# Patient Record
Sex: Female | Born: 1965 | Race: Black or African American | Hispanic: No | Marital: Single | Smoking: Never smoker
Health system: Southern US, Community
[De-identification: ages and names within clinical notes are randomized; demographics above are authoritative.]

## PROBLEM LIST (undated history)

## (undated) DIAGNOSIS — U099 Post covid-19 condition, unspecified: Secondary | ICD-10-CM

## (undated) DIAGNOSIS — R519 Headache, unspecified: Secondary | ICD-10-CM

## (undated) DIAGNOSIS — G478 Other sleep disorders: Secondary | ICD-10-CM

## (undated) DIAGNOSIS — R053 Chronic cough: Secondary | ICD-10-CM

## (undated) DIAGNOSIS — R05 Cough: Secondary | ICD-10-CM

## (undated) DIAGNOSIS — G8929 Other chronic pain: Secondary | ICD-10-CM

## (undated) DIAGNOSIS — J189 Pneumonia, unspecified organism: Secondary | ICD-10-CM

## (undated) DIAGNOSIS — M797 Fibromyalgia: Secondary | ICD-10-CM

## (undated) DIAGNOSIS — U071 COVID-19: Secondary | ICD-10-CM

## (undated) DIAGNOSIS — M1990 Unspecified osteoarthritis, unspecified site: Secondary | ICD-10-CM

## (undated) DIAGNOSIS — F419 Anxiety disorder, unspecified: Secondary | ICD-10-CM

## (undated) DIAGNOSIS — I10 Essential (primary) hypertension: Secondary | ICD-10-CM

## (undated) DIAGNOSIS — R0600 Dyspnea, unspecified: Secondary | ICD-10-CM

## (undated) DIAGNOSIS — R202 Paresthesia of skin: Secondary | ICD-10-CM

## (undated) DIAGNOSIS — K219 Gastro-esophageal reflux disease without esophagitis: Secondary | ICD-10-CM

## (undated) DIAGNOSIS — G039 Meningitis, unspecified: Secondary | ICD-10-CM

## (undated) DIAGNOSIS — R531 Weakness: Secondary | ICD-10-CM

## (undated) DIAGNOSIS — R52 Pain, unspecified: Secondary | ICD-10-CM

## (undated) DIAGNOSIS — E079 Disorder of thyroid, unspecified: Secondary | ICD-10-CM

## (undated) DIAGNOSIS — R51 Headache: Secondary | ICD-10-CM

## (undated) HISTORY — PX: OTHER SURGICAL HISTORY: SHX169

## (undated) HISTORY — PX: NO PAST SURGERIES: SHX2092

---

## 1998-02-02 ENCOUNTER — Emergency Department (HOSPITAL_COMMUNITY): Admission: EM | Admit: 1998-02-02 | Discharge: 1998-02-02 | Payer: Self-pay | Admitting: Emergency Medicine

## 1999-11-09 ENCOUNTER — Encounter: Admission: RE | Admit: 1999-11-09 | Discharge: 1999-11-09 | Payer: Self-pay | Admitting: Family Medicine

## 1999-11-09 ENCOUNTER — Encounter: Payer: Self-pay | Admitting: Family Medicine

## 2000-08-20 ENCOUNTER — Inpatient Hospital Stay (HOSPITAL_COMMUNITY): Admission: EM | Admit: 2000-08-20 | Discharge: 2000-08-23 | Payer: Self-pay | Admitting: *Deleted

## 2001-04-23 DIAGNOSIS — G971 Other reaction to spinal and lumbar puncture: Secondary | ICD-10-CM

## 2001-04-23 HISTORY — DX: Other reaction to spinal and lumbar puncture: G97.1

## 2001-06-23 ENCOUNTER — Inpatient Hospital Stay (HOSPITAL_COMMUNITY): Admission: EM | Admit: 2001-06-23 | Discharge: 2001-06-26 | Payer: Self-pay | Admitting: Emergency Medicine

## 2001-06-23 ENCOUNTER — Encounter: Payer: Self-pay | Admitting: Emergency Medicine

## 2001-06-30 ENCOUNTER — Emergency Department (HOSPITAL_COMMUNITY): Admission: EM | Admit: 2001-06-30 | Discharge: 2001-06-30 | Payer: Self-pay

## 2001-12-17 ENCOUNTER — Other Ambulatory Visit: Admission: RE | Admit: 2001-12-17 | Discharge: 2001-12-17 | Payer: Self-pay | Admitting: Obstetrics and Gynecology

## 2003-07-30 ENCOUNTER — Encounter: Admission: RE | Admit: 2003-07-30 | Discharge: 2003-07-30 | Payer: Self-pay | Admitting: Family Medicine

## 2007-01-29 ENCOUNTER — Encounter: Admission: RE | Admit: 2007-01-29 | Discharge: 2007-01-29 | Payer: Self-pay | Admitting: Family Medicine

## 2008-06-30 ENCOUNTER — Encounter: Admission: RE | Admit: 2008-06-30 | Discharge: 2008-06-30 | Payer: Self-pay | Admitting: Family Medicine

## 2009-09-12 ENCOUNTER — Encounter: Admission: RE | Admit: 2009-09-12 | Discharge: 2009-09-12 | Payer: Self-pay | Admitting: Family Medicine

## 2010-05-14 ENCOUNTER — Encounter: Payer: Self-pay | Admitting: Family Medicine

## 2010-07-11 ENCOUNTER — Other Ambulatory Visit (HOSPITAL_COMMUNITY): Payer: Self-pay | Admitting: Family Medicine

## 2010-07-11 DIAGNOSIS — R140 Abdominal distension (gaseous): Secondary | ICD-10-CM

## 2010-07-12 ENCOUNTER — Ambulatory Visit (HOSPITAL_COMMUNITY)
Admission: RE | Admit: 2010-07-12 | Discharge: 2010-07-12 | Disposition: A | Discharge status: Home / Self Care | Payer: PRIVATE HEALTH INSURANCE | Source: Ambulatory Visit | Attending: Family Medicine | Admitting: Family Medicine

## 2010-07-12 DIAGNOSIS — R143 Flatulence: Secondary | ICD-10-CM | POA: Insufficient documentation

## 2010-07-12 DIAGNOSIS — K7689 Other specified diseases of liver: Secondary | ICD-10-CM | POA: Insufficient documentation

## 2010-07-12 DIAGNOSIS — R109 Unspecified abdominal pain: Secondary | ICD-10-CM | POA: Insufficient documentation

## 2010-07-12 DIAGNOSIS — R141 Gas pain: Secondary | ICD-10-CM | POA: Insufficient documentation

## 2010-07-12 DIAGNOSIS — R140 Abdominal distension (gaseous): Secondary | ICD-10-CM

## 2010-07-12 DIAGNOSIS — R142 Eructation: Secondary | ICD-10-CM | POA: Insufficient documentation

## 2010-09-08 NOTE — Discharge Summary (Signed)
[facility]  Patient:    Donna Norton, Donna Norton             MRN: [id number] Adm. Date:  [id number] Disc. Date: [id number] Attending:  Pamelia Hoit                           Discharge Summary  ADMITTING DIAGNOSES: 1. Probable viral meningitis. 2. Headache. 3. Fibromyalgia. 4. Arthritis.  DISCHARGE DIAGNOSES: 1. Viral meningitis, resolved. 2. Headache, resolved. 3. Fibromyalgia. 4. Arthritis.  BRIEF ADMIT NOTE:  This is a 45 year old, married black female from [place] who felt fine two days prior to admission. She started having some abdominal cramps the evening of two days prior to admission, developed a severe headache and fever, some nausea, but no vomiting.  She felt this was different than the headache she had as a teenager. She had a stiff neck, unlike her fibromyalgia complaints. She was a little confused, developed a fever, forgot numbers, mixed up doctors office locations. She came to the emergency room for further evaluation. She was admitted to continue further evaluation.  PAST MEDICAL HISTORY:  History is remarkable for a fibromyalgia, remote history of abnormal Pap smears. No surgery. Childbirth x 3.  SOCIAL HISTORY:  No cigarettes or ETOH. Works at [employer].  The rest of the exam and past history can be obtained from the admission history and physical.  HOSPITAL COURSE:  The patient was admitted. Lumbar puncture was done by radiology and cultures were sent off appropriately as well as stains, etc. She was given IV fluids, started on IV Rocephin until cultures were back. The patients hospital course was one of gradual improvement. The day prior to discharge, she still was a little bit nauseated but was tolerating food okay. On the day of discharge, her headache had resolved. She had no further nausea. She felt essentially back to her normal self. It was deemed she was ready  for discharge.  LABORATORY DATA:  CSF cultures were negative x 3 days. Grams stain revealed a predominantly mononuclear WBCs. Her serum WBC was 8600 with 41 polys, and 47 lymphs (right shift). White count at time of admission was 12,100 but had decreased to 8600 the day prior to discharge. Glucose was minimally elevated at 124, but she was receiving IV fluids. Electrolytes were all normal. Liver functions were normal. CK/MBs were normal. CSF was clear and colorless, 7 RBCs, 988 WBCs with 97% lymphs. CSF protein was 199 and glucose was 48. AFB culture and smear showed no acid-fast bacilli present. Culture will be held for six weeks.  DISPOSITION:  The patient is discharged to home. She will continue to take her Paxil 20 mg q.d. and Celebrex 200 q.d. There are no new medications. She will not be on any antibiotics at time of discharge, and may go back to work on Monday, Aug 26, 2000. She is to follow up with [facility] in the office next week if she is not doing better.  CONDITION AT DISCHARGE:  Markedly improved. DD:  08/23/00 TD:  08/24/00 Job: [id number]

## 2010-09-08 NOTE — Discharge Summary (Signed)
[facility]  Patient:    TARISHA, FADER Visit Number: [id number] MRN: [id number]          Service Type: EMS Location: ED Attending Physician:  Pearletha Alfred Dictated by:   Viviana Simpler, M.D. Admit Date:  06/30/2001 Discharge Date: 06/30/2001   CC:         Gloriajean Dell. Andrey Campanile, M.D.   Discharge Summary  DATE OF BIRTH:  June 03, 1964  DISCHARGE DIAGNOSES: 1. Aseptic meningitis. 2. Fibromyalgia.  DISCHARGE MEDICATIONS:  Patient is to resume her home dose of Paxil and is to take ibuprofen and Tylenol for residual headache.  FOLLOW-UP:  Patient is to make an appointment with Dr. Andrey Campanile if any of her symptoms should return.  HISTORY OF PRESENT ILLNESS:  Ms. Chanetta Marshall is a 45 year old woman in good health with a history of viral meningitis approximately one year ago who presented to the hospital with a one-day history of headache, stiff neck, fever, and generalized malaise.  Her recent history was unremarkable aside from a presumed pneumonia treated three months ago as an outpatient.  She enjoyed complete recovery from those symptoms and was well until the above constellation as described.  On arrival, temperature was 100.7, pulse 104, blood pressure 109/55 with respirations 24.  Neck was stiff.  Exam was otherwise unremarkable as described by Dr. Letitia Libra, admitting physician.  Labs notable for elevated white blood cell count of 13.5 with an ANC of 11.9.  Given concern about meningitis, patient underwent a lumbar puncture in the emergency department which revealed CSF containing 45 red blood cells, 228 white blood cells, 25% of which were neutrophils, 43% lymphocytes.  Glucose was 43 and total protein 125.  Gram stain was negative.  HOSPITAL COURSE:  Ms. Chanetta Marshall was hospitalized with viral versus early bacterial meningitis and was placed empirically on Rocephin 2 g IV q.24h. until CSF and blood cultures were finalized.  Her symptoms  were treated with analgesics and antipyretics and, over the course of hospitalization, she continued to improve.  Cultures were finalized and read as negative and, on the date of discharge, she was feeling remarkably well.  Interestingly, this represents her second episode of aseptic meningitis.  I described to her that there is no reason to suspect an immunodeficiency, but in some cases we see recurrences of meningitis suspected secondary to a localized compromise of the blood-brain barrier.  Ms. Chanetta Marshall has no other signs or symptoms that would be suggestive of an immunodeficiency. Reassurance was given and she was discharged in stable condition. Dictated by:   Viviana Simpler, M.D. Attending Physician:  [place]y [place] B DD:  07/31/01 TD:  08/01/01 Job: [id number]

## 2010-09-08 NOTE — H&P (Signed)
[facility]  Patient:    Donna Norton, Donna Norton             MRN: [id number] Adm. Date:  [id number] Attending:  Pamelia Hoit                         History and Physical  IDENTIFICATION:  A 45 year old, married, black female from [place], [place].  CHIEF COMPLAINT:  Abdominal pain, severe headache, nausea, and fever started one day ago.  HISTORY OF PRESENT ILLNESS:  The patient had been in good health and feeling well until two days in the evening she had lower abdominal cramps that lasted for two hours and then resolved.  Yesterday morning, early, she had a severe headache, fever, nausea, and no vomiting.  This was different from migraine headaches she had as a teenager.  She felt stiffness in her neck and down the spine and there was also pain, which was unlike her usual fibromyalgia pain. She had some confusion, forgetting familiar phone numbers and getting mixed up where her doctors office was located.  REVIEW OF SYSTEMS:  Otherwise negative for diarrhea, bladder or kidney symptoms, joint pains, rash, or trauma.  No known exposures.  PAST MEDICAL HISTORY:  Last period was about two weeks ago and was normal.  MEDICATIONS: 1. Celebrex 200 mg q.d. for arthritis in the legs.  She says this is    rheumatoid arthritis. 2. Paxil 20 mg q.d. for fibromyalgia.  ALLERGIES:  No known drug allergies.  PAST SURGICAL HISTORY:  Remote history of abnormal Pap smears.  She has never had surgery.  SOCIAL HISTORY:  She has had three children.  Does not smoke cigarettes or drink alcohol.  She works at [employer].  PHYSICAL EXAMINATION:  GENERAL:  Slightly overweight, black female, sleepy, but nontoxic.  Her speech is clear.  She is alert and oriented and appropriate.  She is lying in bed able to move around without excessive apparent pain.  SKIN:  She has no skin rash.  VITAL SIGNS:  Afebrile, blood pressure  132/82, pulse 100, respirations 20.  HEENT:  TMs and ear canals are normal.  Pupils equal and reactive to light with normal eye movements.  Both of these procedures cause pain.  Throat is normal and nose is normal.  Sinuses nontender.  NECK:  There is tenderness of the neck and apparent pain with forward neck flexion, but not extremely tense or guarding.  She can rotate her neck without apparent pain.  There is no adenopathy or thyroid enlargement of the neck.  LUNGS:  Clear to auscultation.  HEART:  Regular without murmurs.  ABDOMEN:  Soft, nontender with normal bowel sounds.  No masses.  BACK:  Slight midline tenderness.  EXTREMITIES:  Moves all extremities well with no tenderness in the muscles or joints.  There is no peripheral edema.  Straight leg raising causes no back or neck pain.  X-RAY:  CT scan of head without contrast is normal.  LABORATORY:  CBC:  White count 12,100, hemoglobin 14.8, platelets 273,000, 83% neutrophils, 14% lymphocytes, and 3% monocytes.  Electrolytes are normal. Serum glucose is 124.  CSF obtained with x-ray guidance shows clear, colorless CSF with 988 white cells, 7 rbcs.  Differential 97% lymphocytes, 1% neutrophils, and 2% monocytes.  CSF glucose 48.  CSF protein is elevated.  ASSESSMENT:  Viral meningitis with headache, nausea.  Fibromyalgia of some kind of arthritis which is not  active.  PLAN:  Pain control, nausea control.  Rocephin IV while CSF culture is pending.  Continue Paxil at same dose.  Clear liquids p.o., advancing diet as tolerated. DD:  08/20/00 TD:  08/21/00 Job: [id number]

## 2011-03-07 ENCOUNTER — Other Ambulatory Visit: Payer: Self-pay | Admitting: Family Medicine

## 2011-03-07 ENCOUNTER — Ambulatory Visit
Admission: RE | Admit: 2011-03-07 | Discharge: 2011-03-07 | Disposition: A | Discharge status: Home / Self Care | Payer: BC Managed Care – PPO | Source: Ambulatory Visit | Attending: Family Medicine | Admitting: Family Medicine

## 2011-03-07 ENCOUNTER — Ambulatory Visit
Admission: RE | Admit: 2011-03-07 | Discharge: 2011-03-07 | Disposition: A | Discharge status: Home / Self Care | Payer: PRIVATE HEALTH INSURANCE | Source: Ambulatory Visit | Attending: Family Medicine | Admitting: Family Medicine

## 2011-03-07 DIAGNOSIS — R1900 Intra-abdominal and pelvic swelling, mass and lump, unspecified site: Secondary | ICD-10-CM

## 2011-04-12 ENCOUNTER — Other Ambulatory Visit: Payer: Self-pay | Admitting: Family Medicine

## 2011-04-12 DIAGNOSIS — M549 Dorsalgia, unspecified: Secondary | ICD-10-CM

## 2011-04-13 ENCOUNTER — Other Ambulatory Visit: Payer: BC Managed Care – PPO

## 2011-04-19 ENCOUNTER — Other Ambulatory Visit (HOSPITAL_COMMUNITY): Payer: Self-pay | Admitting: Endocrinology

## 2011-04-19 DIAGNOSIS — E0590 Thyrotoxicosis, unspecified without thyrotoxic crisis or storm: Secondary | ICD-10-CM

## 2011-04-20 ENCOUNTER — Other Ambulatory Visit: Payer: BC Managed Care – PPO

## 2011-05-03 ENCOUNTER — Encounter (HOSPITAL_COMMUNITY)
Admission: RE | Admit: 2011-05-03 | Discharge: 2011-05-03 | Disposition: A | Discharge status: Home / Self Care | Payer: BC Managed Care – PPO | Source: Ambulatory Visit | Attending: Endocrinology | Admitting: Endocrinology

## 2011-05-03 DIAGNOSIS — E0590 Thyrotoxicosis, unspecified without thyrotoxic crisis or storm: Secondary | ICD-10-CM

## 2011-05-04 ENCOUNTER — Encounter (HOSPITAL_COMMUNITY)
Admission: RE | Admit: 2011-05-04 | Discharge: 2011-05-04 | Disposition: A | Discharge status: Home / Self Care | Payer: BC Managed Care – PPO | Source: Ambulatory Visit | Attending: Endocrinology | Admitting: Endocrinology

## 2011-05-04 DIAGNOSIS — E0590 Thyrotoxicosis, unspecified without thyrotoxic crisis or storm: Secondary | ICD-10-CM | POA: Insufficient documentation

## 2011-05-04 MED ORDER — SODIUM IODIDE I 131 CAPSULE: 9.4000 | Freq: Once | INTRAVENOUS | Status: AC | PRN

## 2011-05-04 MED ORDER — SODIUM PERTECHNETATE TC 99M INJECTION
10.0000 | Freq: Once | INTRAVENOUS | Status: AC | PRN
  Administered 2011-05-04: 10 via INTRAVENOUS

## 2011-05-17 ENCOUNTER — Other Ambulatory Visit: Payer: Self-pay | Admitting: Endocrinology

## 2011-05-17 DIAGNOSIS — E0590 Thyrotoxicosis, unspecified without thyrotoxic crisis or storm: Secondary | ICD-10-CM

## 2011-05-21 ENCOUNTER — Other Ambulatory Visit: Payer: BC Managed Care – PPO

## 2011-05-23 ENCOUNTER — Ambulatory Visit
Admission: RE | Admit: 2011-05-23 | Discharge: 2011-05-23 | Disposition: A | Discharge status: Home / Self Care | Payer: BC Managed Care – PPO | Source: Ambulatory Visit | Attending: Endocrinology | Admitting: Endocrinology

## 2011-05-23 DIAGNOSIS — E0590 Thyrotoxicosis, unspecified without thyrotoxic crisis or storm: Secondary | ICD-10-CM

## 2012-02-20 ENCOUNTER — Emergency Department (HOSPITAL_COMMUNITY): Payer: Self-pay

## 2012-02-20 ENCOUNTER — Encounter (HOSPITAL_COMMUNITY): Payer: Self-pay | Admitting: Emergency Medicine

## 2012-02-20 ENCOUNTER — Emergency Department (HOSPITAL_COMMUNITY)
Admission: EM | Admit: 2012-02-20 | Discharge: 2012-02-20 | Disposition: A | Discharge status: Home / Self Care | Payer: Self-pay | Attending: Emergency Medicine | Admitting: Emergency Medicine

## 2012-02-20 DIAGNOSIS — Z862 Personal history of diseases of the blood and blood-forming organs and certain disorders involving the immune mechanism: Secondary | ICD-10-CM | POA: Insufficient documentation

## 2012-02-20 DIAGNOSIS — Z8661 Personal history of infections of the central nervous system: Secondary | ICD-10-CM | POA: Insufficient documentation

## 2012-02-20 DIAGNOSIS — R05 Cough: Secondary | ICD-10-CM | POA: Insufficient documentation

## 2012-02-20 DIAGNOSIS — R059 Cough, unspecified: Secondary | ICD-10-CM | POA: Insufficient documentation

## 2012-02-20 DIAGNOSIS — B9789 Other viral agents as the cause of diseases classified elsewhere: Secondary | ICD-10-CM | POA: Insufficient documentation

## 2012-02-20 DIAGNOSIS — Z7982 Long term (current) use of aspirin: Secondary | ICD-10-CM | POA: Insufficient documentation

## 2012-02-20 DIAGNOSIS — IMO0001 Reserved for inherently not codable concepts without codable children: Secondary | ICD-10-CM | POA: Insufficient documentation

## 2012-02-20 DIAGNOSIS — Z8639 Personal history of other endocrine, nutritional and metabolic disease: Secondary | ICD-10-CM | POA: Insufficient documentation

## 2012-02-20 DIAGNOSIS — R110 Nausea: Secondary | ICD-10-CM | POA: Insufficient documentation

## 2012-02-20 DIAGNOSIS — B349 Viral infection, unspecified: Secondary | ICD-10-CM

## 2012-02-20 DIAGNOSIS — Z79899 Other long term (current) drug therapy: Secondary | ICD-10-CM | POA: Insufficient documentation

## 2012-02-20 HISTORY — DX: Meningitis, unspecified: G03.9

## 2012-02-20 HISTORY — DX: Disorder of thyroid, unspecified: E07.9

## 2012-02-20 HISTORY — DX: Fibromyalgia: M79.7

## 2012-02-20 MED ORDER — OXYCODONE-ACETAMINOPHEN 5-325 MG PO TABS
2.0000 | ORAL_TABLET | Freq: Once | ORAL | Status: AC
  Administered 2012-02-20: 2 via ORAL
  Filled 2012-02-20: qty 2

## 2012-02-20 MED ORDER — OXYCODONE-ACETAMINOPHEN 5-325 MG PO TABS: 1.0000 | ORAL_TABLET | ORAL | Status: DC | PRN

## 2012-02-20 NOTE — ED Provider Notes (Signed)
History     CSN: [id number]  Arrival date & time 02/20/12  0351   First MD Initiated Contact with Patient 02/20/12 [phone]      Chief Complaint  Patient presents with  . Chills    (Consider location/radiation/quality/duration/timing/severity/associated sxs/prior treatment) HPI Donna Norton is a 46 y.o. female presenting with HA that started gradually 2 nights ago.  IT is throbbing, at the crown of the head, is severe, was worse last night, minimla improvement with OTC medications.  This has been associated with nausea and no vomiting, no diarrhea, no fever.  Pt says this is similar to prior migraines, but has also had a dry cough for 4-5 days.  Also c/o neck soreness with good neck mobility.  The cough exacerbates her headache and neck pain.   Past Medical History  Diagnosis Date  . Fibromyalgia   . Meningitis   . Thyroid disease     History reviewed. No pertinent past surgical history.  No family history on file.  History  Substance Use Topics  . Smoking status: Never Smoker   . Smokeless tobacco: Not on file  . Alcohol Use: No    OB History    Grav Para Term Preterm Abortions TAB SAB Ect Mult Living                  Review of Systems At least 10pt or greater review of systems completed and are negative except where specified in the HPI.  Allergies  Methimazole  Home Medications   Current Outpatient Rx  Name Route Sig Dispense Refill  . ASPIRIN-ACETAMINOPHEN-CAFFEINE 250-250-65 MG PO TABS Oral Take 1 tablet by mouth every 6 (six) hours as needed. For headache    . CELECOXIB 200 MG PO CAPS Oral Take 200 mg by mouth 2 (two) times daily.    . DULOXETINE HCL 30 MG PO CPEP Oral Take 30 mg by mouth daily.    Marland Kitchen NAPROXEN SODIUM 220 MG PO TABS Oral Take 220 mg by mouth 2 (two) times daily with a meal.      BP 135/79  Pulse 97  Temp 98.3 F (36.8 C) (Oral)  Resp 18  SpO2 97%  Physical Exam  Nursing notes reviewed.  Electronic medical record  reviewed. VITAL SIGNS:   Filed Vitals:   02/20/12 0430 02/20/12 0445 02/20/12 0458 02/20/12 0625  BP: 115/77 130/81 130/81 104/67  Pulse: 92 82 88 79  Temp:    98.2 F (36.8 C)  TempSrc:    Oral  Resp: 19 22 18 18   SpO2: 99% 100% 99% 96%   CONSTITUTIONAL: Awake, oriented, appears non-toxic HENT: Atraumatic, normocephalic, oral mucosa pink and moist, airway patent. Nares patent without drainage. External ears normal. EYES: Conjunctiva clear, EOMI, PERRLA NECK: Trachea midline, non-tender, supple CARDIOVASCULAR: Normal heart rate, Normal rhythm, No murmurs, rubs, gallops PULMONARY/CHEST: Clear to auscultation, no rhonchi, wheezes, or rales. Symmetrical breath sounds. Non-tender. ABDOMINAL: Non-distended, soft, non-tender - no rebound or guarding.  BS normal. NEUROLOGIC: Non-focal, moving all four extremities, no gross sensory or motor deficits. EXTREMITIES: No clubbing, cyanosis, or edema SKIN: Warm, Dry, No erythema, No rash  ED Course  Procedures (including critical care time)  Labs Reviewed - No data to display Dg Chest 2 View  02/20/2012  *RADIOLOGY REPORT*  Clinical Data: Chills.  CHEST - 2 VIEW  Comparison: PA and lateral chest 07/12/2009.  Findings: Lungs clear.  Heart size normal.  No pneumothorax or pleural fluid.  IMPRESSION: Negative chest.   Original  Report Authenticated By: Bernadene Bell. D'ALESSIO, M.D.      1. Viral syndrome      Medications  DULoxetine (CYMBALTA) 30 MG capsule (not administered)  celecoxib (CELEBREX) 200 MG capsule (not administered)  naproxen sodium (ANAPROX) 220 MG tablet (not administered)  aspirin-acetaminophen-caffeine (EXCEDRIN MIGRAINE) 250-250-65 MG per tablet (not administered)  oxyCODONE-acetaminophen (PERCOCET/ROXICET) 5-325 MG per tablet (not administered)  oxyCODONE-acetaminophen (PERCOCET/ROXICET) 5-325 MG per tablet 2 tablet (2 tablet Oral Given 02/20/12 0456)     MDM  Donna Norton is a 46 y.o. female presents with likely  viral syndrome with dry cough and headache.  Likely migraine touched off with frequenct cough.  CXR shows no PNA,  Low suspicion for meningitis as pt is afebrile, appears well in NAD, non-toxic and has a supple neck.  LP not indicated.  Pt feeling much better after medication.  DC with few percocet.  I explained the diagnosis and have given explicit precautions to return to the ER including worsening headache, neurologic deficits, chest pain or any other new or worsening symptoms. The patient understands and accepts the medical plan as it's been dictated and I have answered their questions. Discharge instructions concerning home care and prescriptions have been given.  The patient is STABLE and is discharged to home in good condition.         Jones Skene, MD 02/21/12 1348

## 2012-02-20 NOTE — ED Notes (Signed)
PT. REPORTS CHILLS , MIGRAINE HEADACHE WITH BODY ACHES / GENERALIZED BODY ACHES ONSET YESTERDAY.

## 2012-02-20 NOTE — ED Notes (Signed)
EKG ordered per protocol due to patient complaining of palpitations.

## 2012-02-20 NOTE — ED Notes (Signed)
Patient currently sitting up in bed; no respiratory or acute distress noted.  Patient updated on plan of care; informed patient that she will be sent for a chest x-ray; patient denies any other needs at this time. Will continue to monitor.

## 2012-02-20 NOTE — ED Notes (Signed)
Patient given copy of discharge paperwork; went over discharge instructions with patient.  Patient instructed to take Percocet as directed, to not drive or drink while taking Percocet, to follow up with primary care physician/referral (to keep appointment on the 6th), to drink plenty of fluids/get plenty of rest, and to return to the ED for new, worsening, or concerning symptoms.

## 2012-06-02 ENCOUNTER — Other Ambulatory Visit (HOSPITAL_COMMUNITY): Payer: Self-pay | Admitting: Family Medicine

## 2012-06-02 DIAGNOSIS — Z1231 Encounter for screening mammogram for malignant neoplasm of breast: Secondary | ICD-10-CM

## 2012-06-17 ENCOUNTER — Ambulatory Visit (HOSPITAL_COMMUNITY)
Admission: RE | Admit: 2012-06-17 | Discharge: 2012-06-17 | Disposition: A | Discharge status: Home / Self Care | Payer: Self-pay | Source: Ambulatory Visit | Attending: Family Medicine | Admitting: Family Medicine

## 2012-06-17 DIAGNOSIS — Z1231 Encounter for screening mammogram for malignant neoplasm of breast: Secondary | ICD-10-CM

## 2013-02-19 ENCOUNTER — Emergency Department (HOSPITAL_COMMUNITY)
Admission: EM | Admit: 2013-02-19 | Discharge: 2013-02-19 | Disposition: A | Discharge status: Home / Self Care | Payer: No Typology Code available for payment source | Attending: Emergency Medicine | Admitting: Emergency Medicine

## 2013-02-19 ENCOUNTER — Emergency Department (HOSPITAL_COMMUNITY): Payer: No Typology Code available for payment source

## 2013-02-19 ENCOUNTER — Encounter (HOSPITAL_COMMUNITY): Payer: Self-pay | Admitting: Emergency Medicine

## 2013-02-19 DIAGNOSIS — R091 Pleurisy: Secondary | ICD-10-CM | POA: Insufficient documentation

## 2013-02-19 DIAGNOSIS — G8929 Other chronic pain: Secondary | ICD-10-CM | POA: Insufficient documentation

## 2013-02-19 DIAGNOSIS — R0602 Shortness of breath: Secondary | ICD-10-CM | POA: Insufficient documentation

## 2013-02-19 DIAGNOSIS — IMO0001 Reserved for inherently not codable concepts without codable children: Secondary | ICD-10-CM | POA: Insufficient documentation

## 2013-02-19 DIAGNOSIS — Z791 Long term (current) use of non-steroidal anti-inflammatories (NSAID): Secondary | ICD-10-CM | POA: Insufficient documentation

## 2013-02-19 DIAGNOSIS — M797 Fibromyalgia: Secondary | ICD-10-CM

## 2013-02-19 DIAGNOSIS — R5381 Other malaise: Secondary | ICD-10-CM | POA: Insufficient documentation

## 2013-02-19 DIAGNOSIS — R110 Nausea: Secondary | ICD-10-CM | POA: Insufficient documentation

## 2013-02-19 DIAGNOSIS — R42 Dizziness and giddiness: Secondary | ICD-10-CM | POA: Insufficient documentation

## 2013-02-19 DIAGNOSIS — E669 Obesity, unspecified: Secondary | ICD-10-CM | POA: Insufficient documentation

## 2013-02-19 DIAGNOSIS — M129 Arthropathy, unspecified: Secondary | ICD-10-CM | POA: Insufficient documentation

## 2013-02-19 LAB — CBC WITH DIFFERENTIAL/PLATELET
Basophils Absolute: 0 10*3/uL (ref 0.0–0.1)
Basophils Relative: 0 % (ref 0–1)
Eosinophils Absolute: 0.2 10*3/uL (ref 0.0–0.7)
Eosinophils Relative: 2 % (ref 0–5)
HCT: 45.6 % (ref 36.0–46.0)
Hemoglobin: 16.3 g/dL — ABNORMAL HIGH (ref 12.0–15.0)
Lymphocytes Relative: 40 % (ref 12–46)
Lymphs Abs: 3.6 10*3/uL (ref 0.7–4.0)
MCH: 31.7 pg (ref 26.0–34.0)
MCHC: 35.7 g/dL (ref 30.0–36.0)
MCV: 88.5 fL (ref 78.0–100.0)
Monocytes Absolute: 0.5 10*3/uL (ref 0.1–1.0)
Monocytes Relative: 5 % (ref 3–12)
Neutro Abs: 4.7 10*3/uL (ref 1.7–7.7)
Neutrophils Relative %: 53 % (ref 43–77)
Platelets: 235 10*3/uL (ref 150–400)
RBC: 5.15 MIL/uL — ABNORMAL HIGH (ref 3.87–5.11)
RDW: 12.1 % (ref 11.5–15.5)
WBC: 9 10*3/uL (ref 4.0–10.5)

## 2013-02-19 LAB — COMPREHENSIVE METABOLIC PANEL
ALT: 16 U/L (ref 0–35)
AST: 20 U/L (ref 0–37)
Albumin: 4.1 g/dL (ref 3.5–5.2)
Alkaline Phosphatase: 85 U/L (ref 39–117)
BUN: 15 mg/dL (ref 6–23)
CO2: 25 mEq/L (ref 19–32)
Calcium: 9.4 mg/dL (ref 8.4–10.5)
Chloride: 101 mEq/L (ref 96–112)
Creatinine, Ser: 0.62 mg/dL (ref 0.50–1.10)
GFR calc Af Amer: >90 mL/min (ref >90)
GFR calc non Af Amer: >90 mL/min (ref >90)
Glucose, Bld: 88 mg/dL (ref 70–99)
Potassium: 3.9 mEq/L (ref 3.5–5.1)
Sodium: 138 mEq/L (ref 135–145)
Total Bilirubin: 0.8 mg/dL (ref 0.3–1.2)
Total Protein: 8.1 g/dL (ref 6.0–8.3)

## 2013-02-19 LAB — POCT I-STAT TROPONIN I: Troponin i, poc: 0 ng/mL (ref 0.00–0.08)

## 2013-02-19 LAB — D-DIMER, QUANTITATIVE: D-Dimer, Quant: 0.41 ug/mL-FEU (ref 0.00–0.48)

## 2013-02-19 LAB — LIPASE, BLOOD: Lipase: 38 U/L (ref 11–59)

## 2013-02-19 MED ORDER — KETOROLAC TROMETHAMINE 30 MG/ML IJ SOLN: 30.0000 mg | Freq: Once | INTRAMUSCULAR | Status: DC

## 2013-02-19 MED ORDER — ASPIRIN 81 MG PO CHEW
324.0000 mg | CHEWABLE_TABLET | Freq: Once | ORAL | Status: AC
  Administered 2013-02-19: 324 mg via ORAL
  Filled 2013-02-19: qty 4

## 2013-02-19 MED ORDER — ONDANSETRON HCL 4 MG/2ML IJ SOLN: 4.0000 mg | Freq: Once | INTRAMUSCULAR | Status: DC

## 2013-02-19 NOTE — ED Notes (Signed)
The pt is c/o pain just under her lt breast that she has had for 2-3 weeks.  The pain is rep[orduced by touch.  alert

## 2013-02-19 NOTE — ED Provider Notes (Signed)
Medical screening examination/treatment/procedure(s) were performed by non-physician practitioner and as supervising physician I was immediately available for consultation/collaboration.    Dione Booze, MD 02/19/13 [phone]

## 2013-02-19 NOTE — ED Provider Notes (Signed)
CSN: [id number]     Arrival date & time 02/19/13  0551 History   First MD Initiated Contact with Patient 02/19/13 [phone]     Chief Complaint  Patient presents with  . pain under her breast    (Consider location/radiation/quality/duration/timing/severity/associated sxs/prior Treatment) HPI Comments: Patient with history of fibromyalgia and some chronic pain presents with left sided chest pain.  She states that the pain has been going on for about 1 year, she has seen her PCP, [facility] in [place] and that they did not think the pain was anything.  She states that over the past three weeks the pain has increased and worsened.  She points to below her left breast and lateral chest.  The pain is worse with movement and palpation of the chest wall, she reports no nausea but episodes where she vomits up into her mouth but swallows this down.  She reports shortness of breath, lightheadedness and fatigue with the pain.  No history of CAD, cancer, PE, DVT.    Patient is a 47 y.o. female presenting with chest pain. The history is provided by the patient. No language interpreter was used.  Chest Pain Pain location:  L chest Pain quality: sharp, shooting and stabbing   Pain radiates to:  Does not radiate Pain radiates to the back: no   Pain severity:  Severe Onset quality:  Sudden Timing:  Constant Progression:  Worsening Chronicity:  Chronic Context: breathing and movement   Context: not eating, no stress and no trauma   Relieved by:  Nothing Worsened by:  Deep breathing and movement Associated symptoms: dizziness, fatigue, nausea, shortness of breath and weakness   Associated symptoms: no abdominal pain, no anorexia, no anxiety, no back pain, no cough, no diaphoresis, no headache, no heartburn, no lower extremity edema, no near-syncope, no numbness, no orthopnea, no PND, no syncope and not vomiting   Risk factors: obesity   Risk factors: no coronary artery disease, no hypertension, no prior  DVT/PE, no smoking and no surgery     Past Medical History  Diagnosis Date  . Fibromyalgia   . Meningitis   . Thyroid disease    History reviewed. No pertinent past surgical history. No family history on file. History  Substance Use Topics  . Smoking status: Never Smoker   . Smokeless tobacco: Not on file  . Alcohol Use: No   OB History   Grav Para Term Preterm Abortions TAB SAB Ect Mult Living                 Review of Systems  Constitutional: Positive for fatigue. Negative for diaphoresis.  Respiratory: Positive for shortness of breath. Negative for cough.   Cardiovascular: Positive for chest pain. Negative for orthopnea, syncope, PND and near-syncope.  Gastrointestinal: Positive for nausea. Negative for heartburn, vomiting, abdominal pain and anorexia.  Musculoskeletal: Negative for back pain.  Neurological: Positive for dizziness and weakness. Negative for numbness and headaches.  All other systems reviewed and are negative.    Allergies  Methimazole  Home Medications   Current Outpatient Rx  Name  Route  Sig  Dispense  Refill  . celecoxib (CELEBREX) 200 MG capsule   Oral   Take 200 mg by mouth daily.          . Multiple Vitamins-Minerals (MULTIVITAMIN & MINERAL PO)   Oral   Take 1 tablet by mouth every morning.         . naproxen sodium (ANAPROX) 220 MG tablet   Oral  Take 220 mg by mouth at bedtime as needed.           BP 148/93  Pulse 79  Temp(Src) 97.7 F (36.5 C) (Oral)  Resp 18  SpO2 98% Physical Exam  Nursing note and vitals reviewed. Constitutional: She is oriented to person, place, and time. She appears well-developed and well-nourished. No distress.  HENT:  Head: Normocephalic and atraumatic.  Right Ear: External ear normal.  Left Ear: External ear normal.  Nose: Nose normal.  Mouth/Throat: Oropharynx is clear and moist. No oropharyngeal exudate.  Eyes: Conjunctivae are normal. Pupils are equal, round, and reactive to light. No  scleral icterus.  Neck: Normal range of motion. Neck supple.  Cardiovascular: Normal rate, regular rhythm and normal heart sounds.  Exam reveals no gallop and no friction rub.   No murmur heard. Pulmonary/Chest: Effort normal and breath sounds normal. No respiratory distress. She has no wheezes. She has no rales. She exhibits tenderness.    Abdominal: Soft. Bowel sounds are normal. She exhibits no distension and no mass. There is no tenderness. There is no rebound and no guarding.  Musculoskeletal: Normal range of motion. She exhibits no edema and no tenderness.  Lymphadenopathy:    She has no cervical adenopathy.  Neurological: She is alert and oriented to person, place, and time. She exhibits normal muscle tone. Coordination normal.  Skin: Skin is warm and dry. No rash noted. No erythema. No pallor.  Psychiatric: She has a normal mood and affect. Her behavior is normal. Judgment and thought content normal.    ED Course  Procedures (including critical care time) Labs Review Labs Reviewed  CBC WITH DIFFERENTIAL  COMPREHENSIVE METABOLIC PANEL  LIPASE, BLOOD  D-DIMER, QUANTITATIVE   Imaging Review No results found.  EKG Interpretation   None      Results for orders placed during the hospital encounter of 02/19/13  CBC WITH DIFFERENTIAL      Result Value Range   WBC 9.0  4.0 - 10.5 K/uL   RBC 5.15 (*) 3.87 - 5.11 MIL/uL   Hemoglobin 16.3 (*) 12.0 - 15.0 g/dL   HCT 16.1  09.6 - 04.5 %   MCV 88.5  78.0 - 100.0 fL   MCH 31.7  26.0 - 34.0 pg   MCHC 35.7  30.0 - 36.0 g/dL   RDW 40.9  81.1 - 91.4 %   Platelets 235  150 - 400 K/uL   Neutrophils Relative % 53  43 - 77 %   Neutro Abs 4.7  1.7 - 7.7 K/uL   Lymphocytes Relative 40  12 - 46 %   Lymphs Abs 3.6  0.7 - 4.0 K/uL   Monocytes Relative 5  3 - 12 %   Monocytes Absolute 0.5  0.1 - 1.0 K/uL   Eosinophils Relative 2  0 - 5 %   Eosinophils Absolute 0.2  0.0 - 0.7 K/uL   Basophils Relative 0  0 - 1 %   Basophils Absolute  0.0  0.0 - 0.1 K/uL  COMPREHENSIVE METABOLIC PANEL      Result Value Range   Sodium 138  135 - 145 mEq/L   Potassium 3.9  3.5 - 5.1 mEq/L   Chloride 101  96 - 112 mEq/L   CO2 25  19 - 32 mEq/L   Glucose, Bld 88  70 - 99 mg/dL   BUN 15  6 - 23 mg/dL   Creatinine, Ser 7.82  0.50 - 1.10 mg/dL   Calcium 9.4  8.4 -  10.5 mg/dL   Total Protein 8.1  6.0 - 8.3 g/dL   Albumin 4.1  3.5 - 5.2 g/dL   AST 20  0 - 37 U/L   ALT 16  0 - 35 U/L   Alkaline Phosphatase 85  39 - 117 U/L   Total Bilirubin 0.8  0.3 - 1.2 mg/dL   GFR calc non Af Amer >90  >90 mL/min   GFR calc Af Amer >90  >90 mL/min  LIPASE, BLOOD      Result Value Range   Lipase 38  11 - 59 U/L  D-DIMER, QUANTITATIVE      Result Value Range   D-Dimer, Quant 0.41  0.00 - 0.48 ug/mL-FEU  POCT I-STAT TROPONIN I      Result Value Range   Troponin i, poc 0.00  0.00 - 0.08 ng/mL   Comment 3            Dg Chest 2 View  02/19/2013   CLINICAL DATA:  Chest pain. Short of breath.  EXAM: CHEST  2 VIEW  COMPARISON:  02/20/2012.  FINDINGS: Cardiopericardial silhouette within normal limits. Mediastinal contours normal. Trachea midline. No airspace disease or effusion. The density over the right hemidiaphragm is favored to represent atelectasis and is only seen on the frontal view.  IMPRESSION: No acute cardiopulmonary disease.   Electronically Signed   By: Andreas Newport M.D.   On: 02/19/2013 07:59     Date: 02/19/2013  Rate: 73  Rhythm: Sinus  QRS Axis: 50, normal  Intervals: PR 163, QT 406  ST/T Wave abnormalities: t wave flattening anterior leads  Conduction Disutrbances:none  Narrative Interpretation: Reviewed by Dr. Fonnie Jarvis  Old EKG Reviewed: None available  MDM  Pleurisy Fibromyalgia exacerbation  Patient with a history of chronic pain and has been followed by PCP for several months of left anterior chest wall pain.  No evidence to suggest cardiac picture, PE, pancreatitis, pulmonary mass.  Patient is medically stable and may  follow up with PCP.  I do not feel that narcotics are warranted at this time.  Izola Price Marisue Humble, [place] 02/19/13 [phone]

## 2013-02-19 NOTE — ED Notes (Signed)
Pt. States she does not want pain medicine or nausea medicine at this time.

## 2013-02-23 ENCOUNTER — Encounter (HOSPITAL_COMMUNITY): Payer: Self-pay | Admitting: Emergency Medicine

## 2013-02-23 ENCOUNTER — Emergency Department (HOSPITAL_COMMUNITY): Payer: No Typology Code available for payment source

## 2013-02-23 ENCOUNTER — Emergency Department (HOSPITAL_COMMUNITY)
Admission: EM | Admit: 2013-02-23 | Discharge: 2013-02-23 | Disposition: A | Discharge status: Home / Self Care | Payer: No Typology Code available for payment source | Attending: Emergency Medicine | Admitting: Emergency Medicine

## 2013-02-23 DIAGNOSIS — M79609 Pain in unspecified limb: Secondary | ICD-10-CM | POA: Insufficient documentation

## 2013-02-23 DIAGNOSIS — J189 Pneumonia, unspecified organism: Secondary | ICD-10-CM

## 2013-02-23 DIAGNOSIS — Z862 Personal history of diseases of the blood and blood-forming organs and certain disorders involving the immune mechanism: Secondary | ICD-10-CM | POA: Insufficient documentation

## 2013-02-23 DIAGNOSIS — IMO0001 Reserved for inherently not codable concepts without codable children: Secondary | ICD-10-CM | POA: Insufficient documentation

## 2013-02-23 DIAGNOSIS — R1084 Generalized abdominal pain: Secondary | ICD-10-CM | POA: Insufficient documentation

## 2013-02-23 DIAGNOSIS — J159 Unspecified bacterial pneumonia: Secondary | ICD-10-CM | POA: Insufficient documentation

## 2013-02-23 DIAGNOSIS — R110 Nausea: Secondary | ICD-10-CM | POA: Insufficient documentation

## 2013-02-23 DIAGNOSIS — M129 Arthropathy, unspecified: Secondary | ICD-10-CM | POA: Insufficient documentation

## 2013-02-23 DIAGNOSIS — Z8639 Personal history of other endocrine, nutritional and metabolic disease: Secondary | ICD-10-CM | POA: Insufficient documentation

## 2013-02-23 DIAGNOSIS — Z791 Long term (current) use of non-steroidal anti-inflammatories (NSAID): Secondary | ICD-10-CM | POA: Insufficient documentation

## 2013-02-23 DIAGNOSIS — Z8669 Personal history of other diseases of the nervous system and sense organs: Secondary | ICD-10-CM | POA: Insufficient documentation

## 2013-02-23 HISTORY — DX: Unspecified osteoarthritis, unspecified site: M19.90

## 2013-02-23 LAB — LIPASE, BLOOD: Lipase: 35 U/L (ref 11–59)

## 2013-02-23 LAB — COMPREHENSIVE METABOLIC PANEL
ALT: 15 U/L (ref 0–35)
AST: 18 U/L (ref 0–37)
Albumin: 4.1 g/dL (ref 3.5–5.2)
Alkaline Phosphatase: 91 U/L (ref 39–117)
BUN: 9 mg/dL (ref 6–23)
CO2: 22 mEq/L (ref 19–32)
Calcium: 9.9 mg/dL (ref 8.4–10.5)
Chloride: 99 mEq/L (ref 96–112)
Creatinine, Ser: 0.63 mg/dL (ref 0.50–1.10)
GFR calc Af Amer: >90 mL/min (ref >90)
GFR calc non Af Amer: >90 mL/min (ref >90)
Glucose, Bld: 102 mg/dL — ABNORMAL HIGH (ref 70–99)
Potassium: 4.3 mEq/L (ref 3.5–5.1)
Sodium: 137 mEq/L (ref 135–145)
Total Bilirubin: 0.9 mg/dL (ref 0.3–1.2)
Total Protein: 8.4 g/dL — ABNORMAL HIGH (ref 6.0–8.3)

## 2013-02-23 LAB — URINALYSIS, ROUTINE W REFLEX MICROSCOPIC
Bilirubin Urine: NEGATIVE
Glucose, UA: NEGATIVE mg/dL
Ketones, ur: NEGATIVE mg/dL
Leukocytes, UA: NEGATIVE
Nitrite: NEGATIVE
Protein, ur: NEGATIVE mg/dL
Specific Gravity, Urine: 1.013 (ref 1.005–1.030)
Urobilinogen, UA: 0.2 mg/dL (ref 0.0–1.0)
pH: 7 (ref 5.0–8.0)

## 2013-02-23 LAB — CBC WITH DIFFERENTIAL/PLATELET
Basophils Absolute: 0 10*3/uL (ref 0.0–0.1)
Basophils Relative: 0 % (ref 0–1)
Eosinophils Absolute: 0.3 10*3/uL (ref 0.0–0.7)
Eosinophils Relative: 2 % (ref 0–5)
HCT: 44.8 % (ref 36.0–46.0)
Hemoglobin: 16.4 g/dL — ABNORMAL HIGH (ref 12.0–15.0)
Lymphocytes Relative: 32 % (ref 12–46)
Lymphs Abs: 4.1 10*3/uL — ABNORMAL HIGH (ref 0.7–4.0)
MCH: 32.1 pg (ref 26.0–34.0)
MCHC: 36.6 g/dL — ABNORMAL HIGH (ref 30.0–36.0)
MCV: 87.7 fL (ref 78.0–100.0)
Monocytes Absolute: 0.9 10*3/uL (ref 0.1–1.0)
Monocytes Relative: 7 % (ref 3–12)
Neutro Abs: 7.5 10*3/uL (ref 1.7–7.7)
Neutrophils Relative %: 59 % (ref 43–77)
Platelets: 248 10*3/uL (ref 150–400)
RBC: 5.11 MIL/uL (ref 3.87–5.11)
RDW: 12.2 % (ref 11.5–15.5)
WBC: 12.8 10*3/uL — ABNORMAL HIGH (ref 4.0–10.5)

## 2013-02-23 LAB — URINE MICROSCOPIC-ADD ON

## 2013-02-23 LAB — POCT I-STAT TROPONIN I: Troponin i, poc: 0 ng/mL (ref 0.00–0.08)

## 2013-02-23 MED ORDER — HYDROCODONE-ACETAMINOPHEN 5-325 MG PO TABS: 1.0000 | ORAL_TABLET | ORAL | Status: DC | PRN

## 2013-02-23 MED ORDER — ONDANSETRON HCL 4 MG/2ML IJ SOLN
4.0000 mg | Freq: Once | INTRAMUSCULAR | Status: AC
  Administered 2013-02-23: 4 mg via INTRAVENOUS
  Filled 2013-02-23: qty 2

## 2013-02-23 MED ORDER — ONDANSETRON HCL 4 MG PO TABS: 4.0000 mg | ORAL_TABLET | Freq: Four times a day (QID) | ORAL | Status: DC

## 2013-02-23 MED ORDER — IOHEXOL 300 MG/ML  SOLN
25.0000 mL | Freq: Once | INTRAMUSCULAR | Status: AC | PRN
  Administered 2013-02-23: 25 mL via ORAL

## 2013-02-23 MED ORDER — LEVOFLOXACIN IN D5W 750 MG/150ML IV SOLN
750.0000 mg | Freq: Once | INTRAVENOUS | Status: AC
  Administered 2013-02-23: 750 mg via INTRAVENOUS
  Filled 2013-02-23: qty 150

## 2013-02-23 MED ORDER — LEVOFLOXACIN 750 MG PO TABS: ORAL_TABLET | ORAL | Status: DC

## 2013-02-23 MED ORDER — SODIUM CHLORIDE 0.9 % IV BOLUS (SEPSIS)
1000.0000 mL | Freq: Once | INTRAVENOUS | Status: AC
  Administered 2013-02-23: 1000 mL via INTRAVENOUS

## 2013-02-23 MED ORDER — IOHEXOL 350 MG/ML SOLN
80.0000 mL | Freq: Once | INTRAVENOUS | Status: AC | PRN
  Administered 2013-02-23: 100 mL via INTRAVENOUS

## 2013-02-23 MED ORDER — MORPHINE SULFATE 2 MG/ML IJ SOLN
2.0000 mg | Freq: Once | INTRAMUSCULAR | Status: AC
  Administered 2013-02-23: 2 mg via INTRAVENOUS
  Filled 2013-02-23: qty 1

## 2013-02-23 MED ORDER — KETOROLAC TROMETHAMINE 30 MG/ML IJ SOLN
30.0000 mg | Freq: Once | INTRAMUSCULAR | Status: AC
  Administered 2013-02-23: 30 mg via INTRAVENOUS
  Filled 2013-02-23: qty 1

## 2013-02-23 NOTE — ED Notes (Signed)
Pt here with increased shortness of breath.  Pt seen here on Thursday and diagnosed with pleurisy.  Pt also reporting RLQ abdominal pain.  Reports some nausea; denies vomiting and diarrhea. Pt also reporting tightness in head and ringing in ears.  Resps e/u.  Pt ambulatory at triage.

## 2013-02-23 NOTE — ED Notes (Signed)
Patient transported to CT 

## 2013-02-23 NOTE — ED Notes (Signed)
Pt returned from CT.  Up to RR for UA.

## 2013-02-23 NOTE — ED Provider Notes (Signed)
CSN: [id number]     Arrival date & time 02/23/13  0122 History   First MD Initiated Contact with Patient 02/23/13 0148     Chief Complaint  Patient presents with  . Shortness of Breath  . Abdominal Pain   (Consider location/radiation/quality/duration/timing/severity/associated sxs/prior Treatment) HPI This patient is a 47 yo woman with chronic extremity pain which, she says, is secondary to fibromyalgia. She presents with complaints of tightening in her head, chest pain and abdominal pain. She says "the pain underneath my breastbone has been ongoing for about a year". However, she was seen by Dr. Fonnie Jarvis in this ED 5d ago for this same pain which had apparently acutely worsened. She had a normal EKG, CXR, CBC, CMP and d dimer and was diagnosed, she says, with pleurisy.   However, she says that her pain is travelling into her abdomen. She has diffuse abdominal pain. She feels like her abdomen is bloated. She has been nauseated but, denies vomiting. NO diarrhea. NO fever. No cough. NO SOB. Patient says she feels weak all over. Denies GU sx.  The patient says that her pain is unbearable. She says she has an appt with her PCP at [facility] later today at 11:30am but that she couldn't wait until then because " anything could happen".    Past Medical History  Diagnosis Date  . Fibromyalgia   . Meningitis   . Thyroid disease   . Arthritis    History reviewed. No pertinent past surgical history. History reviewed. No pertinent family history. History  Substance Use Topics  . Smoking status: Never Smoker   . Smokeless tobacco: Not on file  . Alcohol Use: No   OB History   Grav Para Term Preterm Abortions TAB SAB Ect Mult Living                 Review of Systems In part review of systems obtained and is negative with the exception of symptoms noted above.   Allergies  Methimazole  Home Medications   Current Outpatient Rx  Name  Route  Sig  Dispense  Refill  .  celecoxib (CELEBREX) 200 MG capsule   Oral   Take 200 mg by mouth daily.          Marland Kitchen ibuprofen (ADVIL,MOTRIN) 200 MG tablet   Oral   Take 400 mg by mouth every 6 (six) hours as needed for pain.         . Multiple Vitamins-Minerals (MULTIVITAMIN & MINERAL PO)   Oral   Take 1 tablet by mouth every morning.         . naproxen sodium (ANAPROX) 220 MG tablet   Oral   Take 220 mg by mouth at bedtime as needed.           There were no vitals taken for this visit. Physical Exam Gen: well developed and well nourished appearing, appears to be sleeping when I enter the room but, easily arousable and then moaning in pain. Patient interrupted exam to answer a phone cell phone call and conducted a conversation on speaker phone for about 2 minutes.  At that time she did not appear to be in any distress. Yet, she began moaning again after she ended the telephone call.  Head: NCAT Eyes: PERL, EOMI Nose: no epistaixis or rhinorrhea Mouth/throat: mucosa is moist and pink Neck: no midline ttp Lungs: CTA B, no wheezing, rhonchi or rales Chest wall: ttp diffusely CV: RRR, no murmur Abd: soft, notender, nondistended  Back: no ttp, no cva ttp Skin: warm and dry Neuro: CN ii-xii grossly intact, no focal deficits, strong and symmetric motor strength both arms and legs.  Psyche; flat affect,  calm and cooperative.  Ext: normal to inspection, no edema, no ttp ED Course  Procedures (including critical care time) Labs Review  Results for orders placed during the hospital encounter of 02/23/13 (from the past 24 hour(s))  CBC WITH DIFFERENTIAL     Status: Abnormal   Collection Time    02/23/13  1:40 AM      Result Value Range   WBC 12.8 (*) 4.0 - 10.5 K/uL   RBC 5.11  3.87 - 5.11 MIL/uL   Hemoglobin 16.4 (*) 12.0 - 15.0 g/dL   HCT 16.1  09.6 - 04.5 %   MCV 87.7  78.0 - 100.0 fL   MCH 32.1  26.0 - 34.0 pg   MCHC 36.6 (*) 30.0 - 36.0 g/dL   RDW 40.9  81.1 - 91.4 %   Platelets 248  150 - 400  K/uL   Neutrophils Relative % 59  43 - 77 %   Neutro Abs 7.5  1.7 - 7.7 K/uL   Lymphocytes Relative 32  12 - 46 %   Lymphs Abs 4.1 (*) 0.7 - 4.0 K/uL   Monocytes Relative 7  3 - 12 %   Monocytes Absolute 0.9  0.1 - 1.0 K/uL   Eosinophils Relative 2  0 - 5 %   Eosinophils Absolute 0.3  0.0 - 0.7 K/uL   Basophils Relative 0  0 - 1 %   Basophils Absolute 0.0  0.0 - 0.1 K/uL  COMPREHENSIVE METABOLIC PANEL     Status: Abnormal   Collection Time    02/23/13  1:40 AM      Result Value Range   Sodium 137  135 - 145 mEq/L   Potassium 4.3  3.5 - 5.1 mEq/L   Chloride 99  96 - 112 mEq/L   CO2 22  19 - 32 mEq/L   Glucose, Bld 102 (*) 70 - 99 mg/dL   BUN 9  6 - 23 mg/dL   Creatinine, Ser 7.82  0.50 - 1.10 mg/dL   Calcium 9.9  8.4 - 95.6 mg/dL   Total Protein 8.4 (*) 6.0 - 8.3 g/dL   Albumin 4.1  3.5 - 5.2 g/dL   AST 18  0 - 37 U/L   ALT 15  0 - 35 U/L   Alkaline Phosphatase 91  39 - 117 U/L   Total Bilirubin 0.9  0.3 - 1.2 mg/dL   GFR calc non Af Amer >90  >90 mL/min   GFR calc Af Amer >90  >90 mL/min  LIPASE, BLOOD     Status: None   Collection Time    02/23/13  1:40 AM      Result Value Range   Lipase 35  11 - 59 U/L  POCT I-STAT TROPONIN I     Status: None   Collection Time    02/23/13  2:02 AM      Result Value Range   Troponin i, poc 0.00  0.00 - 0.08 ng/mL   Comment 3           URINALYSIS, ROUTINE W REFLEX MICROSCOPIC     Status: Abnormal   Collection Time    02/23/13  4:31 AM      Result Value Range   Color, Urine YELLOW  YELLOW   APPearance CLEAR  CLEAR  Specific Gravity, Urine 1.013  1.005 - 1.030   pH 7.0  5.0 - 8.0   Glucose, UA NEGATIVE  NEGATIVE mg/dL   Hgb urine dipstick SMALL (*) NEGATIVE   Bilirubin Urine NEGATIVE  NEGATIVE   Ketones, ur NEGATIVE  NEGATIVE mg/dL   Protein, ur NEGATIVE  NEGATIVE mg/dL   Urobilinogen, UA 0.2  0.0 - 1.0 mg/dL   Nitrite NEGATIVE  NEGATIVE   Leukocytes, UA NEGATIVE  NEGATIVE  URINE MICROSCOPIC-ADD ON     Status: Abnormal    Collection Time    02/23/13  4:31 AM      Result Value Range   Squamous Epithelial / LPF FEW (*) RARE   RBC / HPF 0-2  <3 RBC/hpf   Bacteria, UA RARE  RARE     EKG: nsr, no acute ischemic changes, normal intervals, normal axis, normal qrs complex  EXAM: CT ANGIOGRAPHY CHEST  CT ABDOMEN AND PELVIS WITH CONTRAST  TECHNIQUE: Multidetector CT imaging of the chest was performed using the standard protocol during bolus administration of intravenous contrast. Multiplanar CT image reconstructions including MIPs were obtained to evaluate the vascular anatomy. Multidetector CT imaging of the abdomen and pelvis was performed using the standard protocol during bolus administration of intravenous contrast.  CONTRAST: OMNIPAQUE IOHEXOL 350 MG/ML SOLN  COMPARISON: Chest radiograph February 23, 2013.  FINDINGS: CTA CHEST FINDINGS  Main pulmonary artery is not enlarged. No pulmonary arterial filling defects to the level of the subsegmental branches. Residual contrast in the superior vena cava may decrease sensitivity for subtle upper lobe pulmonary emboli.  Focal rounded the hypoenhancing consolidation right middle lobe measures approximately 16 x 14 mm with punctate calcification, abutting the diaphragmatic surface, axial 67/136. No pleural effusions, no pulmonary nodules or masses. Mild pulmonary vasculature congestion.  Thoracic aorta is normal in course and caliber. Common origin of the brachiocephalic artery and left common carotid artery. The heart is mildly enlarged, pericardium is unremarkable. Tracheobronchial tree is patent and midline. No lymphadenopathy by CT size criteria ; sub cm left axillary lymph node seen. Osseous structures are nonsuspicious.  CT ABDOMEN and PELVIS FINDINGS  The liver is overall normal in size, 3 lobulated fluid density cysts within the liver, largest measuring 2 x 2.6 cm in the left lobe of the liver, the liver is otherwise unremarkable.  The spleen, pancreas, gallbladder and adrenal glands are unremarkable.  Stomach, small large bowel are normal in course and caliber, contrast is yet to reach the distal large bowel. Normal contrast filled appendix. Trace free fluid in the pelvic cul-de-sac may be physiologic. No intraperitoneal free air or drainable fluid collections.  Kidneys are unremarkable. The in opacified ureters are normal in course and caliber without urolithiasis. Urinary bladder is well distended and unremarkable. Enlarged heterogeneous uterus with at superimposed frontal 3.9 x 3.9 cm apparent intramural leiomyoma, please see pelvic sonogram March 07, 2011 for further description.  Review of the MIP images confirms the above findings.  IMPRESSION: No pulmonary embolism.  Right middle lobe consolidation concerning for pneumonia abuts the diaphragmatic surface. Recommend followup imaging to verify improvement after treatment.  No acute intra-abdominal or pelvic process.   Electronically Signed By: Awilda Metro On: 02/23/2013 04:40   MDM   CT of the chest demonstrates right middle lobe consolidation concerning for pneumonia which abuts the diaphragmatic surface. Infectious etiology also supported by finding of new and mild leukocytosis. We have ruled out pulmonary embolus. The patient is feeling much better after supportive tx. Her VS are  wnl and she is able to tolerate po intake. Low PORT score. We will tx with dose of Levaquin in anticipation of discharge home. Patient will schedule follow up with her PCP for Wed (2 days). She is advised re: return precautions.    Brandt Loosen, MD 02/23/13 [phone]

## 2013-02-23 NOTE — ED Notes (Signed)
Drinking contrast for CT.  Reports a  "twinge" of a headache and left side of face "feeling a little funny"

## 2013-04-02 ENCOUNTER — Other Ambulatory Visit: Payer: Self-pay | Admitting: Family Medicine

## 2013-04-02 DIAGNOSIS — R918 Other nonspecific abnormal finding of lung field: Secondary | ICD-10-CM

## 2013-04-03 ENCOUNTER — Ambulatory Visit
Admission: RE | Admit: 2013-04-03 | Discharge: 2013-04-03 | Disposition: A | Discharge status: Home / Self Care | Payer: No Typology Code available for payment source | Source: Ambulatory Visit | Attending: Family Medicine | Admitting: Family Medicine

## 2013-04-03 DIAGNOSIS — R918 Other nonspecific abnormal finding of lung field: Secondary | ICD-10-CM

## 2013-05-07 ENCOUNTER — Institutional Professional Consult (permissible substitution): Payer: No Typology Code available for payment source | Admitting: Internal Medicine

## 2013-05-13 ENCOUNTER — Ambulatory Visit (INDEPENDENT_AMBULATORY_CARE_PROVIDER_SITE_OTHER): Payer: No Typology Code available for payment source | Admitting: Internal Medicine

## 2013-05-13 ENCOUNTER — Encounter: Payer: Self-pay | Admitting: Internal Medicine

## 2013-05-13 VITALS — BP 114/82 | HR 70 | Ht 63.0 in | Wt 217.0 lb

## 2013-05-13 DIAGNOSIS — R059 Cough, unspecified: Secondary | ICD-10-CM | POA: Insufficient documentation

## 2013-05-13 DIAGNOSIS — R05 Cough: Secondary | ICD-10-CM | POA: Insufficient documentation

## 2013-05-13 MED ORDER — PANTOPRAZOLE SODIUM 40 MG PO TBEC: 40.0000 mg | DELAYED_RELEASE_TABLET | Freq: Every day | ORAL | Status: DC

## 2013-05-13 MED ORDER — FAMOTIDINE 20 MG PO TABS: ORAL_TABLET | ORAL | Status: DC

## 2013-05-13 MED ORDER — TRAMADOL HCL 50 MG PO TABS: ORAL_TABLET | ORAL | Status: DC

## 2013-05-13 MED ORDER — NEBIVOLOL HCL 10 MG PO TABS: 10.0000 mg | ORAL_TABLET | Freq: Every day | ORAL | Status: DC

## 2013-05-13 MED ORDER — PREDNISONE 10 MG PO TABS: ORAL_TABLET | ORAL | Status: DC

## 2013-05-13 NOTE — Patient Instructions (Signed)
Stop tenormin (atenolol) and start bystolic 10 mg daily for now  The key to effective treatment for your cough is eliminating the non-stop cycle of cough you're stuck in long enough to let your airway heal completely and then see if there is anything still making you cough once you stop the cough suppression, but this should take no more than 5 days to figure out  First take delsym two tsp every 12 hours and supplement if needed with  tramadol 50 mg up to 2 every 4 hours to suppress the urge to cough at all or even clear your throat. Swallowing water or using ice chips/non mint and menthol containing candies (such as lifesavers or sugarless jolly ranchers) are also effective.  You should rest your voice and avoid activities that you know make you cough.  Once you have eliminated the cough for 3 straight days try reducing the tramadol first,  then the delsym as tolerated.    Prednisone 10 mg take  4 each am x 2 days,   2 each am x 2 days,  1 each am x 2 days and stop     Pantoprazole (protonix) 40 mg   Take 30-60 min before first meal of the day and Pepcid 20 mg one bedtime until return to office - this is the best way to tell whether stomach acid is contributing to your problem.    GERD (REFLUX)  is an extremely common cause of respiratory symptoms, many times with no significant heartburn at all.    It can be treated with medication, but also with lifestyle changes including avoidance of late meals, excessive alcohol, smoking cessation, and avoid fatty foods, chocolate, peppermint, colas, red wine, and acidic juices such as orange juice.  NO MINT OR MENTHOL PRODUCTS SO NO COUGH DROPS  USE SUGARLESS CANDY INSTEAD (jolley ranchers or Stover's)  NO OIL BASED VITAMINS - use powdered substitutes.    Please schedule a follow up office visit in 2 weeks, sooner if needed

## 2013-05-13 NOTE — Progress Notes (Signed)
   Subjective:    Patient ID: Donna Norton, female    DOB: [birth year] MRN: [id number]  HPI  56 yobf never smoker with tendency to cough x 5 years  dx'd  As recurrent bronchitis never maintained on meds then dx pna in oct 2014 and "not right since"  referred 05/13/2013 by [name] to pulmonary clinic.  05/13/2013 1st [facility] office visit/ Donna Norton cc acute onset L cp / green clumps and dx'd with pna 02/23/13 RML then f/u  Ct 04/03/13 localized to RLL ant segment  rx with levaquin x 3 last time Apr 16 2013 and still not better with residual daytime cough "every time the furnace comes on" or when around perfume and sense of discomfort R chest only with deep breath and doe but not with adls  No obvious day to day or daytime variabilty or assoc   chest tightness, subjective wheeze overt sinus or hb symptoms. No unusual exp hx or h/o childhood pna/ asthma or knowledge of premature birth.  Sleeping ok without nocturnal  or early am exacerbation  of respiratory  c/o's or need for noct saba. Also denies any obvious fluctuation of symptoms with weather or environmental changes or other aggravating or alleviating factors except as outlined above   Current Medications, Allergies, Complete Past Medical History, Past Surgical History, Family History, and Social History were reviewed in [employer] record.            Review of Systems  Constitutional: Positive for fever. Negative for unexpected weight change.  HENT: Positive for postnasal drip and sinus pressure. Negative for congestion, dental problem, ear pain, nosebleeds, rhinorrhea, sneezing, sore throat and trouble swallowing.   Eyes: Negative for redness and itching.  Respiratory: Positive for cough, chest tightness, shortness of breath and wheezing.   Cardiovascular: Negative for palpitations and leg swelling.  Gastrointestinal: Negative for nausea and vomiting.  Genitourinary: Negative for dysuria.   Musculoskeletal: Negative for joint swelling.  Skin: Negative for rash.  Neurological: Negative for headaches.  Hematological: Does not bruise/bleed easily.  Psychiatric/Behavioral: Negative for dysphoric mood. The patient is not nervous/anxious.        Objective:   Physical Exam  Wt Readings from Last 3 Encounters:  05/13/13 217 lb (98.431 kg)       HEENT: nl dentition, turbinates, and orophanx. Nl external ear canals without cough reflex   NECK :  without JVD/Nodes/TM/ nl carotid upstrokes bilaterally   LUNGS: no acc muscle use, clear to A and P bilaterally without cough on insp or exp maneuvers   CV:  RRR  no s3 or murmur or increase in P2, no edema   ABD:  soft and nontender with nl excursion in the supine position. No bruits or organomegaly, bowel sounds nl  MS:  warm without deformities, calf tenderness, cyanosis or clubbing  SKIN: warm and dry without lesions    NEURO:  alert, approp, no deficits    04/03/14 CTa Mucoid impaction within anterior basal segment of the right lower  lobe is identified, image 39/series 3. This appears improved from  the previous examination. No airspace consolidation identified     Assessment & Plan:

## 2013-05-16 NOTE — Assessment & Plan Note (Signed)
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   These are the same pts (now being labeled as having "irritable larynx syndrome" by some cough centers) who not infrequently have a history of having failed to tolerate ace inhibitors,  dry powder inhalers or biphosphonates or report having atypical reflux symptoms that don't respond to standard doses of PPI , and are easily confused as having aecopd or asthma flares by even experienced allergists/ pulmonologists.  Clearly all her symptoms started with pna which has resolved with min scarring on CTa chest so will address cyclical cough first then regroup  See instructions for specific recommendations which were reviewed directly with the patient who was given a copy with highlighter outlining the key components.

## 2013-05-25 ENCOUNTER — Telehealth: Payer: Self-pay | Admitting: Internal Medicine

## 2013-05-25 NOTE — Telephone Encounter (Signed)
appt rescheduled for 05-29-13. Allen Bing, CMA

## 2013-05-25 NOTE — Telephone Encounter (Signed)
?   Why did she not reschedule? Needs appt  LMTCB

## 2013-05-27 ENCOUNTER — Ambulatory Visit: Payer: No Typology Code available for payment source | Admitting: Internal Medicine

## 2013-05-29 ENCOUNTER — Encounter: Payer: Self-pay | Admitting: Internal Medicine

## 2013-05-29 ENCOUNTER — Ambulatory Visit (INDEPENDENT_AMBULATORY_CARE_PROVIDER_SITE_OTHER): Payer: No Typology Code available for payment source | Admitting: Internal Medicine

## 2013-05-29 VITALS — BP 114/80 | HR 60 | Temp 97.6°F | Ht 62.0 in | Wt 212.8 lb

## 2013-05-29 DIAGNOSIS — R059 Cough, unspecified: Secondary | ICD-10-CM

## 2013-05-29 DIAGNOSIS — R05 Cough: Secondary | ICD-10-CM

## 2013-05-29 DIAGNOSIS — I10 Essential (primary) hypertension: Secondary | ICD-10-CM

## 2013-05-29 MED ORDER — NEBIVOLOL HCL 10 MG PO TABS: 10.0000 mg | ORAL_TABLET | Freq: Every day | ORAL | Status: DC

## 2013-05-29 MED ORDER — NEBIVOLOL HCL 5 MG PO TABS: 5.0000 mg | ORAL_TABLET | Freq: Every day | ORAL | Status: DC

## 2013-05-29 NOTE — Progress Notes (Signed)
Subjective:    Patient ID: Donna Norton, female    DOB: 06/19/1965 MRN: [id number]    Brief patient profile:  42 yobf never smoker with tendency to cough x 5 years  dx'd  As recurrent bronchitis never maintained on meds then dx pna in oct 2014 and "not right since"  referred 05/13/2013 by Donna Norton to pulmonary clinic.   History of Present Illness   05/13/2013 1st [facility] office visit/ Donna Norton cc acute onset L cp / green clumps and dx'd with pna 02/23/13 RML then f/u  Ct 04/03/13 localized to RLL ant segment  rx with levaquin x 3 last time Apr 16 2013 and still not better with residual daytime cough "every time the furnace comes on" or when around perfume and sense of discomfort R chest only with deep breath and doe but not with adls rec Stop tenormin (atenolol) and start bystolic 10 mg daily for now First take delsym two tsp every 12 hours and supplement if needed with  tramadol 50 mg up to 2 every 4 hours to suppress the urge to cough at all or even clear your throat.  Prednisone 10 mg take  4 each am x 2 days,   2 each am x 2 days,  1 each am x 2 days and stop   Pantoprazole (protonix) 40 mg   Take 30-60 min before first meal of the day and Pepcid 20 mg one bedtime until return to office - this is the best way to tell whether stomach acid is contributing to your problem.   GERD diet    05/29/2013 f/u ov/Donna Norton re: cough > sob  Not using gerd rx  Chief Complaint  Patient presents with  . Follow-up    Pt states no changes in her breathing. Her cough is much improved.   not limited from desired activities by sob.     No obvious day to day or daytime variabilty or assoc  cp or chest tightness, subjective wheeze overt sinus or hb symptoms. No unusual exp hx or h/o childhood pna/ asthma or knowledge of premature birth.  Sleeping ok without nocturnal  or early am exacerbation  of respiratory  c/o's or need for noct saba. Also denies any obvious fluctuation of symptoms with weather  or environmental changes or other aggravating or alleviating factors except as outlined above   Current Medications, Allergies, Complete Past Medical History, Past Surgical History, Family History, and Social History were reviewed in [employer] record.  ROS  The following are not active complaints unless bolded sore throat, dysphagia, dental problems, itching, sneezing,  nasal congestion or excess/ purulent secretions, ear ache,   fever, chills, sweats, unintended wt loss, pleuritic or exertional cp, hemoptysis,  orthopnea pnd or leg swelling, presyncope, palpitations, heartburn, abdominal pain, anorexia, nausea, vomiting, diarrhea  or change in bowel or urinary habits, change in stools or urine, dysuria,hematuria,  rash, arthralgias, visual complaints, headache, numbness weakness or ataxia or problems with walking or coordination,  change in mood/affect or memory.                      Objective:   Physical Exam  Wt Readings from Last 3 Encounters:  05/29/13 212 lb 12.8 oz (96.525 kg)  05/13/13 217 lb (98.431 kg)          HEENT: nl dentition, turbinates, and orophanx. Nl external ear canals without cough reflex   NECK :  without JVD/Nodes/TM/ nl carotid upstrokes  bilaterally   LUNGS: no acc muscle use, clear to A and P bilaterally without cough on insp or exp maneuvers   CV:  RRR  no s3 or murmur or increase in P2, no edema   ABD:  soft and nontender with nl excursion in the supine position. No bruits or organomegaly, bowel sounds nl  MS:  warm without deformities, calf tenderness, cyanosis or clubbing  SKIN: warm and dry without lesions    NEURO:  alert, approp, no deficits    04/03/14 CTa Mucoid impaction within anterior basal segment of the right lower  lobe is identified, image 39/series 3. This appears improved from  the previous examination. No airspace consolidation identified     Assessment & Plan:

## 2013-05-29 NOTE — Patient Instructions (Addendum)
Continue Pantoprazole (protonix) 40 mg   Take 30-60 min before first meal of the day and Pepcid 20 mg one bedtime until no coughing at all for several weeks without the need for cough suppression  If need more than delsym to control cough , then you need to return  Reduce bystolic to 5 mg daily and return to [employer] before it runs out.

## 2013-05-31 DIAGNOSIS — I10 Essential (primary) hypertension: Secondary | ICD-10-CM | POA: Insufficient documentation

## 2013-05-31 NOTE — Assessment & Plan Note (Addendum)
Much better with change to specific beta blockers and rx for gerd though not really clear which helped more.  Her CT showed marked improvement so no more w/u contemplated for now

## 2013-05-31 NOTE — Assessment & Plan Note (Signed)
05/13/13 Changed tenormin to bystolic due to ? Contributing to cough variant asthma > resolved  Adequate control on present rx, reviewed > could probably just control with bystolic 5 mg daily assuming insurance restrictions don't apply  F/u per primary care recommended

## 2013-09-15 ENCOUNTER — Other Ambulatory Visit (HOSPITAL_COMMUNITY): Payer: Self-pay | Admitting: Family Medicine

## 2013-10-05 ENCOUNTER — Other Ambulatory Visit (HOSPITAL_COMMUNITY): Payer: Self-pay | Admitting: Family Medicine

## 2013-10-05 DIAGNOSIS — Z1231 Encounter for screening mammogram for malignant neoplasm of breast: Secondary | ICD-10-CM

## 2013-10-21 ENCOUNTER — Ambulatory Visit (HOSPITAL_COMMUNITY)
Admission: RE | Admit: 2013-10-21 | Discharge: 2013-10-21 | Disposition: A | Discharge status: Home / Self Care | Payer: No Typology Code available for payment source | Source: Ambulatory Visit | Attending: Family Medicine | Admitting: Family Medicine

## 2013-10-21 DIAGNOSIS — Z1231 Encounter for screening mammogram for malignant neoplasm of breast: Secondary | ICD-10-CM

## 2013-12-10 ENCOUNTER — Emergency Department (HOSPITAL_COMMUNITY): Payer: No Typology Code available for payment source

## 2013-12-10 ENCOUNTER — Encounter (HOSPITAL_COMMUNITY): Payer: Self-pay | Admitting: Emergency Medicine

## 2013-12-10 ENCOUNTER — Emergency Department (HOSPITAL_COMMUNITY)
Admission: EM | Admit: 2013-12-10 | Discharge: 2013-12-10 | Disposition: A | Discharge status: Home / Self Care | Payer: Self-pay | Attending: Emergency Medicine | Admitting: Emergency Medicine

## 2013-12-10 DIAGNOSIS — IMO0001 Reserved for inherently not codable concepts without codable children: Secondary | ICD-10-CM | POA: Insufficient documentation

## 2013-12-10 DIAGNOSIS — M62838 Other muscle spasm: Secondary | ICD-10-CM

## 2013-12-10 DIAGNOSIS — Z8639 Personal history of other endocrine, nutritional and metabolic disease: Secondary | ICD-10-CM | POA: Insufficient documentation

## 2013-12-10 DIAGNOSIS — Z862 Personal history of diseases of the blood and blood-forming organs and certain disorders involving the immune mechanism: Secondary | ICD-10-CM | POA: Insufficient documentation

## 2013-12-10 DIAGNOSIS — M129 Arthropathy, unspecified: Secondary | ICD-10-CM | POA: Insufficient documentation

## 2013-12-10 DIAGNOSIS — R519 Headache, unspecified: Secondary | ICD-10-CM

## 2013-12-10 DIAGNOSIS — Z791 Long term (current) use of non-steroidal anti-inflammatories (NSAID): Secondary | ICD-10-CM | POA: Insufficient documentation

## 2013-12-10 DIAGNOSIS — R51 Headache: Secondary | ICD-10-CM | POA: Insufficient documentation

## 2013-12-10 DIAGNOSIS — G8929 Other chronic pain: Secondary | ICD-10-CM

## 2013-12-10 HISTORY — DX: Paresthesia of skin: R20.2

## 2013-12-10 HISTORY — DX: Headache, unspecified: R51.9

## 2013-12-10 HISTORY — DX: Weakness: R53.1

## 2013-12-10 HISTORY — DX: Chronic cough: R05.3

## 2013-12-10 HISTORY — DX: Other chronic pain: G89.29

## 2013-12-10 HISTORY — DX: Cough: R05

## 2013-12-10 HISTORY — DX: Headache: R51

## 2013-12-10 MED ORDER — DIPHENHYDRAMINE HCL 50 MG/ML IJ SOLN
25.0000 mg | Freq: Once | INTRAMUSCULAR | Status: AC
  Administered 2013-12-10: 25 mg via INTRAVENOUS

## 2013-12-10 MED ORDER — HYDROCODONE-ACETAMINOPHEN 5-325 MG PO TABS: ORAL_TABLET | ORAL | Status: DC

## 2013-12-10 MED ORDER — SODIUM CHLORIDE 0.9 % IV SOLN
INTRAVENOUS | Status: DC
  Administered 2013-12-10: 08:00:00 via INTRAVENOUS

## 2013-12-10 MED ORDER — KETOROLAC TROMETHAMINE 30 MG/ML IJ SOLN
30.0000 mg | Freq: Once | INTRAMUSCULAR | Status: AC
  Administered 2013-12-10: 30 mg via INTRAVENOUS
  Filled 2013-12-10: qty 1

## 2013-12-10 MED ORDER — PROMETHAZINE HCL 25 MG/ML IJ SOLN
12.5000 mg | Freq: Once | INTRAMUSCULAR | Status: AC
  Administered 2013-12-10: 12.5 mg via INTRAVENOUS
  Filled 2013-12-10: qty 1

## 2013-12-10 MED ORDER — METHOCARBAMOL 500 MG PO TABS: 1000.0000 mg | ORAL_TABLET | Freq: Four times a day (QID) | ORAL | Status: DC | PRN

## 2013-12-10 MED ORDER — DIPHENHYDRAMINE HCL 50 MG/ML IJ SOLN
50.0000 mg | Freq: Once | INTRAMUSCULAR | Status: DC
  Filled 2013-12-10: qty 1

## 2013-12-10 MED ORDER — MORPHINE SULFATE 4 MG/ML IJ SOLN
4.0000 mg | INTRAMUSCULAR | Status: DC | PRN
  Administered 2013-12-10: 4 mg via INTRAVENOUS
  Filled 2013-12-10: qty 1

## 2013-12-10 MED ORDER — NAPROXEN 250 MG PO TABS: 250.0000 mg | ORAL_TABLET | Freq: Two times a day (BID) | ORAL | Status: DC

## 2013-12-10 MED ORDER — SODIUM CHLORIDE 0.9 % IV BOLUS (SEPSIS)
1000.0000 mL | Freq: Once | INTRAVENOUS | Status: AC
  Administered 2013-12-10: 1000 mL via INTRAVENOUS

## 2013-12-10 NOTE — ED Notes (Signed)
Migraine: "pain all over head."  Pain down neck and shoulders. Non productive cough. Weakness.

## 2013-12-10 NOTE — ED Provider Notes (Signed)
CSN: [id number]     Arrival date & time 12/10/13  2993 History   First MD Initiated Contact with Patient 12/10/13 [phone]     Chief Complaint  Patient presents with  . Migraine  . Weakness  . Cough      HPI Pt was seen at 0705. Per pt, c/o gradual onset and persistence of constant acute flair of her chronic headache since yesterday. States she woke up with the headache. Describes the headache as "aching all over my head," and per her usual chronic headache pain pattern for many years. Has been associated with generalized weakness/fatigue as well as discomfort in her lower neck/upper back area. Also c/o acute flair of her chronic cough for the past several days.  Denies headache was sudden or maximal in onset or at any time.  Denies visual changes, no focal motor weakness, no tingling/numbness in extremities, no fevers, no rash, no SOB/CP, no abd pain, no N/V/D. The symptoms have been associated with no other complaints. The patient has a significant history of similar symptoms previously, recently being evaluated for this complaint and multiple prior evals for same.     Past Medical History  Diagnosis Date  . Fibromyalgia   . Meningitis   . Thyroid disease   . Arthritis   . Chronic cough   . Chronic headache   . Generalized weakness   . Paresthesias     chronic   History reviewed. No pertinent past surgical history.  Family History  Problem Relation Age of Onset  . Allergies Child   . Asthma Grandchild   . Heart disease Father     5 stents  . Rheum arthritis Mother   . Cancer - Cervical Sister    History  Substance Use Topics  . Smoking status: Never Smoker   . Smokeless tobacco: Never Used  . Alcohol Use: Yes     Comment: occasional sangria    Review of Systems ROS: Statement: All systems negative except as marked or noted in the HPI; Constitutional: Negative for fever and chills. ; ; Eyes: Negative for eye pain, redness and discharge. ; ; ENMT: Negative for ear pain,  hoarseness, nasal congestion, sinus pressure and sore throat. ; ; Cardiovascular: Negative for chest pain, palpitations, diaphoresis, dyspnea and peripheral edema. ; ; Respiratory: +cough. Negative for wheezing and stridor. ; ; Gastrointestinal: Negative for nausea, vomiting, diarrhea, abdominal pain, blood in stool, hematemesis, jaundice and rectal bleeding. . ; ; Genitourinary: Negative for dysuria, flank pain and hematuria. ; ; Musculoskeletal: +lower neck/upper back pain. Negative for swelling and trauma.; ; Skin: Negative for pruritus, rash, abrasions, blisters, bruising and skin lesion.; ; Neuro: +headache. Negative for lightheadedness and neck stiffness. Negative for weakness, altered level of consciousness , altered mental status, extremity weakness, paresthesias, involuntary movement, seizure and syncope.      Allergies  Methimazole and Percocet  Home Medications   Prior to Admission medications   Medication Sig Start Date End Date Taking? Authorizing Provider  celecoxib (CELEBREX) 200 MG capsule Take 200 mg by mouth daily.     Historical Provider, MD  famotidine (PEPCID) 20 MG tablet One at bedtime 05/13/13   Tanda Rockers, MD  ibuprofen (ADVIL,MOTRIN) 200 MG tablet Take 400 mg by mouth every 6 (six) hours as needed for pain.    Historical Provider, MD  Multiple Vitamins-Minerals (MULTIVITAMIN & MINERAL PO) Take 0.5 tablets by mouth every morning.     Historical Provider, MD  nebivolol (BYSTOLIC) 10 MG tablet Take  1 tablet (10 mg total) by mouth daily. 05/29/13   Tanda Rockers, MD  nebivolol (BYSTOLIC) 5 MG tablet Take 1 tablet (5 mg total) by mouth daily. 05/29/13   Tanda Rockers, MD  pantoprazole (PROTONIX) 40 MG tablet Take 1 tablet (40 mg total) by mouth daily. Take 30-60 min before first meal of the day 05/13/13   Tanda Rockers, MD  traMADol (ULTRAM) 50 MG tablet 1-2 every 4 hours as needed for cough or pain 05/13/13   Tanda Rockers, MD   BP 127/86  Pulse 95  Temp(Src) 98.1 F  (36.7 C) (Oral)  Ht 5\' 3"  (1.6 m)  Wt 200 lb (90.719 kg)  BMI 35.44 kg/m2  SpO2 99% Physical Exam 0710: Physical examination:  Nursing notes reviewed; Vital signs and O2 SAT reviewed;  Constitutional: Well developed, Well nourished, Well hydrated, In no acute distress; Head:  Normocephalic, atraumatic; Eyes: EOMI, PERRL, No scleral icterus; ENMT: Mouth and pharynx normal, Mucous membranes moist; Neck: Supple, Full range of motion, No lymphadenopathy. No meningeal signs.; Cardiovascular: Regular rate and rhythm, No murmur, rub, or gallop; Respiratory: Breath sounds clear & equal bilaterally, No rales, rhonchi, wheezes.  Speaking full sentences with ease, Normal respiratory effort/excursion; Chest: Nontender, Movement normal; Abdomen: Soft, Nontender, Nondistended, Normal bowel sounds; Genitourinary: No CVA tenderness; Spine:  No midline CS, TS, LS tenderness. +TTP R>L hypertonic trapezius and upper thoracic paraspinal muscles. No rash.;; Extremities: Pulses normal, No tenderness, No edema, No calf edema or asymmetry.; Neuro: AA&Ox3, Major CN grossly intact. No facial droop. Speech clear. No gross focal motor or sensory deficits in extremities. Climbs on and off stretcher easily by herself. Gait steady.; Skin: Color normal, Warm, Dry.    ED Course  Procedures     EKG Interpretation None      MDM  MDM Reviewed: previous chart, nursing note and vitals Reviewed previous: CT scan and x-ray Interpretation: x-ray     1200:  Pt has been sleeping after meds. Easily awakened. Feels improved after meds and wants to go home now. No red flags on H&P. Will continue to tx symptomatically at this time. Dx d/w pt and family.  Questions answered.  Verb understanding, agreeable to d/c home with outpt f/u.   Francine Graven, DO 12/14/13 1527

## 2013-12-10 NOTE — Discharge Instructions (Signed)
°Emergency Department Resource Guide °1) Find a Doctor and Pay Out of Pocket °Although you won't have to find out who is covered by your insurance plan, it is a good idea to ask around and get recommendations. You will then need to call the office and see if the doctor you have chosen will accept you as a new patient and what types of options they offer for patients who are self-pay. Some doctors offer discounts or will set up payment plans for their patients who do not have insurance, but you will need to ask so you aren't surprised when you get to your appointment. ° °2) Contact Your Local Health Department °Not all health departments have doctors that can see patients for sick visits, but many do, so it is worth a call to see if yours does. If you don't know where your local health department is, you can check in your phone book. The CDC also has a tool to help you locate your state's health department, and many state websites also have listings of all of their local health departments. ° °3) Find a Walk-in Clinic °If your illness is not likely to be very severe or complicated, you may want to try a walk in clinic. These are popping up all over the country in pharmacies, drugstores, and shopping centers. They're usually staffed by nurse practitioners or physician assistants that have been trained to treat common illnesses and complaints. They're usually fairly quick and inexpensive. However, if you have serious medical issues or chronic medical problems, these are probably not your best option. ° °No Primary Care Doctor: °- Call [facility] at  832-8000 - they can help you locate a primary care doctor that  accepts your insurance, provides certain services, etc. °- Physician Referral Service- 1-800-533-3463 ° °Chronic Pain Problems: °Organization         Address  Phone   Notes  °[facility]  (336) 297-2271 Patients need to be referred by their primary care doctor.  ° °Medication  Assistance: °Organization         Address  Phone   Notes  °[place] [facility] [address] °[place], [place] [address] (336) 641-8030 --Must be a resident of [place] °-- Must have NO insurance coverage whatsoever (no Medicaid/ Medicare, etc.) °-- The pt. MUST have a primary care doctor that directs their care regularly and follows them in the community °  °[facility]  (866) 331-1348   °[employer]  (888) 892-1162   ° °Agencies that provide inexpensive medical care: °Organization         Address  Phone   Notes  °[facility]  (336) 832-8035   °[facility]    (336) 832-7272   °[facility] [address] °[place], [place] [address] (336) 832-4777   °[facility] [address], °[place] (336) 271-4999   °[facility]    (336) 373-0678   °[facility]    (336) 272-1050   °[facility] ° [address], [place] Phone:  (336) 832-4444, Fax:  (336) 832-4440 Hours of Operation:  9 am - 6 pm, M-F.  Also accepts Medicaid/Medicare and self-pay.  °[facility] ° [address],  Phone: (336) 832-3150, Fax: (336) [phone]. Hours of Operation:  8:30 am - 5:30 pm, M-F.  Also accepts Medicaid and self-pay.  °[facility] [place] [address], [place] Phone: (336) 878-6027   °[facility] [address], [place], [place] (336)723-1848, Ext. 123 Mondays & Thursdays: 7-9 AM.  First 15 patients are seen on a first come, first serve basis. °  ° °Medicaid-accepting [place] Providers: ° °Organization         Address  Phone   Notes  °[facility] [address], [place] (336) 641-2100 Also accepts self-pay patients.  °[facility] [address], [place] ° (336) 856-9996   °[facility] [address] [place] [address], [place]  (336) 288-8857   °[facility] [address] [place] [address], [place] (336) 299-7000   °Veita Bland [address], [place]  ° (336) 373-1557 Only accepts [place] Access Medicaid patients after they have their name applied to their card.  ° °Self-Pay (no insurance) in [place]: ° °Organization         Address  Phone   Notes  °Sickle Cell Patients, [facility] [address], [place] (336) 832-1970   °[facility] [address], [place] (336) 832-4400   °[facility] [place] ° [address], [place] (336) 992-4800   °[facility]/Dr. Osei-Bonsu ° [address] [place] [address], [place] or [address], [place] (336) [phone] Phone number for both [place] and [place] locations is the same.  °[facility] [address], [place] (336) 299-0000   °[facility] [address] [place] [address], [place] or [address] (336) 852-7530 °(336) 878-2260   °[facility] [address], [place] (336) 350-1642, phone; (336) 294-5005, fax Sees patients 1st and 3rd Saturday of every month.  Must not qualify for public or private insurance (i.e. Medicaid, Medicare, [place] Health Choice, Veterans' Benefits) • Household income should be no more than 200% of the poverty level •The clinic cannot treat you if you are pregnant or think you are pregnant • Sexually transmitted diseases are not treated at the clinic.  ° ° °Dental Care: °Organization         Address  Phone  Notes  °[place] [facility] [address], [place] (336) 641-6152 Accepts children up to age 21 who are enrolled in Medicaid or [place] Health Choice; pregnant women with a Medicaid card; and children who have applied for Medicaid or [place] Health Choice, but were declined, whose parents can pay a reduced fee at time of service.  °[place]  [facility] [place]  [address], [place] (336) 641-7733 Accepts children up to age 21 who are enrolled in Medicaid or [place] Health Choice; pregnant women with a Medicaid card; and children who have applied for Medicaid or [place] Health Choice, but were declined, whose parents can pay a reduced fee at time of service.  °[facility] ° [address],  (336) 641-4533 Patients are seen by appointment only. Walk-ins are not accepted. [facility] will see patients 18 years of age and older. °Monday - Tuesday (8am-5pm) °Most Wednesdays (8:30-5pm) °$30 per visit, cash only  °[facility] ° [address], [place] (336) 641-4533 Patients are seen by appointment only. Walk-ins are not accepted. [facility] will see patients 18 years of age and older. °One   Wednesday Evening (Monthly: Volunteer Based).  $30 per visit, cash only  °[facility]  (919) 537-3737 for adults; Children under age 4, call Graduate Pediatric Dentistry at ([phone]) [phone]. Children aged 4-14, please call (919) [phone] to request a pediatric application. ° Dental services are provided in all areas of dental care including fillings, crowns and bridges, complete and partial dentures, implants, gum treatment, root canals, and extractions. Preventive care is also provided. Treatment is provided to both adults and children. °Patients are selected via a lottery and there is often a waiting list. °  °[facility] [address], °[place] ° (336) 763-8833 [email or web address] °  °[facility] [address], [place], [place] (336)723-1848, Ext. 123 Second and Fourth Thursday of each month, opens at 6:30 AM; Clinic ends at 9 AM.  Patients are seen on a first-come first-served basis, and a limited number are seen during each clinic.  ° °[facility] ° [address], [place], [place] (336) 723-7904    Eligibility Requirements °You must have lived in [place], or [place] counties for at least the last three months. °  You cannot be eligible for state or federal sponsored healthcare insurance, including Veterans Administration, Medicaid, or Medicare. °  You generally cannot be eligible for healthcare insurance through your employer.  °  How to apply: °Eligibility screenings are held every Tuesday and Wednesday afternoon from 1:00 pm until 4:00 pm. You do not need an appointment for the interview!  °[facility] [address], [place], [place] 336-631-2330   °[facility] [facility]  336-342-8273   °[facility]  336-703-3100   °[facility]  336-570-6415   ° °Behavioral Health Resources in the Community: °Intensive Outpatient Programs °Organization         Address  Phone  Notes  °[facility] [address], [place], [place] 336-878-6098   °[facility] [address], [place], [place] 336-832-9800   °[facility] [address], [place], [place] ° 336-882-2125   °[facility] [address],  °[place], [place] 1-800-853-5163 or 336-641-4981   °Substance Abuse Resources °Organization         Address  Phone  Notes  °[facility]  336-882-2125   °[facility]  336-784-9470   °[facility]  336-285-9073   °[facility]  336-845-3988   °Residential & Outpatient Substance Abuse Program  1-800-659-3381   °Psychological Services °Organization         Address  Phone  Notes  ° Health  336- 832-9600   °[facility]  336- 378-7881   °[facility] [address], [place] 1-800-853-5163 or 336-641-4981   ° °Mobile Crisis Teams °Organization         Address  Phone  Notes  °[facility], Mobile Crisis Care Unit  1-877-626-1772   °Assertive °[facility] ° [address]  [place], [place] 336-834-9664   °Sharon DeEsch [address] °[place] 336-554-5454   ° °Self-Help/Support Groups °Organization         Address  Phone             Notes  °[facility] [place] - variety of support groups  336- 373-1402 Call for more information  °Narcotics Anonymous (NA), [facility] [address], °[place]  2 meetings at this location  ° °  Residential Treatment Programs °Organization         Address  Phone  Notes  °[facility] [address],    °[place]  1-866-801-8205   °[facility] ° [address], [place], [place] 704-293-8524   °[facility] [address], [place] 336-845-3988 Admissions: 8am-3pm M-F  °[facility] [address]    °[place], [place] 336-841-1104   °[facility] [address], [place], [place] 336-379-7146   °[facility] [address]  °[place], [place] 336-285-9073   °[facility] - Intensive Outpatient [address], [place], [place] 336-852-3033   °[facility] [address]  °[place], [place] 1-877-615-2722 or 336-784-9470   °[facility] [address] [place], [place] 336-227-7417 Accepts Medicaid  °[facility] [address]  °[place] 1-800-659-3381 Substance Abuse/Addiction Treatment  ° °[place] Behavioral Health Resources °Organization         Address  Phone  Notes  °[facility]  (888) 581-9988   °Julie Brannon, PhD [address] [place], [place]   (336) 349-5553 or (336) 951-0000   °[facility]   [address] °[place], [place] (336) 349-4454   °[facility] [address], [place], [place] (336) 342-8316 Insurance/Medicaid/sponsorship through [facility]  °[facility] [address]                                    [place], [place] (336) 342-8316 Therapy/tele-psych/case    °[facility] [address]  ° [place], [place] (336) 349-2233    °Dr. Arfeen  (336) 349-4544   °[facility] [place]  [employer] [place] [facility] 1) [address], [place] °2) [address], [place] °3)  [address], [place] (336) 349-3220 °(336) 342-7768 ° °(336) 342-8140   °[place] [facility] (336) 342-1394 or (336) 342-3537 (After Hours)    ° ° °Take the prescriptions as directed.  Apply moist heat or ice to the area(s) of discomfort, for 15 minutes at a time, several times per day for the next few days.  Do not fall asleep on a heating or ice pack.  Call your regular medical doctor today to schedule a follow up appointment in the next 2 days.  Return to the Emergency Department immediately if worsening. ° °

## 2013-12-11 NOTE — Discharge Planning (Signed)
[place] to patient regarding primary care resources and establishing care with a provider. Patient states she is seen by [facility], and has [facility]entry to her knowledge. I informed pt to contact her insurance carrier to get the status of her current coverage. Resource guide and my contact information provided for any future questions or concerns.

## 2013-12-31 ENCOUNTER — Emergency Department (HOSPITAL_COMMUNITY): Payer: No Typology Code available for payment source

## 2013-12-31 ENCOUNTER — Emergency Department (HOSPITAL_COMMUNITY)
Admission: EM | Admit: 2013-12-31 | Discharge: 2013-12-31 | Disposition: A | Discharge status: Home / Self Care | Payer: No Typology Code available for payment source | Attending: Emergency Medicine | Admitting: Emergency Medicine

## 2013-12-31 ENCOUNTER — Encounter (HOSPITAL_COMMUNITY): Payer: Self-pay | Admitting: Emergency Medicine

## 2013-12-31 DIAGNOSIS — M129 Arthropathy, unspecified: Secondary | ICD-10-CM | POA: Insufficient documentation

## 2013-12-31 DIAGNOSIS — R51 Headache: Secondary | ICD-10-CM | POA: Insufficient documentation

## 2013-12-31 DIAGNOSIS — Z791 Long term (current) use of non-steroidal anti-inflammatories (NSAID): Secondary | ICD-10-CM | POA: Insufficient documentation

## 2013-12-31 DIAGNOSIS — Z8639 Personal history of other endocrine, nutritional and metabolic disease: Secondary | ICD-10-CM | POA: Insufficient documentation

## 2013-12-31 DIAGNOSIS — R519 Headache, unspecified: Secondary | ICD-10-CM

## 2013-12-31 DIAGNOSIS — Z8701 Personal history of pneumonia (recurrent): Secondary | ICD-10-CM | POA: Insufficient documentation

## 2013-12-31 DIAGNOSIS — G8929 Other chronic pain: Secondary | ICD-10-CM | POA: Insufficient documentation

## 2013-12-31 DIAGNOSIS — Z79899 Other long term (current) drug therapy: Secondary | ICD-10-CM | POA: Insufficient documentation

## 2013-12-31 DIAGNOSIS — Z8669 Personal history of other diseases of the nervous system and sense organs: Secondary | ICD-10-CM | POA: Insufficient documentation

## 2013-12-31 DIAGNOSIS — Z862 Personal history of diseases of the blood and blood-forming organs and certain disorders involving the immune mechanism: Secondary | ICD-10-CM | POA: Insufficient documentation

## 2013-12-31 HISTORY — DX: Pneumonia, unspecified organism: J18.9

## 2013-12-31 LAB — CBC WITH DIFFERENTIAL/PLATELET
Basophils Absolute: 0 10*3/uL (ref 0.0–0.1)
Basophils Relative: 0 % (ref 0–1)
Eosinophils Absolute: 0.1 10*3/uL (ref 0.0–0.7)
Eosinophils Relative: 1 % (ref 0–5)
HCT: 44.1 % (ref 36.0–46.0)
Hemoglobin: 15.7 g/dL — ABNORMAL HIGH (ref 12.0–15.0)
Lymphocytes Relative: 30 % (ref 12–46)
Lymphs Abs: 3.4 10*3/uL (ref 0.7–4.0)
MCH: 31.9 pg (ref 26.0–34.0)
MCHC: 35.6 g/dL (ref 30.0–36.0)
MCV: 89.6 fL (ref 78.0–100.0)
Monocytes Absolute: 0.6 10*3/uL (ref 0.1–1.0)
Monocytes Relative: 6 % (ref 3–12)
Neutro Abs: 7.3 10*3/uL (ref 1.7–7.7)
Neutrophils Relative %: 63 % (ref 43–77)
Platelets: 252 10*3/uL (ref 150–400)
RBC: 4.92 MIL/uL (ref 3.87–5.11)
RDW: 12.6 % (ref 11.5–15.5)
WBC: 11.4 10*3/uL — ABNORMAL HIGH (ref 4.0–10.5)

## 2013-12-31 LAB — BASIC METABOLIC PANEL
Anion gap: 15 (ref 5–15)
BUN: 10 mg/dL (ref 6–23)
CO2: 24 mEq/L (ref 19–32)
Calcium: 9.2 mg/dL (ref 8.4–10.5)
Chloride: 99 mEq/L (ref 96–112)
Creatinine, Ser: 0.66 mg/dL (ref 0.50–1.10)
GFR calc Af Amer: >90 mL/min (ref >90)
GFR calc non Af Amer: >90 mL/min (ref >90)
Glucose, Bld: 94 mg/dL (ref 70–99)
Potassium: 4.8 mEq/L (ref 3.7–5.3)
Sodium: 138 mEq/L (ref 137–147)

## 2013-12-31 LAB — URINALYSIS, ROUTINE W REFLEX MICROSCOPIC
Bilirubin Urine: NEGATIVE
Glucose, UA: NEGATIVE mg/dL
Ketones, ur: NEGATIVE mg/dL
Nitrite: NEGATIVE
Protein, ur: NEGATIVE mg/dL
Specific Gravity, Urine: 1.025 (ref 1.005–1.030)
Urobilinogen, UA: 0.2 mg/dL (ref 0.0–1.0)
pH: 7 (ref 5.0–8.0)

## 2013-12-31 LAB — URINE MICROSCOPIC-ADD ON

## 2013-12-31 MED ORDER — PROCHLORPERAZINE EDISYLATE 5 MG/ML IJ SOLN
10.0000 mg | Freq: Four times a day (QID) | INTRAMUSCULAR | Status: DC | PRN
  Administered 2013-12-31: 10 mg via INTRAVENOUS
  Filled 2013-12-31: qty 2

## 2013-12-31 MED ORDER — SODIUM CHLORIDE 0.9 % IV BOLUS (SEPSIS)
1000.0000 mL | Freq: Once | INTRAVENOUS | Status: AC
  Administered 2013-12-31: 1000 mL via INTRAVENOUS

## 2013-12-31 MED ORDER — DIPHENHYDRAMINE HCL 50 MG/ML IJ SOLN
12.5000 mg | Freq: Once | INTRAMUSCULAR | Status: AC
  Administered 2013-12-31: 12.5 mg via INTRAVENOUS
  Filled 2013-12-31: qty 1

## 2013-12-31 MED ORDER — KETOROLAC TROMETHAMINE 15 MG/ML IJ SOLN
15.0000 mg | Freq: Once | INTRAMUSCULAR | Status: AC
  Administered 2013-12-31: 15 mg via INTRAVENOUS
  Filled 2013-12-31: qty 1

## 2013-12-31 NOTE — ED Notes (Signed)
Pt presents c/o migraine headache, felt generally weak all over this morning and tense from her head down her back. She states this same thing happened about a month ago and was seen here. States she was having chills last night and both of her arms feel weak. She went to check her blood pressure this morning and it was 76/64 became dizzy and fell hitting her head on the left side and her left leg on the hardwood floors.

## 2013-12-31 NOTE — ED Notes (Addendum)
Pt also reports a dull ache inside her left ear for the past month. Also reports she has not seen her PCP d/t a busy schedule.

## 2013-12-31 NOTE — ED Notes (Signed)
Dr Kohut at the bedside.  

## 2013-12-31 NOTE — ED Notes (Signed)
amb to bathroom for urine specimen

## 2014-01-01 LAB — URINE CULTURE
Colony Count: 45000
Special Requests: NORMAL

## 2014-01-07 NOTE — ED Provider Notes (Signed)
CSN: [id number]     Arrival date & time 12/31/13  0718 History   First MD Initiated Contact with Patient 12/31/13 0720     Chief Complaint  Patient presents with  . Migraine  . Fall     (Consider location/radiation/quality/duration/timing/severity/associated sxs/prior Treatment) HPI  68yf with HA. Has hx of what she calls migraine. HA feels similar to prior. Woke up with it this morning. Feels generally weak/fatigued. No fever. Felt cold last night. Nausea. No vomiting. Feeel this morning and did strike her head, but HA preceded this. No acute numbness, tingling or loss of strength.   Past Medical History  Diagnosis Date  . Fibromyalgia   . Meningitis   . Thyroid disease   . Arthritis   . Chronic cough   . Chronic headache   . Generalized weakness   . Paresthesias     chronic  . PNA (pneumonia)     "a year ago"   History reviewed. No pertinent past surgical history. Family History  Problem Relation Age of Onset  . Allergies Child   . Asthma Grandchild   . Heart disease Father     5 stents  . Rheum arthritis Mother   . Cancer - Cervical Sister    History  Substance Use Topics  . Smoking status: Never Smoker   . Smokeless tobacco: Never Used  . Alcohol Use: Yes     Comment: occasional sangria   OB History   Grav Para Term Preterm Abortions TAB SAB Ect Mult Living                 Review of Systems  All systems reviewed and negative, other than as noted in HPI.   Allergies  Methimazole and Percocet  Home Medications   Prior to Admission medications   Medication Sig Start Date End Date Taking? Authorizing Provider  acetaminophen (TYLENOL) 500 MG tablet Take 1,000 mg by mouth every 8 (eight) hours as needed for moderate pain.   Yes Historical Provider, MD  HYDROcodone-acetaminophen (NORCO/VICODIN) 5-325 MG per tablet Take 1-2 tablets by mouth every 6 (six) hours as needed for moderate pain.   Yes Historical Provider, MD  ibuprofen (ADVIL,MOTRIN) 200 MG  tablet Take 400 mg by mouth every 6 (six) hours as needed for pain.   Yes Historical Provider, MD  Multiple Vitamin (MULTIVITAMIN WITH MINERALS) TABS tablet Take 1 tablet by mouth daily.   Yes Historical Provider, MD  naproxen (NAPROSYN) 250 MG tablet Take 1 tablet (250 mg total) by mouth 2 (two) times daily with a meal. 12/10/13  Yes Francine Graven, DO   BP 108/70  Pulse 71  Temp(Src) 98.5 F (36.9 C) (Oral)  Resp 18  Ht 5\' 3"  (1.6 m)  Wt 200 lb (90.719 kg)  BMI 35.44 kg/m2  SpO2 95% Physical Exam  Nursing note and vitals reviewed. Constitutional: She is oriented to person, place, and time. She appears well-developed and well-nourished. No distress.  HENT:  Head: Normocephalic and atraumatic.  Eyes: Conjunctivae are normal. Right eye exhibits no discharge. Left eye exhibits no discharge.  Neck: Neck supple.  No nuchal rigidity  Cardiovascular: Normal rate, regular rhythm and normal heart sounds.  Exam reveals no gallop and no friction rub.   No murmur heard. Pulmonary/Chest: Effort normal and breath sounds normal. No respiratory distress.  Abdominal: Soft. She exhibits no distension. There is no tenderness.  Musculoskeletal: She exhibits no edema and no tenderness.  Neurological: She is alert and oriented to person, place, and  time. No cranial nerve deficit. She exhibits normal muscle tone. Coordination normal.  Speech clear. Content appropriate. Good finger to nose b/l.   Skin: Skin is warm and dry.  Psychiatric: She has a normal mood and affect. Her behavior is normal. Thought content normal.    ED Course  Procedures (including critical care time) Labs Review Labs Reviewed  CBC WITH DIFFERENTIAL - Abnormal; Notable for the following:    WBC 11.4 (*)    Hemoglobin 15.7 (*)    All other components within normal limits  URINALYSIS, ROUTINE W REFLEX MICROSCOPIC - Abnormal; Notable for the following:    Hgb urine dipstick MODERATE (*)    Leukocytes, UA TRACE (*)    All other  components within normal limits  URINE MICROSCOPIC-ADD ON - Abnormal; Notable for the following:    Squamous Epithelial / LPF MANY (*)    Bacteria, UA FEW (*)    All other components within normal limits  URINE CULTURE  BASIC METABOLIC PANEL    Imaging Review No results found.   EKG Interpretation   Date/Time:  Thursday December 31 2013 07:28:30 EDT Ventricular Rate:  78 PR Interval:  155 QRS Duration: 81 QT Interval:  390 QTC Calculation: 444 R Axis:   58 Text Interpretation:  Sinus rhythm Borderline T abnormalities, anterior  leads ED PHYSICIAN INTERPRETATION AVAILABLE IN [facility] Confirmed  by TEST, Record ([id number]) on 01/02/2014 4:47:04 PM      MDM   Final diagnoses:  Nonintractable headache, unspecified chronicity pattern, unspecified headache type    48yF with headache. Suspect primary HA. Consider emergent secondary causes such as bleed, infectious or mass but doubt. There is no history of trauma. Pt has a nonfocal neurological exam. Afebrile and neck supple. No use of blood thinning medication. Consider ocular etiology such as acute angle closure glaucoma but doubt. Pt denies acute change in visual acuity and eye exam unremarkable. Doubt temporal arteritis given age, no temporal tenderness and temporal artery pulsations palpable. Doubt CO poisoning. No contacts with similar symptoms. Doubt venous thrombosis. Doubt carotid or vertebral arteries dissection. Symptoms improved with meds. Not sure of significance of low BP pt reports this morning. Persistently normotensive in ED. No fever. No tachycardia. W/u fairly unremarkable.  Feel that can be safely discharged, but strict return precautions discussed. Outpt fu.     Virgel Manifold, MD 01/07/14 [phone]

## 2014-09-07 ENCOUNTER — Other Ambulatory Visit (HOSPITAL_COMMUNITY)
Admission: RE | Admit: 2014-09-07 | Discharge: 2014-09-07 | Disposition: A | Discharge status: Home / Self Care | Payer: 59 | Source: Ambulatory Visit | Attending: Family Medicine | Admitting: Family Medicine

## 2014-09-07 ENCOUNTER — Other Ambulatory Visit: Payer: Self-pay | Admitting: Physician Assistant

## 2014-09-07 DIAGNOSIS — Z124 Encounter for screening for malignant neoplasm of cervix: Secondary | ICD-10-CM | POA: Insufficient documentation

## 2014-09-09 LAB — CYTOLOGY - PAP

## 2015-01-15 ENCOUNTER — Emergency Department (HOSPITAL_COMMUNITY): Payer: 59

## 2015-01-15 ENCOUNTER — Encounter (HOSPITAL_COMMUNITY): Payer: Self-pay

## 2015-01-15 ENCOUNTER — Emergency Department (HOSPITAL_COMMUNITY)
Admission: EM | Admit: 2015-01-15 | Discharge: 2015-01-16 | Disposition: A | Discharge status: Home / Self Care | Payer: 59 | Attending: Emergency Medicine | Admitting: Emergency Medicine

## 2015-01-15 DIAGNOSIS — Z79899 Other long term (current) drug therapy: Secondary | ICD-10-CM | POA: Diagnosis not present

## 2015-01-15 DIAGNOSIS — Z8669 Personal history of other diseases of the nervous system and sense organs: Secondary | ICD-10-CM | POA: Insufficient documentation

## 2015-01-15 DIAGNOSIS — R05 Cough: Secondary | ICD-10-CM | POA: Insufficient documentation

## 2015-01-15 DIAGNOSIS — Z8639 Personal history of other endocrine, nutritional and metabolic disease: Secondary | ICD-10-CM | POA: Diagnosis not present

## 2015-01-15 DIAGNOSIS — R6510 Systemic inflammatory response syndrome (SIRS) of non-infectious origin without acute organ dysfunction: Secondary | ICD-10-CM

## 2015-01-15 DIAGNOSIS — R112 Nausea with vomiting, unspecified: Secondary | ICD-10-CM | POA: Diagnosis not present

## 2015-01-15 DIAGNOSIS — Z8701 Personal history of pneumonia (recurrent): Secondary | ICD-10-CM | POA: Diagnosis not present

## 2015-01-15 DIAGNOSIS — M1990 Unspecified osteoarthritis, unspecified site: Secondary | ICD-10-CM | POA: Diagnosis not present

## 2015-01-15 DIAGNOSIS — J189 Pneumonia, unspecified organism: Secondary | ICD-10-CM

## 2015-01-15 LAB — CBC
HCT: 47 % — ABNORMAL HIGH (ref 36.0–46.0)
Hemoglobin: 16.6 g/dL — ABNORMAL HIGH (ref 12.0–15.0)
MCH: 31.9 pg (ref 26.0–34.0)
MCHC: 35.3 g/dL (ref 30.0–36.0)
MCV: 90.4 fL (ref 78.0–100.0)
Platelets: 263 10*3/uL (ref 150–400)
RBC: 5.2 MIL/uL — ABNORMAL HIGH (ref 3.87–5.11)
RDW: 12.5 % (ref 11.5–15.5)
WBC: 15 10*3/uL — ABNORMAL HIGH (ref 4.0–10.5)

## 2015-01-15 LAB — I-STAT CG4 LACTIC ACID, ED
Lactic Acid, Venous: 1.95 mmol/L (ref 0.5–2.0)
Lactic Acid, Venous: 2.78 mmol/L (ref 0.5–2.0)
Lactic Acid, Venous: 1.76 mmol/L (ref 0.5–2.0)

## 2015-01-15 LAB — I-STAT TROPONIN, ED: Troponin i, poc: 0 ng/mL (ref 0.00–0.08)

## 2015-01-15 LAB — BASIC METABOLIC PANEL
Anion gap: 9 (ref 5–15)
BUN: 14 mg/dL (ref 6–20)
CO2: 26 mmol/L (ref 22–32)
Calcium: 9.5 mg/dL (ref 8.9–10.3)
Chloride: 104 mmol/L (ref 101–111)
Creatinine, Ser: 0.69 mg/dL (ref 0.44–1.00)
GFR calc Af Amer: >60 mL/min (ref >60)
GFR calc non Af Amer: >60 mL/min (ref >60)
Glucose, Bld: 122 mg/dL — ABNORMAL HIGH (ref 65–99)
Potassium: 4.2 mmol/L (ref 3.5–5.1)
Sodium: 139 mmol/L (ref 135–145)

## 2015-01-15 MED ORDER — METOCLOPRAMIDE HCL 5 MG/ML IJ SOLN
10.0000 mg | Freq: Once | INTRAMUSCULAR | Status: AC
  Administered 2015-01-15: 10 mg via INTRAVENOUS
  Filled 2015-01-15: qty 2

## 2015-01-15 MED ORDER — ACETAMINOPHEN 325 MG PO TABS
650.0000 mg | ORAL_TABLET | Freq: Once | ORAL | Status: AC
  Administered 2015-01-15: 650 mg via ORAL
  Filled 2015-01-15: qty 2

## 2015-01-15 MED ORDER — LEVOFLOXACIN IN D5W 500 MG/100ML IV SOLN
500.0000 mg | Freq: Once | INTRAVENOUS | Status: AC
  Administered 2015-01-15: 500 mg via INTRAVENOUS
  Filled 2015-01-15: qty 100

## 2015-01-15 MED ORDER — AZITHROMYCIN 250 MG PO TABS
500.0000 mg | ORAL_TABLET | Freq: Once | ORAL | Status: DC
  Filled 2015-01-15: qty 2

## 2015-01-15 MED ORDER — KETOROLAC TROMETHAMINE 30 MG/ML IJ SOLN
30.0000 mg | Freq: Once | INTRAMUSCULAR | Status: AC
  Administered 2015-01-15: 30 mg via INTRAVENOUS
  Filled 2015-01-15: qty 1

## 2015-01-15 MED ORDER — DEXTROSE 5 % IV SOLN
1.0000 g | Freq: Once | INTRAVENOUS | Status: DC
  Filled 2015-01-15: qty 10

## 2015-01-15 MED ORDER — LACTATED RINGERS IV BOLUS (SEPSIS)
2000.0000 mL | Freq: Once | INTRAVENOUS | Status: AC
  Administered 2015-01-15: 2000 mL via INTRAVENOUS

## 2015-01-15 NOTE — ED Notes (Signed)
She states that she has felt "real weak" at work all week, with pain between her shoulder blades which at times radiates to left thoracic/ribs area.  She also c/o occasional "squeezing" chest discomfort.  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  She is in no distress.

## 2015-01-15 NOTE — ED Notes (Signed)
Lactic drawn at 2239 was drawn from same site LR was being infused in for the past few hours. MD aware. Will re-draw from opposite site.

## 2015-01-15 NOTE — ED Provider Notes (Signed)
CSN: [id number]     Arrival date & time 01/15/15  1832 History   First MD Initiated Contact with Patient 01/15/15 1916     Chief Complaint  Patient presents with  . Chest Pain     (Consider location/radiation/quality/duration/timing/severity/associated sxs/prior Treatment) HPI Comments: Pt comes in with multiple complains. She has hx of meningitis, fibromyalgia. Pt has been feeling unwell for 1 + month. Pt reports that her main complains is chest pain. Chest pain has been off and on for 2 years now, since she had a pneumonia. Chest pain is located on the L side, between her scapula and also below the L breast. The pain are separate. Pt's pain is provoked by exertion - usually with any activity. Pt gets exhausted now with exertion. 2 weeks ago she was walking a couple of miles and able to jog- but now minimal exertion gets her exhausted and she has chest pains as well. + cough - chest pain gets worse with coughing. Phlegm is yellow and clear. Pt has a temp of 100.1 currently. She also reports chills, no sweats. No recent travel hx. Pt has no hx of PE, DVT and denies any exogenous estrogen use, long distance travels or surgery in the past 6 weeks, active cancer, recent immobilization. No CAD hx, pt has no copd hx, she is not a smoker and doesn't use any drugs.   ROS 10 Systems reviewed and are negative for acute change except as noted in the HPI.     Patient is a 49 y.o. female presenting with chest pain. The history is provided by the patient.  Chest Pain Associated symptoms: cough and shortness of breath     Past Medical History  Diagnosis Date  . Fibromyalgia   . Meningitis   . Thyroid disease   . Arthritis   . Chronic cough   . Chronic headache   . Generalized weakness   . Paresthesias     chronic  . PNA (pneumonia)     "a year ago"   No past surgical history on file. Family History  Problem Relation Age of Onset  . Allergies Child   . Asthma Grandchild   . Heart  disease Father     5 stents  . Rheum arthritis Mother   . Cancer - Cervical Sister    Social History  Substance Use Topics  . Smoking status: Never Smoker   . Smokeless tobacco: Never Used  . Alcohol Use: Yes     Comment: occasional sangria   OB History    No data available     Review of Systems  Respiratory: Positive for cough and shortness of breath.   Cardiovascular: Positive for chest pain.      Allergies  Ceclor; Methimazole; and Percocet  Home Medications   Prior to Admission medications   Medication Sig Start Date End Date Taking? Authorizing Provider  HYDROcodone-acetaminophen (NORCO/VICODIN) 5-325 MG per tablet Take 1-2 tablets by mouth every 6 (six) hours as needed for moderate pain.   Yes Historical Provider, MD  ibuprofen (ADVIL,MOTRIN) 200 MG tablet Take 400 mg by mouth every 6 (six) hours as needed for pain.   Yes Historical Provider, MD  Multiple Vitamin (MULTIVITAMIN WITH MINERALS) TABS tablet Take 0.5 tablets by mouth daily.    Yes Historical Provider, MD  levofloxacin (LEVAQUIN) 500 MG tablet Take 1 tablet (500 mg total) by mouth daily. 01/16/15   Janelie Goltz, MD   BP 123/82 mmHg  Pulse 115  Temp(Src) 99.5 F (  37.5 C) (Oral)  Resp 22  Wt 203 lb 8 oz (92.307 kg)  SpO2 95% Physical Exam  Constitutional: She is oriented to person, place, and time. She appears well-developed.  HENT:  Head: Normocephalic and atraumatic.  Eyes: Conjunctivae and EOM are normal. Pupils are equal, round, and reactive to light.  Neck: Normal range of motion. Neck supple.  Cardiovascular: Normal rate, regular rhythm and normal heart sounds.   Pulmonary/Chest: Effort normal and breath sounds normal. No respiratory distress.  Abdominal: Soft. Bowel sounds are normal. She exhibits no distension. There is no tenderness. There is no rebound and no guarding.  Neurological: She is alert and oriented to person, place, and time.  Skin: Skin is warm and dry.  Nursing note and  vitals reviewed.   ED Course  Procedures (including critical care time) Labs Review Labs Reviewed  BASIC METABOLIC PANEL - Abnormal; Notable for the following:    Glucose, Bld 122 (*)    All other components within normal limits  CBC - Abnormal; Notable for the following:    WBC 15.0 (*)    RBC 5.20 (*)    Hemoglobin 16.6 (*)    HCT 47.0 (*)    All other components within normal limits  I-STAT CG4 LACTIC ACID, ED - Abnormal; Notable for the following:    Lactic Acid, Venous 2.78 (*)    All other components within normal limits  I-STAT CG4 LACTIC ACID, ED  I-STAT TROPOININ, ED  I-STAT CG4 LACTIC ACID, ED  I-STAT CG4 LACTIC ACID, ED  I-STAT CG4 LACTIC ACID, ED  I-STAT CG4 LACTIC ACID, ED    Imaging Review Dg Chest 2 View  01/15/2015   CLINICAL DATA:  Patient with weakness. Pain between the shoulder blades.  EXAM: CHEST  2 VIEW  COMPARISON:  Chest radiograph 12/31/2013  FINDINGS: Multiple monitoring leads overlie the patient. Stable cardiac and mediastinal contours. No consolidative pulmonary opacities. No pleural effusion or pneumothorax. Mid thoracic spine degenerative changes.  IMPRESSION: No acute cardiopulmonary process.   Electronically Signed   By: Lovey Newcomer M.D.   On: 01/15/2015 19:38   I have personally reviewed and evaluated these images and lab results as part of my medical decision-making.   EKG Interpretation   Date/Time:  Saturday January 15 2015 18:49:42 EDT Ventricular Rate:  124 PR Interval:  120 QRS Duration: 78 QT Interval:  294 QTC Calculation: 422 R Axis:   73 Text Interpretation:  Sinus tachycardia Multiform ventricular premature  complexes LAE, consider biatrial enlargement Nonspecific T abnormalities,  diffuse leads Baseline wander in lead(s) I II aVR rate increased since  last tracing s1q3t3 is new Reconfirmed by Caron Ode, MD, Carliss Quast [id number]) on  01/15/2015 7:59:20 PM      @12 :15: Ambulatory pulsox at midnight is 95%. HR is 110-115. She is not  in resp distress with ambulation. We will clinically treat the patient for a CAP. The results from the ER workup discussed with the patient. Pt's questions and concerns responded to patient's satisfaction. I discussed with her that the clinical decision making tools validate a discharge at this time -but with strict return precautions. She is immunocompetent. Family at bedside. They are comfortable keeping close eye on her. They all are made aware of all the return precautions. If she returns - we will likely have to do PE workup.  @12 :45: Pt's headache is a lot better. Again asked if she is comfortable going home and with the plan in place - she nods yes, and family  endorses the plan as well. Informed her that i will be working again tomorrow at [employer], if she is to get worse, she can come back to [employer] anytime.     MDM   Final diagnoses:  CAP (community acquired pneumonia)  SIRS (systemic inflammatory response syndrome)    Pt comes in with fevers, elevated HR, and RR.  She is c/o atypical chest pain - but the pain is pleuritic.  She also has a cough. She has an elevated WC. Lung exam is normal and CXR is clear - but with the triad of fever, cough, pleuritic chest pain + leukocytosis - i still think pt is having CAP. Moreover, she has no risk factors for PE, and the chart review shows + pneumonia on CT with neg CXR. Will give ivf, trend lactate. Her CURB65 score is 0, PORT score puts her at class II - suggesting outpatient workup is fine.  Trops neg. EKG does show s1q3t3.    Varney Biles, MD 01/16/15 [phone]

## 2015-01-15 NOTE — ED Notes (Signed)
Pulse ox noted to be 95-99 % during ambulation. MD aware.

## 2015-01-16 ENCOUNTER — Emergency Department (HOSPITAL_COMMUNITY): Payer: 59

## 2015-01-16 ENCOUNTER — Encounter (HOSPITAL_COMMUNITY): Payer: Self-pay | Admitting: Emergency Medicine

## 2015-01-16 ENCOUNTER — Emergency Department (HOSPITAL_COMMUNITY)
Admission: EM | Admit: 2015-01-16 | Discharge: 2015-01-17 | Disposition: A | Discharge status: Home / Self Care | Payer: 59 | Source: Home / Self Care | Attending: Emergency Medicine | Admitting: Emergency Medicine

## 2015-01-16 DIAGNOSIS — R112 Nausea with vomiting, unspecified: Secondary | ICD-10-CM

## 2015-01-16 DIAGNOSIS — R059 Cough, unspecified: Secondary | ICD-10-CM

## 2015-01-16 DIAGNOSIS — R05 Cough: Secondary | ICD-10-CM

## 2015-01-16 MED ORDER — LEVOFLOXACIN 500 MG PO TABS: 500.0000 mg | ORAL_TABLET | Freq: Every day | ORAL | Status: DC

## 2015-01-16 NOTE — Discharge Instructions (Signed)
We saw you in the ER for the weakness, fevers, chest pain, cough. ER workup shows that you have infection. Although the chest xray was normal - we think you are having a pneumonia - given the cough, chest pain with cough, fevers, elevated infection markers on your blood test.  We anticipate the symptoms to improve with antibiotics and fluids at home.  Please return to the ER if your symptoms worsen; you have increased pain, fevers, chills, shortness of breath, inability to keep any medications down, confusion, fainting.   Pneumonia Pneumonia is an infection of the lungs.  CAUSES Pneumonia may be caused by bacteria or a virus. Usually, these infections are caused by breathing infectious particles into the lungs (respiratory tract). SIGNS AND SYMPTOMS   Cough.  Fever.  Chest pain.  Increased rate of breathing.  Wheezing.  Mucus production. DIAGNOSIS  If you have the common symptoms of pneumonia, your health care provider will typically confirm the diagnosis with a chest X-ray. The X-ray will show an abnormality in the lung (pulmonary infiltrate) if you have pneumonia. Other tests of your blood, urine, or sputum may be done to find the specific cause of your pneumonia. Your health care provider may also do tests (blood gases or pulse oximetry) to see how well your lungs are working. TREATMENT  Some forms of pneumonia may be spread to other people when you cough or sneeze. You may be asked to wear a mask before and during your exam. Pneumonia that is caused by bacteria is treated with antibiotic medicine. Pneumonia that is caused by the influenza virus may be treated with an antiviral medicine. Most other viral infections must run their course. These infections will not respond to antibiotics.  HOME CARE INSTRUCTIONS   Cough suppressants may be used if you are losing too much rest. However, coughing protects you by clearing your lungs. You should avoid using cough suppressants if you  can.  Your health care provider may have prescribed medicine if he or she thinks your pneumonia is caused by bacteria or influenza. Finish your medicine even if you start to feel better.  Your health care provider may also prescribe an expectorant. This loosens the mucus to be coughed up.  Take medicines only as directed by your health care provider.  Do not smoke. Smoking is a common cause of bronchitis and can contribute to pneumonia. If you are a smoker and continue to smoke, your cough may last several weeks after your pneumonia has cleared.  A cold steam vaporizer or humidifier in your room or home may help loosen mucus.  Coughing is often worse at night. Sleeping in a semi-upright position in a recliner or using a couple pillows under your head will help with this.  Get rest as you feel it is needed. Your body will usually let you know when you need to rest. PREVENTION A pneumococcal shot (vaccine) is available to prevent a common bacterial cause of pneumonia. This is usually suggested for:  People over 41 years old.  Patients on chemotherapy.  People with chronic lung problems, such as bronchitis or emphysema.  People with immune system problems. If you are over 65 or have a high risk condition, you may receive the pneumococcal vaccine if you have not received it before. In some countries, a routine influenza vaccine is also recommended. This vaccine can help prevent some cases of pneumonia.You may be offered the influenza vaccine as part of your care. If you smoke, it is time to  quit. You may receive instructions on how to stop smoking. Your health care provider can provide medicines and counseling to help you quit. SEEK MEDICAL CARE IF: You have a fever. SEEK IMMEDIATE MEDICAL CARE IF:   Your illness becomes worse. This is especially true if you are elderly or weakened from any other disease.  You cannot control your cough with suppressants and are losing sleep.  You  begin coughing up blood.  You develop pain which is getting worse or is uncontrolled with medicines.  Any of the symptoms which initially brought you in for treatment are getting worse rather than better.  You develop shortness of breath or chest pain. MAKE SURE YOU:   Understand these instructions.  Will watch your condition.  Will get help right away if you are not doing well or get worse. Document Released: 04/09/2005 Document Revised: 08/24/2013 Document Reviewed: 06/29/2010 [facility] Patient Information 2015 [place], [place]. This information is not intended to replace advice given to you by your health care provider. Make sure you discuss any questions you have with your health care provider.

## 2015-01-16 NOTE — ED Notes (Signed)
Pt states she was seen here last night and was diagnosed with pneumonia  Pt states she was told if she felt worse to return  Pt states she vomited once before she left here last night and has been vomiting ever since  Pt states she has been exhausted today and is having a hard time staying awake , having chills, headache, dizziness, states her balance feels off, chest pain with cough and deep inspiration

## 2015-01-17 MED ORDER — ONDANSETRON HCL 4 MG PO TABS: 4.0000 mg | ORAL_TABLET | Freq: Four times a day (QID) | ORAL | Status: DC

## 2015-01-17 MED ORDER — HYDROCODONE-HOMATROPINE 5-1.5 MG/5ML PO SYRP: 5.0000 mL | ORAL_SOLUTION | Freq: Four times a day (QID) | ORAL | Status: DC | PRN

## 2015-01-17 MED ORDER — ONDANSETRON 8 MG PO TBDP
8.0000 mg | ORAL_TABLET | Freq: Once | ORAL | Status: AC
  Administered 2015-01-17: 8 mg via ORAL
  Filled 2015-01-17: qty 1

## 2015-01-17 NOTE — ED Notes (Signed)
Pt was seen here last night & dx with pneumonia. States that she began vomiting upon d/c and has continued to vomit since. Denies blood in emesis. Pt also c/o chest pain to L chest that involves SOB. 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

## 2015-01-17 NOTE — ED Notes (Signed)
Reviewed discharge instructions and prescriptions with pt, who voiced understanding. Pt departed in a wheelchair, assisted by family members, and in NAD.

## 2015-01-17 NOTE — Discharge Instructions (Signed)
Cough, Adult ° A cough is a reflex. It helps you clear your throat and airways. A cough can help heal your body. A cough can last 2 or 3 weeks (acute) or may last more than 8 weeks (chronic). Some common causes of a cough can include an infection, allergy, or a cold. °HOME CARE °· Only take medicine as told by your doctor. °· If given, take your medicines (antibiotics) as told. Finish them even if you start to feel better. °· Use a cold steam vaporizer or humidifier in your home. This can help loosen thick spit (secretions). °· Sleep so you are almost sitting up (semi-upright). Use pillows to do this. This helps reduce coughing. °· Rest as needed. °· Stop smoking if you smoke. °GET HELP RIGHT AWAY IF: °· You have yellowish-white fluid (pus) in your thick spit. °· Your cough gets worse. °· Your medicine does not reduce coughing, and you are losing sleep. °· You cough up blood. °· You have trouble breathing. °· Your pain gets worse and medicine does not help. °· You have a fever. °MAKE SURE YOU:  °· Understand these instructions. °· Will watch your condition. °· Will get help right away if you are not doing well or get worse. °Document Released: 12/21/2010 Document Revised: 08/24/2013 Document Reviewed: 12/21/2010 °[employer]® Patient Information ©2015 [employer]. This information is not intended to replace advice given to you by your health care provider. Make sure you discuss any questions you have with your health care provider. ° °Viral Infections °A virus is a type of germ. Viruses can cause: °· Minor sore throats. °· Aches and pains. °· Headaches. °· Runny nose. °· Rashes. °· Watery eyes. °· Tiredness. °· Coughs. °· Loss of appetite. °· Feeling sick to your stomach (nausea). °· Throwing up (vomiting). °· Watery poop (diarrhea). °HOME CARE  °· Only take medicines as told by your doctor. °· Drink enough water and fluids to keep your pee (urine) clear or pale yellow. Sports drinks are a good choice. °· Get plenty  of rest and eat healthy. Soups and broths with crackers or rice are fine. °GET HELP RIGHT AWAY IF:  °· You have a very bad headache. °· You have shortness of breath. °· You have chest pain or neck pain. °· You have an unusual rash. °· You cannot stop throwing up. °· You have watery poop that does not stop. °· You cannot keep fluids down. °· You or your child has a temperature by mouth above 102° F (38.9° C), not controlled by medicine. °· Your baby is older than 3 months with a rectal temperature of 102° F (38.9° C) or higher. °· Your baby is 3 months old or younger with a rectal temperature of 100.4° F (38° C) or higher. °MAKE SURE YOU:  °· Understand these instructions. °· Will watch this condition. °· Will get help right away if you are not doing well or get worse. °Document Released: 03/22/2008 Document Revised: 07/02/2011 Document Reviewed: 08/15/2010 °[employer]® Patient Information ©2015 [employer]. This information is not intended to replace advice given to you by your health care provider. Make sure you discuss any questions you have with your health care provider. ° °

## 2015-01-17 NOTE — ED Provider Notes (Signed)
CSN: [id number]     Arrival date & time 01/16/15  2228 History   First MD Initiated Contact with Patient 01/16/15 2333     Chief Complaint  Patient presents with  . Pneumonia     (Consider location/radiation/quality/duration/timing/severity/associated sxs/prior Treatment) HPI Comments: Patient presents to the emergency department with chief complaint of cough. She states that the symptoms started a week or so ago. She was seen yesterday and was diagnosed with community-acquired pneumonia. Patient states that she has been taking her Levaquin as to rectum. She states that she has had several episodes of vomiting today. This is what brought her back to the emergency department. She states that when she coughs, she has chest pain. She denies any history of PE or DVT. Denies any history of ACS. She states that the chest pain is only present when she is coughing. She denies any abdominal pain. Denies any diarrhea.  The history is provided by the patient. No language interpreter was used.    Past Medical History  Diagnosis Date  . Fibromyalgia   . Meningitis   . Thyroid disease   . Arthritis   . Chronic cough   . Chronic headache   . Generalized weakness   . Paresthesias     chronic  . PNA (pneumonia)     "a year ago"   History reviewed. No pertinent past surgical history. Family History  Problem Relation Age of Onset  . Allergies Child   . Asthma Grandchild   . Heart disease Father     5 stents  . Rheum arthritis Mother   . Cancer - Cervical Sister    Social History  Substance Use Topics  . Smoking status: Never Smoker   . Smokeless tobacco: Never Used  . Alcohol Use: Yes     Comment: occasional sangria   OB History    No data available     Review of Systems  Constitutional: Positive for fever and chills.  HENT: Positive for postnasal drip, rhinorrhea, sinus pressure, sneezing and sore throat.   Respiratory: Positive for cough. Negative for shortness of breath.    Cardiovascular: Negative for chest pain.  Gastrointestinal: Negative for nausea, vomiting, abdominal pain, diarrhea and constipation.  Genitourinary: Negative for dysuria.      Allergies  Ceclor; Methimazole; and Percocet  Home Medications   Prior to Admission medications   Medication Sig Start Date End Date Taking? Authorizing Provider  HYDROcodone-acetaminophen (NORCO/VICODIN) 5-325 MG per tablet Take 1-2 tablets by mouth every 6 (six) hours as needed for moderate pain.   Yes Historical Provider, MD  ibuprofen (ADVIL,MOTRIN) 200 MG tablet Take 400 mg by mouth every 6 (six) hours as needed for pain.   Yes Historical Provider, MD  levofloxacin (LEVAQUIN) 500 MG tablet Take 1 tablet (500 mg total) by mouth daily. 01/16/15  Yes Varney Biles, MD  Multiple Vitamin (MULTIVITAMIN WITH MINERALS) TABS tablet Take 0.5 tablets by mouth daily.    Yes Historical Provider, MD   BP 135/91 mmHg  Pulse 84  Temp(Src) 98.1 F (36.7 C) (Oral)  Resp 16  SpO2 99% Physical Exam  Constitutional: She appears well-developed and well-nourished. No distress.  HENT:  Head: Normocephalic.  Right Ear: External ear normal.  Left Ear: External ear normal.  Mildly erythematous, no tonsillar exudate, no abscess, no stridor, uvula is midline  TMs clear bilaterally  Eyes: Conjunctivae and EOM are normal. Pupils are equal, round, and reactive to light.  Neck: Normal range of motion. Neck supple.  Cardiovascular: Normal rate, regular rhythm and normal heart sounds.  Exam reveals no gallop and no friction rub.   No murmur heard. Pulmonary/Chest: Effort normal and breath sounds normal. No stridor. No respiratory distress. She has no wheezes. She has no rales. She exhibits no tenderness.  CTAB  Abdominal: Soft. Bowel sounds are normal. She exhibits no distension. There is no tenderness.  Poor dentition throughout.  Affected tooth as diagrammed.  No signs of peritonsillar or tonsillar abscess.  No signs of  gingival abscess. Oropharynx is clear and without exudates.  Uvula is midline.  Airway is intact. No signs of Ludwig's angina with palpation of oral and sublingual mucosa.   Musculoskeletal: Normal range of motion. She exhibits no tenderness.  Neurological: She is alert.  Skin: Skin is warm and dry. No rash noted. She is not diaphoretic.  Psychiatric: She has a normal mood and affect. Her behavior is normal. Judgment and thought content normal.  Nursing note and vitals reviewed.   ED Course  Procedures (including critical care time) Labs Review Labs Reviewed - No data to display  Imaging Review Dg Chest 2 View  01/17/2015   CLINICAL DATA:  Left chest pain and shortness of breath  EXAM: CHEST  2 VIEW  COMPARISON:  01/15/2015  FINDINGS: Normal heart size and mediastinal contours. No acute infiltrate or edema. No effusion or pneumothorax. No acute osseous findings.  IMPRESSION: Negative chest.   Electronically Signed   By: Monte Fantasia M.D.   On: 01/17/2015 00:18   Dg Chest 2 View  01/15/2015   CLINICAL DATA:  Patient with weakness. Pain between the shoulder blades.  EXAM: CHEST  2 VIEW  COMPARISON:  Chest radiograph 12/31/2013  FINDINGS: Multiple monitoring leads overlie the patient. Stable cardiac and mediastinal contours. No consolidative pulmonary opacities. No pleural effusion or pneumothorax. Mid thoracic spine degenerative changes.  IMPRESSION: No acute cardiopulmonary process.   Electronically Signed   By: Lovey Newcomer M.D.   On: 01/15/2015 19:38   I have personally reviewed and evaluated these images and lab results as part of my medical decision-making.   EKG Interpretation None      MDM   Final diagnoses:  Cough  Nausea and vomiting, vomiting of unspecified type    Patient with cough. Symptoms have been ongoing for weeks or so. Patient is currently taking Levaquin for pneumonia. She was seen yesterday for the same. Her vital signs have improved significantly. No fever,  pulse is regular, respiration rate normal, O2 saturation is 99% on room air. Patient states that she came back because she has had some vomiting today. Is quite possible that she has a viral illness, or that she is having an upset stomach from the [place]. I will give her some Zofran and recheck her chest x-ray. Patient offered to have additional labs done today, but she declines. She states that she does not want to be "stuck anymore."  I feel this is appropriate.    chest x-ray is negative tonight. Patient does not appear to be in any distress.  Will fluid challenge patient. Plan for discharge to home with Zofran. Patient understands and agrees with the plan. Specific return precautions have been given.    Montine Circle, PA-C 01/17/15 [id number]  Rolland Porter, MD 01/17/15 [id number]

## 2015-09-08 ENCOUNTER — Other Ambulatory Visit (HOSPITAL_COMMUNITY)
Admission: RE | Admit: 2015-09-08 | Discharge: 2015-09-08 | Disposition: A | Discharge status: Home / Self Care | Payer: BLUE CROSS/BLUE SHIELD | Source: Ambulatory Visit | Attending: Family Medicine | Admitting: Family Medicine

## 2015-09-08 ENCOUNTER — Other Ambulatory Visit: Payer: Self-pay | Admitting: Physician Assistant

## 2015-09-08 DIAGNOSIS — Z124 Encounter for screening for malignant neoplasm of cervix: Secondary | ICD-10-CM | POA: Diagnosis present

## 2015-09-09 ENCOUNTER — Other Ambulatory Visit: Payer: Self-pay | Admitting: Physician Assistant

## 2015-09-09 DIAGNOSIS — Z1231 Encounter for screening mammogram for malignant neoplasm of breast: Secondary | ICD-10-CM

## 2015-09-13 LAB — CYTOLOGY - PAP

## 2015-09-29 ENCOUNTER — Ambulatory Visit: Payer: No Typology Code available for payment source

## 2015-09-29 ENCOUNTER — Ambulatory Visit: Payer: No Typology Code available for payment source | Admitting: Internal Medicine

## 2015-09-30 ENCOUNTER — Encounter: Payer: Self-pay | Admitting: Internal Medicine

## 2015-10-06 ENCOUNTER — Ambulatory Visit: Payer: BLUE CROSS/BLUE SHIELD | Admitting: Internal Medicine

## 2015-10-07 ENCOUNTER — Encounter: Payer: Self-pay | Admitting: Internal Medicine

## 2015-11-03 ENCOUNTER — Ambulatory Visit
Admission: RE | Admit: 2015-11-03 | Discharge: 2015-11-03 | Disposition: A | Discharge status: Home / Self Care | Payer: BLUE CROSS/BLUE SHIELD | Source: Ambulatory Visit | Attending: Physician Assistant | Admitting: Physician Assistant

## 2015-11-03 DIAGNOSIS — Z1231 Encounter for screening mammogram for malignant neoplasm of breast: Secondary | ICD-10-CM

## 2016-07-13 ENCOUNTER — Encounter: Payer: Self-pay | Admitting: Neurology

## 2016-08-17 ENCOUNTER — Ambulatory Visit: Payer: BLUE CROSS/BLUE SHIELD | Admitting: Neurology

## 2016-09-10 ENCOUNTER — Other Ambulatory Visit: Payer: Self-pay | Admitting: Physician Assistant

## 2016-09-10 ENCOUNTER — Other Ambulatory Visit (HOSPITAL_COMMUNITY)
Admission: RE | Admit: 2016-09-10 | Discharge: 2016-09-10 | Disposition: A | Discharge status: Home / Self Care | Payer: BLUE CROSS/BLUE SHIELD | Source: Ambulatory Visit | Attending: Family Medicine | Admitting: Family Medicine

## 2016-09-10 DIAGNOSIS — Z124 Encounter for screening for malignant neoplasm of cervix: Secondary | ICD-10-CM | POA: Insufficient documentation

## 2016-09-13 LAB — CYTOLOGY - PAP: Diagnosis: NEGATIVE

## 2016-09-18 ENCOUNTER — Ambulatory Visit (INDEPENDENT_AMBULATORY_CARE_PROVIDER_SITE_OTHER): Payer: BLUE CROSS/BLUE SHIELD | Admitting: Neurology

## 2016-09-18 ENCOUNTER — Encounter: Payer: Self-pay | Admitting: Neurology

## 2016-09-18 VITALS — BP 120/78 | HR 87 | Ht 62.0 in | Wt 201.3 lb

## 2016-09-18 DIAGNOSIS — R209 Unspecified disturbances of skin sensation: Secondary | ICD-10-CM | POA: Diagnosis not present

## 2016-09-18 DIAGNOSIS — R202 Paresthesia of skin: Secondary | ICD-10-CM

## 2016-09-18 NOTE — Progress Notes (Signed)
[facility] Clinic Note - Initial Visit   Date: 09/18/16  [name] MRN: [id number] DOB: 1965-10-12   Dear Lennie Odor, PA-C:  Thank you for your kind referral of Donna Norton for consultation of paresthesias. Although her history is well known to you, please allow [place] to reiterate it for the purpose of our medical record. The patient was accompanied to the clinic by self.   History of Present Illness: Donna Norton is a 51 y.o. right-handed African American female with fibromyalgia, hyperthyroidism, RSD, OA,  presenting for evaluation of generalized paresthesias.    Starting around early 2017, she began having numbness/tingling over the hands, arms, feet, right frontal scalp.  Symptoms are sporadic and improved by rest.  Stress can trigger her "flares" and she feels that stress is "absorbed in my body".  During the flares, she can stay in bed all day and feel tired.  Bad weather makes her fatigued and she has low energy.  She also complains of right eye twitch.  She was started on Cymbalta a few months ago and she has noticed a huge improvement in her pain, fatigue, and paresthesias.  She also complains of generalized weakness such as dropping objects.  She denies any imbalance or falls.      Out-side paper records, electronic medical record, and images have been reviewed where available and summarized as:  Labs 06/16/2016:  RF neg, ESR 47 Labs 2017:  TSH 2.53  Past Medical History:  Diagnosis Date  . Arthritis   . Chronic cough   . Chronic headache   . Fibromyalgia   . Generalized weakness   . Meningitis   . Paresthesias    chronic  . PNA (pneumonia)    "a year ago"  . Thyroid disease     History reviewed. No pertinent surgical history.   Medications:  Outpatient Encounter Prescriptions as of 09/18/2016  Medication Sig Note  . diclofenac (VOLTAREN) 50 MG EC tablet Take 50 mg by mouth 2 (two) times daily with a meal.   . DULoxetine  (CYMBALTA) 60 MG capsule    . Flaxseed, Linseed, (FLAX SEED OIL PO) Take by mouth.   . Multiple Vitamin (MULTIVITAMIN WITH MINERALS) TABS tablet Take 0.5 tablets by mouth daily.    . [DISCONTINUED] HYDROcodone-acetaminophen (NORCO/VICODIN) 5-325 MG per tablet Take 1-2 tablets by mouth every 6 (six) hours as needed for moderate pain. 01/17/2015: .  Marland Kitchen [DISCONTINUED] HYDROcodone-homatropine (HYCODAN) 5-1.5 MG/5ML syrup Take 5 mLs by mouth every 6 (six) hours as needed for cough.   . [DISCONTINUED] ibuprofen (ADVIL,MOTRIN) 200 MG tablet Take 400 mg by mouth every 6 (six) hours as needed for pain.   . [DISCONTINUED] levofloxacin (LEVAQUIN) 500 MG tablet Take 1 tablet (500 mg total) by mouth daily.   . [DISCONTINUED] ondansetron (ZOFRAN) 4 MG tablet Take 1 tablet (4 mg total) by mouth every 6 (six) hours.    No facility-administered encounter medications on file as of 09/18/2016.      Allergies:  Allergies  Allergen Reactions  . Ceclor [Cefaclor] Hives  . Methimazole [Methimazole] Other (See Comments)    Liver   . Percocet [Oxycodone-Acetaminophen] Hives    Family History: Family History  Problem Relation Age of Onset  . Rheum arthritis Mother   . Heart disease Father        5 stents  . Allergies Child   . Asthma Grandchild   . Cancer - Cervical Sister     Social History: Social History  Substance Use Topics  . Smoking status: Never Smoker  . Smokeless tobacco: Never Used  . Alcohol use Yes     Comment: occasional sangria   Social History   Social History Narrative   Lives with son in a one story home.  Has 3 children.  Works as an Marine scientist for [employer].  Education: some college.      Review of Systems:  CONSTITUTIONAL: No fevers, chills, night sweats, or weight loss.   EYES: No visual changes or eye pain ENT: No hearing changes.  No history of nose bleeds.   RESPIRATORY: No cough, wheezing and shortness of breath.   CARDIOVASCULAR: Negative for chest pain, and  palpitations.   GI: Negative for abdominal discomfort, blood in stools or black stools.  No recent change in bowel habits.   GU:  No history of incontinence.   MUSCLOSKELETAL: +history of joint pain or swelling.  +myalgias.   SKIN: Negative for lesions, rash, and itching.   HEMATOLOGY/ONCOLOGY: Negative for prolonged bleeding, bruising easily, and swollen nodes.  No history of cancer.   ENDOCRINE: Negative for cold or heat intolerance, polydipsia or goiter.   PSYCH:  No depression +anxiety symptoms.   NEURO: As Above.   Vital Signs:  BP 120/78   Pulse 87   Ht '5\' 2"'  (1.575 m)   Wt 201 lb 5 oz (91.3 kg)   SpO2 95%   BMI 36.82 kg/m    General Medical Exam:   General:  Well appearing, comfortable.   Eyes/ENT: see cranial nerve examination.   Neck: No masses appreciated.  Full range of motion without tenderness.  No carotid bruits. Respiratory:  Clear to auscultation, good air entry bilaterally.   Cardiac:  Regular rate and rhythm, no murmur.   Extremities:  No deformities, edema, or skin discoloration.  Skin:  No rashes or lesions.  Neurological Exam: MENTAL STATUS including orientation to time, place, person, recent and remote memory, attention span and concentration, language, and fund of knowledge is normal.  Speech is not dysarthric.  CRANIAL NERVES: II:  No visual field defects.  Unremarkable fundi.   III-IV-VI: Pupils equal round and reactive to light.  Normal conjugate, extra-ocular eye movements in all directions of gaze.  No nystagmus.  No ptosis.   V:  Normal facial sensation.     VII:  Normal facial symmetry and movements.  No pathologic facial reflexes.  VIII:  Normal hearing and vestibular function.   IX-X:  Normal palatal movement.   XI:  Normal shoulder shrug and head rotation.   XII:  Normal tongue strength and range of motion, no deviation or fasciculation.  MOTOR:  No atrophy, fasciculations or abnormal movements.  No pronator drift.  Tone is normal.    Right  Upper Extremity:    Left Upper Extremity:    Deltoid  5/5   Deltoid  5/5   Biceps  5/5   Biceps  5/5   Triceps  5/5   Triceps  5/5   Wrist extensors  5/5   Wrist extensors  5/5   Wrist flexors  5/5   Wrist flexors  5/5   Finger extensors  5/5   Finger extensors  5/5   Finger flexors  5/5   Finger flexors  5/5   Dorsal interossei  5/5   Dorsal interossei  5/5   Abductor pollicis  5/5   Abductor pollicis  5/5   Tone (Ashworth scale)  0  Tone (Ashworth scale)  0   Right  Lower Extremity:    Left Lower Extremity:    Hip flexors  5/5   Hip flexors  5/5   Hip extensors  5/5   Hip extensors  5/5   Knee flexors  5/5   Knee flexors  5/5   Knee extensors  5/5   Knee extensors  5/5   Dorsiflexors  5/5   Dorsiflexors  5/5   Plantarflexors  5/5   Plantarflexors  5/5   Toe extensors  5/5   Toe extensors  5/5   Toe flexors  5/5   Toe flexors  5/5   Tone (Ashworth scale)  0  Tone (Ashworth scale)  0   MSRs:  Right                                                                 Left brachioradialis 2+  brachioradialis 2+  biceps 2+  biceps 2+  triceps 2+  triceps 2+  patellar 2+  patellar 2+  ankle jerk 2+  ankle jerk 2+  Hoffman no  Hoffman no  plantar response down  plantar response down   SENSORY:  Normal and symmetric perception of light touch, pinprick, vibration, and proprioception.  Romberg's sign absent.   COORDINATION/GAIT: Normal finger-to- nose-finger and heel-to-shin.  Intact rapid alternating movements bilaterally.  Able to rise from a chair without using arms.  Gait narrow based and stable.  Heel and tandem gait intact. She has pain which limits toe walking.   IMPRESSION: Ms. Gilday is a 51 year-old female referred for evaluation of sporadic and migratory paresthesias.  Her exam is entirely normal and non-focal, so patient was reassured that my overall suspicion for anything worrisome is low.  Her paresthesias and pain have improved with Cymbalta suggesting that this may also be a  manifestation of pain syndrome such as fibromyalgia.  To be complete, she will have MRI brain to evaluate for demyelinating disease and labs for paresthesias.  Because she has identified stress as a trigger, I suggested that she consider seeing a counselor for coping mechanisms for chronic pain.  PLAN/RECOMMENDATIONS:  1.  Check vitamin B12, folate 2.  MRI brain wwo contrast 3.  Consider NCS/EMG going forward, but with normal exam and migratory symptoms the likelihood of findings peripheral nerve pathology is very low  The duration of this appointment visit was 35 minutes of face-to-face time with the patient.  Greater than 50% of this time was spent in counseling, explanation of diagnosis, planning of further management, and coordination of care.   Thank you for allowing me to participate in patient's care.  If I can answer any additional questions, I would be pleased to do so.    Sincerely,    Caulder Wehner K. Posey Pronto, DO

## 2016-09-18 NOTE — Patient Instructions (Addendum)
1.  MRI brain wwo contrast 2.  Check labs 3.  Consider seeing a counselor for coping mechanisms for your pain to optimize your quality of life  We will call you with the results

## 2016-09-19 ENCOUNTER — Other Ambulatory Visit: Payer: BLUE CROSS/BLUE SHIELD

## 2016-09-19 DIAGNOSIS — R202 Paresthesia of skin: Secondary | ICD-10-CM

## 2016-09-19 DIAGNOSIS — R209 Unspecified disturbances of skin sensation: Principal | ICD-10-CM

## 2016-09-20 ENCOUNTER — Telehealth: Payer: Self-pay | Admitting: *Deleted

## 2016-09-20 LAB — FOLATE: Folate: 23.8 ng/mL (ref >5.4)

## 2016-09-20 LAB — VITAMIN B12: Vitamin B-12: 601 pg/mL (ref 200–1100)

## 2016-09-20 NOTE — Telephone Encounter (Signed)
Left message informing patient.

## 2016-09-20 NOTE — Telephone Encounter (Signed)
-----   Message from Alda Berthold, DO sent at 09/20/2016 11:16 AM EDT ----- Please notify patient lab are within normal limits.  Thank you.

## 2016-10-19 ENCOUNTER — Ambulatory Visit: Payer: BLUE CROSS/BLUE SHIELD | Admitting: Neurology

## 2016-10-25 ENCOUNTER — Other Ambulatory Visit: Payer: Self-pay | Admitting: Physician Assistant

## 2016-10-25 DIAGNOSIS — Z1231 Encounter for screening mammogram for malignant neoplasm of breast: Secondary | ICD-10-CM

## 2016-11-08 ENCOUNTER — Ambulatory Visit: Payer: BLUE CROSS/BLUE SHIELD

## 2016-11-15 ENCOUNTER — Ambulatory Visit: Payer: BLUE CROSS/BLUE SHIELD | Admitting: Podiatry

## 2017-03-04 ENCOUNTER — Other Ambulatory Visit: Payer: Self-pay | Admitting: Physician Assistant

## 2017-03-04 DIAGNOSIS — N939 Abnormal uterine and vaginal bleeding, unspecified: Secondary | ICD-10-CM

## 2017-03-05 ENCOUNTER — Ambulatory Visit
Admission: RE | Admit: 2017-03-05 | Discharge: 2017-03-05 | Disposition: A | Discharge status: Home / Self Care | Payer: BLUE CROSS/BLUE SHIELD | Source: Ambulatory Visit | Attending: Physician Assistant | Admitting: Physician Assistant

## 2017-03-05 DIAGNOSIS — N939 Abnormal uterine and vaginal bleeding, unspecified: Secondary | ICD-10-CM

## 2017-04-05 ENCOUNTER — Other Ambulatory Visit: Payer: Self-pay | Admitting: Obstetrics and Gynecology

## 2017-05-30 ENCOUNTER — Other Ambulatory Visit: Payer: Self-pay | Admitting: Physician Assistant

## 2017-05-30 DIAGNOSIS — R519 Headache, unspecified: Secondary | ICD-10-CM

## 2017-05-30 DIAGNOSIS — R51 Headache: Principal | ICD-10-CM

## 2017-06-11 ENCOUNTER — Other Ambulatory Visit: Payer: BLUE CROSS/BLUE SHIELD

## 2017-09-04 ENCOUNTER — Other Ambulatory Visit: Payer: Self-pay | Admitting: Physician Assistant

## 2017-09-04 DIAGNOSIS — Z1231 Encounter for screening mammogram for malignant neoplasm of breast: Secondary | ICD-10-CM

## 2017-09-26 ENCOUNTER — Ambulatory Visit: Payer: BLUE CROSS/BLUE SHIELD

## 2017-10-15 ENCOUNTER — Other Ambulatory Visit (HOSPITAL_COMMUNITY): Payer: Self-pay | Admitting: Physician Assistant

## 2017-10-15 DIAGNOSIS — E0590 Thyrotoxicosis, unspecified without thyrotoxic crisis or storm: Secondary | ICD-10-CM

## 2017-10-30 ENCOUNTER — Encounter (HOSPITAL_COMMUNITY)
Admission: RE | Admit: 2017-10-30 | Discharge: 2017-10-30 | Disposition: A | Discharge status: Home / Self Care | Payer: BLUE CROSS/BLUE SHIELD | Source: Ambulatory Visit | Attending: Physician Assistant | Admitting: Physician Assistant

## 2017-10-30 ENCOUNTER — Encounter (HOSPITAL_COMMUNITY): Payer: BLUE CROSS/BLUE SHIELD

## 2017-10-30 ENCOUNTER — Other Ambulatory Visit (HOSPITAL_COMMUNITY): Payer: BLUE CROSS/BLUE SHIELD

## 2017-10-30 DIAGNOSIS — E0590 Thyrotoxicosis, unspecified without thyrotoxic crisis or storm: Secondary | ICD-10-CM | POA: Diagnosis not present

## 2017-10-30 MED ORDER — SODIUM IODIDE I-123 7.4 MBQ CAPS
440.0000 | ORAL_CAPSULE | Freq: Once | ORAL | Status: AC
  Administered 2017-10-30: 440 via ORAL

## 2017-10-31 ENCOUNTER — Encounter (HOSPITAL_COMMUNITY)
Admission: RE | Admit: 2017-10-31 | Discharge: 2017-10-31 | Disposition: A | Discharge status: Home / Self Care | Payer: BLUE CROSS/BLUE SHIELD | Source: Ambulatory Visit | Attending: Physician Assistant | Admitting: Physician Assistant

## 2018-01-09 ENCOUNTER — Ambulatory Visit
Admission: RE | Admit: 2018-01-09 | Discharge: 2018-01-09 | Disposition: A | Discharge status: Home / Self Care | Payer: BLUE CROSS/BLUE SHIELD | Source: Ambulatory Visit | Attending: Physician Assistant | Admitting: Physician Assistant

## 2018-01-09 DIAGNOSIS — Z1231 Encounter for screening mammogram for malignant neoplasm of breast: Secondary | ICD-10-CM

## 2018-06-05 ENCOUNTER — Other Ambulatory Visit: Payer: Self-pay | Admitting: Physician Assistant

## 2018-06-05 ENCOUNTER — Ambulatory Visit
Admission: RE | Admit: 2018-06-05 | Discharge: 2018-06-05 | Disposition: A | Discharge status: Home / Self Care | Payer: BLUE CROSS/BLUE SHIELD | Source: Ambulatory Visit | Attending: Physician Assistant | Admitting: Physician Assistant

## 2018-06-05 DIAGNOSIS — R059 Cough, unspecified: Secondary | ICD-10-CM

## 2018-06-05 DIAGNOSIS — R05 Cough: Secondary | ICD-10-CM

## 2018-06-14 ENCOUNTER — Emergency Department (HOSPITAL_COMMUNITY)
Admission: EM | Admit: 2018-06-14 | Discharge: 2018-06-14 | Disposition: A | Discharge status: Home / Self Care | Payer: Self-pay | Attending: Emergency Medicine | Admitting: Emergency Medicine

## 2018-06-14 ENCOUNTER — Encounter (HOSPITAL_COMMUNITY): Payer: Self-pay

## 2018-06-14 ENCOUNTER — Emergency Department (HOSPITAL_COMMUNITY): Payer: Self-pay

## 2018-06-14 ENCOUNTER — Other Ambulatory Visit: Payer: Self-pay

## 2018-06-14 DIAGNOSIS — E079 Disorder of thyroid, unspecified: Secondary | ICD-10-CM | POA: Insufficient documentation

## 2018-06-14 DIAGNOSIS — Z79899 Other long term (current) drug therapy: Secondary | ICD-10-CM | POA: Insufficient documentation

## 2018-06-14 DIAGNOSIS — I10 Essential (primary) hypertension: Secondary | ICD-10-CM | POA: Insufficient documentation

## 2018-06-14 DIAGNOSIS — K209 Esophagitis, unspecified without bleeding: Secondary | ICD-10-CM

## 2018-06-14 DIAGNOSIS — R0789 Other chest pain: Secondary | ICD-10-CM | POA: Insufficient documentation

## 2018-06-14 DIAGNOSIS — R1032 Left lower quadrant pain: Secondary | ICD-10-CM | POA: Insufficient documentation

## 2018-06-14 LAB — CBC WITH DIFFERENTIAL/PLATELET
Abs Immature Granulocytes: 0.02 10*3/uL (ref 0.00–0.07)
Basophils Absolute: 0.1 10*3/uL (ref 0.0–0.1)
Basophils Relative: 1 %
Eosinophils Absolute: 0.2 10*3/uL (ref 0.0–0.5)
Eosinophils Relative: 2 %
HCT: 44 % (ref 36.0–46.0)
Hemoglobin: 14.6 g/dL (ref 12.0–15.0)
Immature Granulocytes: 0 %
Lymphocytes Relative: 31 %
Lymphs Abs: 3.1 10*3/uL (ref 0.7–4.0)
MCH: 30.3 pg (ref 26.0–34.0)
MCHC: 33.2 g/dL (ref 30.0–36.0)
MCV: 91.3 fL (ref 80.0–100.0)
Monocytes Absolute: 0.6 10*3/uL (ref 0.1–1.0)
Monocytes Relative: 6 %
Neutro Abs: 5.8 10*3/uL (ref 1.7–7.7)
Neutrophils Relative %: 60 %
Platelets: 262 10*3/uL (ref 150–400)
RBC: 4.82 MIL/uL (ref 3.87–5.11)
RDW: 12 % (ref 11.5–15.5)
WBC: 9.9 10*3/uL (ref 4.0–10.5)
nRBC: 0 % (ref 0.0–0.2)

## 2018-06-14 LAB — COMPREHENSIVE METABOLIC PANEL
ALT: 22 U/L (ref 0–44)
AST: 25 U/L (ref 15–41)
Albumin: 3.7 g/dL (ref 3.5–5.0)
Alkaline Phosphatase: 71 U/L (ref 38–126)
Anion gap: 10 (ref 5–15)
BUN: 10 mg/dL (ref 6–20)
CO2: 25 mmol/L (ref 22–32)
Calcium: 9.1 mg/dL (ref 8.9–10.3)
Chloride: 102 mmol/L (ref 98–111)
Creatinine, Ser: 0.65 mg/dL (ref 0.44–1.00)
GFR calc Af Amer: >60 mL/min (ref >60)
GFR calc non Af Amer: >60 mL/min (ref >60)
Glucose, Bld: 99 mg/dL (ref 70–99)
Potassium: 4.2 mmol/L (ref 3.5–5.1)
Sodium: 137 mmol/L (ref 135–145)
Total Bilirubin: 1.2 mg/dL (ref 0.3–1.2)
Total Protein: 7.6 g/dL (ref 6.5–8.1)

## 2018-06-14 LAB — URINALYSIS, ROUTINE W REFLEX MICROSCOPIC
Bacteria, UA: NONE SEEN
Bilirubin Urine: NEGATIVE
Glucose, UA: NEGATIVE mg/dL
Ketones, ur: NEGATIVE mg/dL
Leukocytes,Ua: NEGATIVE
Nitrite: NEGATIVE
Protein, ur: NEGATIVE mg/dL
Specific Gravity, Urine: >1.046 — ABNORMAL HIGH (ref 1.005–1.030)
pH: 6 (ref 5.0–8.0)

## 2018-06-14 LAB — D-DIMER, QUANTITATIVE: D-Dimer, Quant: 0.72 ug/mL-FEU — ABNORMAL HIGH (ref 0.00–0.50)

## 2018-06-14 LAB — I-STAT TROPONIN, ED: Troponin i, poc: 0 ng/mL (ref 0.00–0.08)

## 2018-06-14 MED ORDER — HYDROMORPHONE HCL 1 MG/ML IJ SOLN
1.0000 mg | Freq: Once | INTRAMUSCULAR | Status: AC
  Administered 2018-06-14: 1 mg via INTRAVENOUS
  Filled 2018-06-14: qty 1

## 2018-06-14 MED ORDER — TRAMADOL HCL 50 MG PO TABS: 50.0000 mg | ORAL_TABLET | Freq: Four times a day (QID) | ORAL | 0 refills | Status: DC | PRN

## 2018-06-14 MED ORDER — IOPAMIDOL (ISOVUE-370) INJECTION 76%
100.0000 mL | Freq: Once | INTRAVENOUS | Status: AC | PRN
  Administered 2018-06-14: 100 mL via INTRAVENOUS

## 2018-06-14 MED ORDER — OXYCODONE HCL 5 MG PO TABS: 5.0000 mg | ORAL_TABLET | Freq: Four times a day (QID) | ORAL | 0 refills | Status: DC | PRN

## 2018-06-14 MED ORDER — IOPAMIDOL (ISOVUE-370) INJECTION 76%
INTRAVENOUS | Status: AC
  Filled 2018-06-14: qty 100

## 2018-06-14 MED ORDER — LORAZEPAM 2 MG/ML IJ SOLN
0.5000 mg | Freq: Once | INTRAMUSCULAR | Status: AC
  Administered 2018-06-14: 0.5 mg via INTRAVENOUS
  Filled 2018-06-14: qty 1

## 2018-06-14 MED ORDER — PANTOPRAZOLE SODIUM 20 MG PO TBEC: 20.0000 mg | DELAYED_RELEASE_TABLET | Freq: Every day | ORAL | 0 refills | Status: DC

## 2018-06-14 MED ORDER — HYDROCODONE-ACETAMINOPHEN 5-325 MG PO TABS: 1.0000 | ORAL_TABLET | Freq: Four times a day (QID) | ORAL | 0 refills | Status: DC | PRN

## 2018-06-14 NOTE — Discharge Instructions (Addendum)
Follow-up with your doctor this week for recheck. 

## 2018-06-14 NOTE — ED Notes (Signed)
ED Provider at bedside. 

## 2018-06-14 NOTE — ED Triage Notes (Signed)
Pt. Stated, left side pain going to stomach. Started 3 weeks ago. Seen a Dr, a week ago and dx as muscle pain.

## 2018-06-14 NOTE — ED Notes (Signed)
Patient verbalizes understanding of discharge instructions. Opportunity for questioning and answers were provided. Armband removed by staff, pt discharged from ED ambulatory.   

## 2018-06-14 NOTE — ED Provider Notes (Signed)
[facility] EMERGENCY DEPARTMENT Provider Note   CSN: [id number] Arrival date & time: 06/14/18  3329    History   Chief Complaint Chief Complaint  Patient presents with  . Generalized Body Aches    HPI Donna Norton is a 53 y.o. female.     Patient complains of chest pain worse with inspiration also some left flank pain  The history is provided by the patient. No language interpreter was used.  Chest Pain  Pain location:  L chest Pain quality: aching   Pain radiates to:  Does not radiate Pain severity:  Moderate Onset quality:  Sudden Timing:  Constant Progression:  Worsening Chronicity:  New Context: breathing   Relieved by:  Nothing Worsened by:  Nothing Ineffective treatments:  None tried Associated symptoms: back pain   Associated symptoms: no abdominal pain, no cough, no fatigue and no headache     Past Medical History:  Diagnosis Date  . Arthritis   . Chronic cough   . Chronic headache   . Fibromyalgia   . Generalized weakness   . Meningitis   . Paresthesias    chronic  . PNA (pneumonia)    "a year ago"  . Thyroid disease     Patient Active Problem List   Diagnosis Date Noted  . Essential hypertension, benign 05/31/2013  . Cough 05/13/2013    Past Surgical History:  Procedure Laterality Date  . None       OB History   No obstetric history on file.      Home Medications    Prior to Admission medications   Medication Sig Start Date End Date Taking? Authorizing Provider  b complex vitamins tablet Take 1 tablet by mouth daily.   Yes [provider]  cyclobenzaprine (FLEXERIL) 10 MG tablet Take 10 mg by mouth 3 (three) times daily as needed for muscle spasms. 06/05/18  Yes [provider]  diclofenac (VOLTAREN) 50 MG EC tablet Take 50 mg by mouth 2 (two) times daily with a meal. 07/13/16  Yes [provider]  ibuprofen (ADVIL,MOTRIN) 200 MG tablet Take 400 mg by mouth every 6 (six) hours as  needed for moderate pain.   Yes [provider]  Multiple Vitamin (MULTIVITAMIN WITH MINERALS) TABS tablet Take 0.5 tablets by mouth daily.    Yes [provider]  vitamin C (ASCORBIC ACID) 500 MG tablet Take 500 mg by mouth daily.   Yes [provider]  HYDROcodone-acetaminophen (NORCO/VICODIN) 5-325 MG tablet Take 1 tablet by mouth every 6 (six) hours as needed for moderate pain. 06/14/18   Milton Ferguson, MD  pantoprazole (PROTONIX) 20 MG tablet Take 1 tablet (20 mg total) by mouth daily. 06/14/18   Milton Ferguson, MD    Family History Family History  Problem Relation Age of Onset  . Hypertension Mother   . Heart disease Father        5 stents  . Allergies Child   . Asthma Grandchild   . Cancer - Cervical Sister     Social History Social History   Tobacco Use  . Smoking status: Never Smoker  . Smokeless tobacco: Never Used  Substance Use Topics  . Alcohol use: Yes    Comment: occasional sangria - rare  . Drug use: No     Allergies   Ceclor [cefaclor]; Methimazole [methimazole]; and Percocet [oxycodone-acetaminophen]   Review of Systems Review of Systems  Constitutional: Negative for appetite change and fatigue.  HENT: Negative for congestion, ear  discharge and sinus pressure.   Eyes: Negative for discharge.  Respiratory: Negative for cough.   Cardiovascular: Positive for chest pain.  Gastrointestinal: Negative for abdominal pain and diarrhea.  Genitourinary: Negative for frequency and hematuria.  Musculoskeletal: Positive for back pain.  Skin: Negative for rash.  Neurological: Negative for seizures and headaches.  Psychiatric/Behavioral: Negative for hallucinations.     Physical Exam Updated Vital Signs BP 111/72   Pulse 85   Temp 97.8 F (36.6 C) (Oral)   Resp 18   Ht 5\' 2"  (1.575 m)   Wt 91.3 kg   SpO2 92%   BMI 36.81 kg/m   Physical Exam Constitutional:      Appearance: She is well-developed.  HENT:     Head:  Normocephalic.  Eyes:     General: No scleral icterus.    Conjunctiva/sclera: Conjunctivae normal.  Neck:     Musculoskeletal: Neck supple.     Thyroid: No thyromegaly.  Cardiovascular:     Rate and Rhythm: Normal rate and regular rhythm.     Heart sounds: No murmur. No friction rub. No gallop.   Pulmonary:     Breath sounds: No stridor. No wheezing or rales.  Chest:     Chest wall: No tenderness.  Abdominal:     General: There is no distension.     Tenderness: There is no abdominal tenderness. There is no rebound.  Musculoskeletal: Normal range of motion.  Lymphadenopathy:     Cervical: No cervical adenopathy.  Skin:    Findings: No erythema or rash.  Neurological:     Mental Status: She is oriented to person, place, and time.     Motor: No abnormal muscle tone.     Coordination: Coordination normal.  Psychiatric:        Behavior: Behavior normal.      ED Treatments / Results  Labs (all labs ordered are listed, but only abnormal results are displayed) Labs Reviewed  D-DIMER, QUANTITATIVE (NOT AT [facility]) - Abnormal; Notable for the following components:      Result Value   D-Dimer, Quant 0.72 (*)    All other components within normal limits  URINALYSIS, ROUTINE W REFLEX MICROSCOPIC - Abnormal; Notable for the following components:   Specific Gravity, Urine >1.046 (*)    Hgb urine dipstick MODERATE (*)    All other components within normal limits  CBC WITH DIFFERENTIAL/PLATELET  COMPREHENSIVE METABOLIC PANEL  I-STAT TROPONIN, ED    EKG None  Radiology Dg Chest 2 View  Result Date: 06/14/2018 CLINICAL DATA:  Cough and shortness of breath. EXAM: CHEST - 2 VIEW COMPARISON:  Chest x-ray dated June 05, 2018. FINDINGS: The heart size and mediastinal contours are within normal limits. Both lungs are clear. The visualized skeletal structures are unremarkable. IMPRESSION: No active cardiopulmonary disease. Electronically Signed   By: Titus Dubin M.D.   On:  06/14/2018 10:05   Ct Angio Chest Pe W And/or Wo Contrast  Result Date: 06/14/2018 CLINICAL DATA:  53 year old with cough and chest tightness. Pulmonary embolism is suspected. EXAM: CT ANGIOGRAPHY CHEST WITH CONTRAST TECHNIQUE: Multidetector CT imaging of the chest was performed using the standard protocol during bolus administration of intravenous contrast. Multiplanar CT image reconstructions and MIPs were obtained to evaluate the vascular anatomy. CONTRAST:  168mL ISOVUE-370 IOPAMIDOL (ISOVUE-370) INJECTION 76% COMPARISON:  Chest CT 04/03/2013 FINDINGS: Cardiovascular: Satisfactory opacification of the pulmonary arteries to the segmental level. No evidence of pulmonary embolism. Normal heart size. No pericardial effusion. Mediastinum/Nodes: Small lymph nodes  in the mediastinum. No significant lymph node enlargement in the mediastinum or hilar regions. Mild wall thickening in the distal esophagus and evidence for a small hiatal hernia. Lungs/Pleura: Again noted is a tubular structure in the anterior aspect of the right lower lobe which was present on the previous examination. Findings are suggestive for chronic mucoid impaction. Dependent atelectasis in both lungs. No significant pleural effusions. No new areas of airspace disease or consolidation. Upper Abdomen: Again noted are hypodensities in the liver that likely represent hepatic cysts. No acute abnormality. Musculoskeletal: No acute bone abnormality. Review of the MIP images confirms the above findings. IMPRESSION: 1. Negative for pulmonary embolism. 2. Mild wall thickening in the distal esophagus with a small hiatal hernia. Underlying esophagitis cannot be excluded. 3. Stable tubular density in the anterior right lower lobe. Findings are suggestive for chronic mucoid impaction. 4. Hepatic cysts. Electronically Signed   By: Markus Daft M.D.   On: 06/14/2018 11:31    Procedures Procedures (including critical care time)  Medications Ordered in  ED Medications  iopamidol (ISOVUE-370) 76 % injection (has no administration in time range)  HYDROmorphone (DILAUDID) injection 1 mg (1 mg Intravenous Given 06/14/18 0913)  LORazepam (ATIVAN) injection 0.5 mg (0.5 mg Intravenous Given 06/14/18 0912)  iopamidol (ISOVUE-370) 76 % injection 100 mL (100 mLs Intravenous Contrast Given 06/14/18 1103)  HYDROmorphone (DILAUDID) injection 1 mg (1 mg Intravenous Given 06/14/18 1139)     Initial Impression / Assessment and Plan / ED Course  I have reviewed the triage vital signs and the nursing notes.  Pertinent labs & imaging results that were available during my care of the patient were reviewed by me and considered in my medical decision making (see chart for details).   Labs unremarkable including troponin.  CT scan of the chest shows maybe some esophagitis.  Patient will be discharged home with pain medicine and protonic.  Patient will follow-up with her PCP this week       Final Clinical Impressions(s) / ED Diagnoses   Final diagnoses:  Esophagitis    ED Discharge Orders         Ordered    pantoprazole (PROTONIX) 20 MG tablet  Daily     06/14/18 1405    HYDROcodone-acetaminophen (NORCO/VICODIN) 5-325 MG tablet  Every 6 hours PRN     06/14/18 1405           Milton Ferguson, MD 06/14/18 1412

## 2018-06-14 NOTE — ED Notes (Signed)
Patient transported to CT 

## 2018-06-14 NOTE — ED Triage Notes (Signed)
Pt arrives to ED with complaints of cough, chest tightness, and body aches for 3 weeks. Pt states she was given muscle relaxers and anti-inflammatory meds about a week ago and she states they have not helped much. Pt states she has not had the strength to work recently.

## 2019-02-24 ENCOUNTER — Other Ambulatory Visit: Payer: Self-pay

## 2019-02-24 DIAGNOSIS — Z20822 Contact with and (suspected) exposure to covid-19: Secondary | ICD-10-CM

## 2019-02-25 LAB — NOVEL CORONAVIRUS, NAA: SARS-CoV-2, NAA: NOT DETECTED

## 2019-03-17 ENCOUNTER — Other Ambulatory Visit: Payer: Self-pay

## 2019-03-17 DIAGNOSIS — Z20822 Contact with and (suspected) exposure to covid-19: Secondary | ICD-10-CM

## 2019-03-19 LAB — NOVEL CORONAVIRUS, NAA: SARS-CoV-2, NAA: NOT DETECTED

## 2019-03-24 DIAGNOSIS — J189 Pneumonia, unspecified organism: Secondary | ICD-10-CM

## 2019-03-24 HISTORY — DX: Pneumonia, unspecified organism: J18.9

## 2019-04-20 ENCOUNTER — Ambulatory Visit: Payer: Self-pay | Attending: Internal Medicine

## 2019-04-20 DIAGNOSIS — Z20822 Contact with and (suspected) exposure to covid-19: Secondary | ICD-10-CM

## 2019-04-20 DIAGNOSIS — Z20828 Contact with and (suspected) exposure to other viral communicable diseases: Secondary | ICD-10-CM | POA: Insufficient documentation

## 2019-04-22 ENCOUNTER — Ambulatory Visit: Payer: Medicaid Other | Attending: Internal Medicine

## 2019-04-22 DIAGNOSIS — Z20822 Contact with and (suspected) exposure to covid-19: Secondary | ICD-10-CM

## 2019-04-22 LAB — NOVEL CORONAVIRUS, NAA

## 2019-04-23 LAB — NOVEL CORONAVIRUS, NAA: SARS-CoV-2, NAA: DETECTED — AB

## 2019-04-24 ENCOUNTER — Telehealth: Payer: Self-pay | Admitting: Nurse Practitioner

## 2019-04-24 NOTE — Telephone Encounter (Signed)
Called to Discuss with patient about Covid symptoms and the use of bamlanivimab, a monoclonal antibody infusion for those with mild to moderate Covid symptoms and at a high risk of hospitalization.     Pt is qualified for this infusion at the [facility] infusion center due to co-morbid conditions and/or a member of an at-risk group.     Patient Active Problem List   Diagnosis Date Noted  . Essential hypertension, benign 05/31/2013  . Cough 05/13/2013    Patient declines infusion at this time. Symptoms tier reviewed as well as criteria for ending isolation. Preventative practices reviewed. Patient verbalized understanding.    Patient advised to go to Urgent care or ED with severe symptoms.

## 2019-04-26 ENCOUNTER — Telehealth: Payer: Self-pay | Admitting: Unknown Physician Specialty

## 2019-04-26 NOTE — Telephone Encounter (Signed)
Called to discuss with patient about Covid symptoms and the use of bamlanivimab, a monoclonal antibody infusion for those with mild to moderate Covid symptoms and at a high risk of hospitalization.  Pt is qualified for this infusion at the [facility] infusion center due to Age >55 with hypertension.    However day 9 of symptoms on Monday.  No openings available but on wait-list.

## 2019-05-01 ENCOUNTER — Other Ambulatory Visit: Payer: Self-pay

## 2019-05-01 ENCOUNTER — Other Ambulatory Visit: Payer: Medicaid Other

## 2019-05-01 ENCOUNTER — Emergency Department (HOSPITAL_COMMUNITY)
Admission: EM | Admit: 2019-05-01 | Discharge: 2019-05-01 | Disposition: A | Discharge status: Home / Self Care | Payer: Self-pay | Attending: Emergency Medicine | Admitting: Emergency Medicine

## 2019-05-01 ENCOUNTER — Ambulatory Visit: Payer: Self-pay

## 2019-05-01 ENCOUNTER — Emergency Department (HOSPITAL_COMMUNITY): Payer: Self-pay

## 2019-05-01 DIAGNOSIS — R109 Unspecified abdominal pain: Secondary | ICD-10-CM | POA: Insufficient documentation

## 2019-05-01 DIAGNOSIS — R519 Headache, unspecified: Secondary | ICD-10-CM

## 2019-05-01 DIAGNOSIS — I10 Essential (primary) hypertension: Secondary | ICD-10-CM | POA: Insufficient documentation

## 2019-05-01 DIAGNOSIS — Z79899 Other long term (current) drug therapy: Secondary | ICD-10-CM | POA: Insufficient documentation

## 2019-05-01 DIAGNOSIS — U071 COVID-19: Secondary | ICD-10-CM | POA: Insufficient documentation

## 2019-05-01 LAB — CBC WITH DIFFERENTIAL/PLATELET
Abs Immature Granulocytes: 0.02 10*3/uL (ref 0.00–0.07)
Basophils Absolute: 0 10*3/uL (ref 0.0–0.1)
Basophils Relative: 0 %
Eosinophils Absolute: 0.1 10*3/uL (ref 0.0–0.5)
Eosinophils Relative: 2 %
HCT: 43.9 % (ref 36.0–46.0)
Hemoglobin: 14.9 g/dL (ref 12.0–15.0)
Immature Granulocytes: 0 %
Lymphocytes Relative: 43 %
Lymphs Abs: 3.1 10*3/uL (ref 0.7–4.0)
MCH: 31 pg (ref 26.0–34.0)
MCHC: 33.9 g/dL (ref 30.0–36.0)
MCV: 91.5 fL (ref 80.0–100.0)
Monocytes Absolute: 0.6 10*3/uL (ref 0.1–1.0)
Monocytes Relative: 8 %
Neutro Abs: 3.3 10*3/uL (ref 1.7–7.7)
Neutrophils Relative %: 47 %
Platelets: 263 10*3/uL (ref 150–400)
RBC: 4.8 MIL/uL (ref 3.87–5.11)
RDW: 12.3 % (ref 11.5–15.5)
WBC: 7.1 10*3/uL (ref 4.0–10.5)
nRBC: 0 % (ref 0.0–0.2)

## 2019-05-01 LAB — BASIC METABOLIC PANEL
Anion gap: 10 (ref 5–15)
BUN: 14 mg/dL (ref 6–20)
CO2: 26 mmol/L (ref 22–32)
Calcium: 9.1 mg/dL (ref 8.9–10.3)
Chloride: 104 mmol/L (ref 98–111)
Creatinine, Ser: 0.57 mg/dL (ref 0.44–1.00)
GFR calc Af Amer: >60 mL/min (ref >60)
GFR calc non Af Amer: >60 mL/min (ref >60)
Glucose, Bld: 96 mg/dL (ref 70–99)
Potassium: 3.7 mmol/L (ref 3.5–5.1)
Sodium: 140 mmol/L (ref 135–145)

## 2019-05-01 MED ORDER — HYDROCHLOROTHIAZIDE 25 MG PO TABS: 25.0000 mg | ORAL_TABLET | Freq: Every day | ORAL | 0 refills | Status: DC

## 2019-05-01 MED ORDER — METOCLOPRAMIDE HCL 5 MG/ML IJ SOLN
5.0000 mg | Freq: Once | INTRAMUSCULAR | Status: AC
  Administered 2019-05-01: 5 mg via INTRAVENOUS
  Filled 2019-05-01: qty 2

## 2019-05-01 MED ORDER — DIPHENHYDRAMINE HCL 50 MG/ML IJ SOLN
12.5000 mg | Freq: Once | INTRAMUSCULAR | Status: AC
  Administered 2019-05-01: 12:00:00 12.5 mg via INTRAVENOUS
  Filled 2019-05-01: qty 1

## 2019-05-01 MED ORDER — KETOROLAC TROMETHAMINE 15 MG/ML IJ SOLN
15.0000 mg | Freq: Once | INTRAMUSCULAR | Status: AC
  Administered 2019-05-01: 15 mg via INTRAVENOUS
  Filled 2019-05-01: qty 1

## 2019-05-01 MED ORDER — SODIUM CHLORIDE 0.9 % IV BOLUS
1000.0000 mL | Freq: Once | INTRAVENOUS | Status: AC
  Administered 2019-05-01: 11:00:00 1000 mL via INTRAVENOUS

## 2019-05-01 NOTE — ED Triage Notes (Signed)
Pt reports was Covid+ 14 days ago and was supposed to get re-tested today but couldn't go. Reports for past 2 days had headache, BP been elevated and having left side pains. Reports has been off HTN meds for several years.

## 2019-05-01 NOTE — ED Provider Notes (Signed)
[facility] DEPT Provider Note   CSN: [id number] Arrival date & time: 05/01/19  1012     History Chief Complaint  Patient presents with  . Headache  . Hypertension  . left side pain    Donna Norton is a 54 y.o. female.  HPI   54 year old female with numerous complaints.  She is concerned about her blood pressure.  She has noticed that has been elevated for the past several days.  She reports systolic blood pressures of 1 [id number] 50 and diastolic blood pressures of 90-100.  She reports prior diagnosis of hypertension and not previously on medication but has not taken them in quite some time.  She has felt fatigued recently.  She did recently test positive for Covid approximately week and a half ago.  This PT has been ongoing.  She also reports left side/flank pain for the past several weeks to months.  No urinary complaints.  Occasional nonproductive cough.  No recent vomiting or diarrhea.  Also headache which is common gone for the past 2 days.  No acute visual changes or neurologic complaints otherwise.  No photophobia.  Past Medical History:  Diagnosis Date  . Arthritis   . Chronic cough   . Chronic headache   . Fibromyalgia   . Generalized weakness   . Meningitis   . Paresthesias    chronic  . PNA (pneumonia)    "a year ago"  . Thyroid disease     Patient Active Problem List   Diagnosis Date Noted  . Essential hypertension, benign 05/31/2013  . Cough 05/13/2013    Past Surgical History:  Procedure Laterality Date  . None       OB History   No obstetric history on file.     Family History  Problem Relation Age of Onset  . Hypertension Mother   . Heart disease Father        5 stents  . Allergies Child   . Asthma Grandchild   . Cancer - Cervical Sister     Social History   Tobacco Use  . Smoking status: Never Smoker  . Smokeless tobacco: Never Used  Substance Use Topics  . Alcohol use: Yes    Comment: occasional  sangria - rare  . Drug use: No    Home Medications Prior to Admission medications   Medication Sig Start Date End Date Taking? Authorizing Provider  b complex vitamins tablet Take 1 tablet by mouth daily.    [provider]  cyclobenzaprine (FLEXERIL) 10 MG tablet Take 10 mg by mouth 3 (three) times daily as needed for muscle spasms. 06/05/18   [provider]  diclofenac (VOLTAREN) 50 MG EC tablet Take 50 mg by mouth 2 (two) times daily with a meal. 07/13/16   [provider]  HYDROcodone-acetaminophen (NORCO/VICODIN) 5-325 MG tablet Take 1 tablet by mouth every 6 (six) hours as needed for moderate pain. 06/14/18   Milton Ferguson, MD  ibuprofen (ADVIL,MOTRIN) 200 MG tablet Take 400 mg by mouth every 6 (six) hours as needed for moderate pain.    [provider]  Multiple Vitamin (MULTIVITAMIN WITH MINERALS) TABS tablet Take 0.5 tablets by mouth daily.     [provider]  oxyCODONE (ROXICODONE) 5 MG immediate release tablet Take 1 tablet (5 mg total) by mouth every 6 (six) hours as needed for severe pain. 06/14/18   Milton Ferguson, MD  pantoprazole (PROTONIX) 20 MG tablet Take 1 tablet (20 mg total) by mouth  daily. 06/14/18   Milton Ferguson, MD  traMADol (ULTRAM) 50 MG tablet Take 1 tablet (50 mg total) by mouth every 6 (six) hours as needed. 06/14/18   Milton Ferguson, MD  vitamin C (ASCORBIC ACID) 500 MG tablet Take 500 mg by mouth daily.    [provider]    Allergies    Ceclor [cefaclor], Methimazole [methimazole], and Percocet [oxycodone-acetaminophen]  Review of Systems   Review of Systems All systems reviewed and negative, other than as noted in HPI.  Physical Exam Updated Vital Signs BP (!) 150/84   Pulse (!) 59   Temp 98.8 F (37.1 C) (Oral)   Resp 15   SpO2 100%   Physical Exam Vitals and nursing note reviewed.  Constitutional:      General: She is not in acute distress.    Appearance: She is well-developed.  HENT:      Head: Normocephalic and atraumatic.  Eyes:     General:        Right eye: No discharge.        Left eye: No discharge.     Conjunctiva/sclera: Conjunctivae normal.  Neck:     Comments: No nuchal rigidity. Cardiovascular:     Rate and Rhythm: Normal rate and regular rhythm.     Heart sounds: Normal heart sounds. No murmur. No friction rub. No gallop.   Pulmonary:     Effort: Pulmonary effort is normal. No respiratory distress.     Breath sounds: Normal breath sounds.  Abdominal:     General: There is no distension.     Palpations: Abdomen is soft.     Tenderness: There is no abdominal tenderness.  Musculoskeletal:        General: No tenderness.     Cervical back: Neck supple.  Skin:    General: Skin is warm and dry.  Neurological:     Mental Status: She is alert and oriented to person, place, and time. Mental status is at baseline.     Cranial Nerves: No cranial nerve deficit.     Sensory: No sensory deficit.     Motor: No weakness.     Comments: Speech clear.  Content appropriate.  Cranial nerves II through XII intact.  Strength normal in all extremities.  Steady gait.  Psychiatric:        Behavior: Behavior normal.        Thought Content: Thought content normal.     ED Results / Procedures / Treatments   Labs (all labs ordered are listed, but only abnormal results are displayed) Labs Reviewed  CBC WITH DIFFERENTIAL/PLATELET  BASIC METABOLIC PANEL   EKG None  Radiology DG Abdomen 1 View  Result Date: 05/01/2019 CLINICAL DATA:  LEFT side flank pain for several months EXAM: ABDOMEN - 1 VIEW COMPARISON:  None FINDINGS: Lung bases clear. Nonobstructive bowel gas pattern. Scattered stool and gas throughout colon. No bowel dilatation or bowel wall thickening. Small pelvic phleboliths. No urinary tract calcification or acute osseous findings. IMPRESSION: Normal exam. Electronically Signed   By: Lavonia Dana M.D.   On: 05/01/2019 12:44   Procedures Procedures (including  critical care time)  Medications Ordered in ED Medications  sodium chloride 0.9 % bolus 1,000 mL (1,000 mLs Intravenous New Bag/Given 05/01/19 1126)  ketorolac (TORADOL) 15 MG/ML injection 15 mg (15 mg Intravenous Given 05/01/19 1131)  metoCLOPramide (REGLAN) injection 5 mg (5 mg Intravenous Given 05/01/19 1131)  diphenhydrAMINE (BENADRYL) injection 12.5 mg (12.5 mg Intravenous Given 05/01/19 1131)  ED Course  I have reviewed the triage vital signs and the nursing notes.  Pertinent labs & imaging results that were available during my care of the patient were reviewed by me and considered in my medical decision making (see chart for details).    MDM Rules/Calculators/A&P  54 year old female with multiple complaints.  Think there is a significant anxiety component.  Regardless, her ED work-up is fairly unremarkable.  Her exam is reassuring.  No signs of meningitis.  Neuro exam is nonfocal.  Doubt meningitis, bleed, carbon monoxide poisoning, acute glaucoma, etc.  She reports consistently high blood pressures over the past several days and she is again hypertensive here in the emergency room.  This is relatively mild hypertension and I doubt that it is significantly attributing to her symptoms.  Discussed starting on her low-dose antihypertensive versus continue to keep a log and follow-up with her PCP.  She feels more comfortable starting medication.  Prescription provided.  Advised that she still needs to follow-up with her PCP.  Emergent return precautions were discussed.  Outpatient follow-up otherwise.  Final Clinical Impression(s) / ED Diagnoses Final diagnoses:  Nonintractable headache, unspecified chronicity pattern, unspecified headache type  Hypertension, unspecified type  Left flank pain    Rx / DC Orders ED Discharge Orders    None       Virgel Manifold, MD 05/01/19 1306

## 2019-05-01 NOTE — Discharge Instructions (Addendum)
Your chest x-ray does not show any acute abnormalities.  Her labs are normal.  Her blood pressure is mildly elevated.  I am going to start you on a low-dose of the medication.  I would like you to continue to keep a log of your blood pressures at home.  You need to follow-up with your family doctor and please take this log with you to your appointment.

## 2019-05-01 NOTE — Telephone Encounter (Signed)
Returned call to patient who states she is recovering from COVID-19 14th day.  She states she feels bad and her BP is elevated  Today 143/104  Last night 129/94  HR has been from 50-70. O2 sat 94/95%. She states she has headache that she feels is due to her BP elevation. She states that she has no numbness but her left arm is weak.  She states she has just walked across the room and feel unsteady. Per protocol patient will go to ER for evaluation.  [facility] was notified for patient coming in COVID-+. Patient will have son transport her to the ER. She verbalized understanding of all care advice.  Reason for Disposition . [id number] Systolic BP  >= 0000000 OR Diastolic >= [id number] AND [id number] cardiac or neurologic symptoms (e.g., chest pain, difficulty breathing, unsteady gait, blurred vision)  Answer Assessment - Initial Assessment Questions 1. BLOOD PRESSURE: "What is the blood pressure?" "Did you take at least two measurements 5 minutes apart?"     129/94 Hr 50-70, 143/104 2. ONSET: "When did you take your blood pressure?"    Last night and now 3. HOW: "How did you obtain the blood pressure?" (e.g., visiting nurse, automatic home BP monitor)   Home monitor 4. HISTORY: "Do you have a history of high blood pressure?"     In past  5. MEDICATIONS: "Are you taking any medications for blood pressure?" "Have you missed any doses recently?"     No  6. OTHER SYMPTOMS: "Do you have any symptoms?" (e.g., headache, chest pain, blurred vision, difficulty breathing, weakness)    Headache, rt arm weak 7. PREGNANCY: "Is there any chance you are pregnant?" "When was your last menstrual period?"    No endometriosis  Protocols used: HIGH BLOOD PRESSURE-A-AH

## 2019-05-01 NOTE — ED Notes (Signed)
Per [facility], states 14 days Covid pos-states BP issues 143/101-headache and left arm numbness-coming in for evaluation

## 2019-05-07 ENCOUNTER — Ambulatory Visit: Payer: Medicaid Other | Attending: Internal Medicine

## 2019-05-07 DIAGNOSIS — Z20822 Contact with and (suspected) exposure to covid-19: Secondary | ICD-10-CM

## 2019-05-08 LAB — NOVEL CORONAVIRUS, NAA: SARS-CoV-2, NAA: NOT DETECTED

## 2019-09-15 ENCOUNTER — Other Ambulatory Visit: Payer: Self-pay | Admitting: Physician Assistant

## 2019-09-15 DIAGNOSIS — Z1231 Encounter for screening mammogram for malignant neoplasm of breast: Secondary | ICD-10-CM

## 2019-09-24 ENCOUNTER — Other Ambulatory Visit: Payer: Self-pay | Admitting: Obstetrics and Gynecology

## 2019-09-24 DIAGNOSIS — N950 Postmenopausal bleeding: Secondary | ICD-10-CM

## 2019-10-02 ENCOUNTER — Other Ambulatory Visit: Payer: Self-pay

## 2019-10-02 ENCOUNTER — Ambulatory Visit
Admission: RE | Admit: 2019-10-02 | Discharge: 2019-10-02 | Disposition: A | Discharge status: Home / Self Care | Payer: 59 | Source: Ambulatory Visit | Attending: Physician Assistant | Admitting: Physician Assistant

## 2019-10-02 DIAGNOSIS — Z1231 Encounter for screening mammogram for malignant neoplasm of breast: Secondary | ICD-10-CM

## 2019-10-06 ENCOUNTER — Ambulatory Visit
Admission: RE | Admit: 2019-10-06 | Discharge: 2019-10-06 | Disposition: A | Discharge status: Home / Self Care | Payer: Medicaid Other | Source: Ambulatory Visit | Attending: Obstetrics and Gynecology | Admitting: Obstetrics and Gynecology

## 2019-10-06 ENCOUNTER — Encounter (HOSPITAL_BASED_OUTPATIENT_CLINIC_OR_DEPARTMENT_OTHER): Payer: Self-pay | Admitting: Obstetrics and Gynecology

## 2019-10-06 ENCOUNTER — Other Ambulatory Visit: Payer: Self-pay

## 2019-10-06 DIAGNOSIS — N950 Postmenopausal bleeding: Secondary | ICD-10-CM

## 2019-10-10 ENCOUNTER — Other Ambulatory Visit (HOSPITAL_COMMUNITY)
Admission: RE | Admit: 2019-10-10 | Discharge: 2019-10-10 | Disposition: A | Discharge status: Home / Self Care | Payer: 59 | Source: Ambulatory Visit | Attending: Obstetrics and Gynecology | Admitting: Obstetrics and Gynecology

## 2019-10-10 DIAGNOSIS — Z01812 Encounter for preprocedural laboratory examination: Secondary | ICD-10-CM | POA: Insufficient documentation

## 2019-10-10 DIAGNOSIS — Z20822 Contact with and (suspected) exposure to covid-19: Secondary | ICD-10-CM | POA: Insufficient documentation

## 2019-10-10 LAB — SARS CORONAVIRUS 2 (TAT 6-24 HRS): SARS Coronavirus 2: NEGATIVE

## 2019-10-13 ENCOUNTER — Other Ambulatory Visit: Payer: Self-pay | Admitting: Obstetrics and Gynecology

## 2019-10-13 NOTE — H&P (Signed)
Subjective:    Chief Complaint(s):      postmenopausal bleeding       HPI:          Isolation Precautions          Respiratory Illness Screening  1. Is fever present / reported?  No,  2. Are respiratory illness symptom(s) present / reported?  No,  3. Are other symptom(s) present / reported?  No,  5. Has there been reported travel to a High Risk respiratory illness region?  Unknown,  6. Has close* contact with person(s) known to have communicable illness been reported?  No. Has patient been tested for COVID-19?  04-22-2019-negative.  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         General          54 yo presents for 2 wk f/u to discuss U/S results for evaluation of PMB.            EMB performed in 2018 was benign.            Pt contacted office on Sep 08, 2019 reporting heavy bleeding for 2 wks w/ clots and associated severe cramping. She endorsed dizziness and a lack of appetite from significant blood loss.            Pt last seen September 24, 2019 c/o heavy bleeding for 3 months w/ thick clots and sharp lower abdominal pain. At most severe bleeding, she changed large pad every 30 minutes. She reported tightness and pressure. No blood on exam. TSH, FSH and LH, and HGB all WNL. EMB unsuccessful, therefore hysteroscopy/D&C scheduled for October 14, 2019.            U/S performed by [facility] on October 06, 2019 revealed uterus measuring 9.2 x 6.0 x 6.4 cm. Multiple fibroids were noted with the largest fundal measuring 4.2 cm (previously 4.8 cm). LT posterior fibroid measured 2.6 cm (previously 2.8 cm). Endometrium thickened at 15 mm. Bilat OV WNL.            Today, pt reports she is still experiencing spotting, with her most recent being yesterday June 16. She is not bleeding in office today.            Pelvic exam revealed no blood in vaginal vault. Overall normal.      Current Medication:      Taking   diazePAM 5 MG Tablet 1/2 tablet at bedtime for anxiety/rest.      Vitamin D  50 MCG (2000 UT) Tablet 3 tablets Orally Once a day.      Multivitamin Adult - Tablet as directed Orally.         Not-Taking   Diclofenac Sodium 50 MG Tablet Delayed Release 1 tablet Orally twice a day with food.      DULoxetine HCl 20 MG Capsule Delayed Release Particles 1 capsule Orally once a day.      Levothyroxine Sodium 50 MCG Tablet 1 tablet in the morning on an empty stomach Orally Once a day.      Fish Oil 1000 MG Capsule 1 capsule Orally Once a day.      Elderberry 575 MG/5ML Syrup as directed Orally.      Advil Dual Action(Ibuprofen-Acetaminophen) 125-250 MG Tablet 2 tablets as needed Orally every 8 hrs.      valACYclovir HCl 500 MG Tablet 1 tablets Orally every 12 hrs.      Medication List reviewed and reconciled with the patient.  Medical History:   Hyperthyroidism      Osteoarthritis      Reflex Sympathatic Dystrophy      Dysthymia      Fibromyalgia      Kidney stones      Hypertension - Past H      Depression       Allergies/Intolerance:      Methimazole: Side Effects - elevated liver enz      Hycodan - headaches/ n/v      Atorvastatin Calcium - shoulder pain      Trazodone HCl: Side Effects - dizziness      Duloxetine: Side Effects - pulling her down" and decreased appetitie    Gyn History:   Sexual activity not currently sexually active.   Denies Periods : bleeding since last time she was here, continously, very heavy.   LMP 10/2010.   Denies Birth control.   Last pap smear date 09/10/16-neg.   Last mammogram date 10/02/2019-negative.   Denies Abnormal pap smear.   STD Herpes.        OB History:   Number of pregnancies  3.   Pregnancy # 1  live birth, vaginal delivery, girl.   Pregnancy # 2  live birth, vaginal delivery, boy.   Pregnancy # 3  live birth, vaginal delivery, girl.        Surgical History:   None       Hospitalization:   Childbirth x 3      Meningitis       Family History:   Father: alive, diagnosed with Diabetes,  Coronary artery disease    Mother: alive, diagnosed with Hypertension    Brother 1: alive    Brother2: alive    Brother 73: alive    Sister 1: alive, fibromyalgia, Crohns disease    Sister 2: deceased 44 yrs, cervical cancer    3 brother(s) , 2 sister(s) . 1 son(s) , 2 daughter(s) .          No family hx of colon cancer/polyps or liver disease.     Social History:       General         Tobacco use cigarettes:  Never smoked, Tobacco history last updated  10/08/2019.           no Alcohol, Rare.           Caffeine: 1 per day.           no Recreational drug use.           Exercise: twice a week (walks, exercise videos).           DENTAL CARE: lacking.           Marital Status: single, Divorced.           Children: 3.           OCCUPATION: [employer].           COMMUNICATION BARRIERS: none.      ROS:       CONSTITUTIONAL         Chills  No.  Fatigue  No.  Fever  No.  Night sweats  No.  Recent travel outside [place]  No.  Sweats  No.  Weight change  No.         OPHTHALMOLOGY         Blurring of vision  no.  Change in vision  no.  Double vision  no.  ENT         Dizziness  no.  Nose bleeds  no.  Sore throat  no.  Teeth pain  no.         ALLERGY         Hives  no.         CARDIOLOGY         Chest pain  no.  High blood pressure  no.  Irregular heart beat  no.  Leg edema  no.  Palpitations  no.         RESPIRATORY         Shortness of breath  no.  Cough  no.  Wheezing  no.         UROLOGY         Pain with urination  no.  Urinary urgency  no.  Urinary frequency  no.  Urinary incontinence  no.  Difficulty urinating  No.  Blood in urine  No.         GASTROENTEROLOGY         Abdominal pain  no.  Appetite change  no.  Bloating/belching  no.  Blood in stool or on toilet paper  no.  Change in bowel movements  no.  Constipation  no.  Diarrhea  no.  Difficulty swallowing  no.  Nausea  no.         FEMALE REPRODUCTIVE         Vulvar pain  no.  Vulvar rash  no.  Abnormal  vaginal bleeding  yes.  Breast pain  no.  Nipple discharge  no.  Pain with intercourse  no.  Pelvic pain  no.  Unusual vaginal discharge  no.  Vaginal itching  no.         MUSCULOSKELETAL         Muscle aches  no.         NEUROLOGY         Headache  no.  Tingling/numbness  no.  Weakness  no.         PSYCHOLOGY         Depression  no.  Anxiety  no.  Nervousness  no.  Sleep disturbances  no.  Suicidal ideation  no .         ENDOCRINOLOGY         Excessive thirst  no.  Excessive urination  no.  Hair loss  no.  Heat or cold intolerance  no.         HEMATOLOGY/LYMPH         Abnormal bleeding  no.  Easy bruising  no.  Swollen glands  no.         DERMATOLOGY         New/changing skin lesion  no.  Rash  no.  Sores  no.            Negative except as stated in HPI.   Objective:    Vitals:        Wt 217.0, Ht 62, BMI 39.69, Pulse sitting 89, BP sitting 126/78.     Past Results:    Examination:          General Examination         CONSTITUTIONAL: alert, oriented, NAD.          SKIN:  moist, warm.          EYES:  Conjunctiva clear.          LUNGS: good I:E efffort noted,  CTA.          HEART: regular rate and rhythm.          ABDOMEN: soft, non-tender/non-distended, bowel sounds present.          FEMALE GENITOURINARY: normal external genitalia, labia - unremarkable, vagina - pink moist mucosa, no lesions or abnormal discharge, no blood in vaginal vault, cervix - no discharge or lesions or CMT, adnexa - no masses or tenderness, uterus - nontender and normal size on palpation.          PSYCH:  affect normal, good eye contact.      Physical Examination:         Pt aware of scribe services today.    Assessment:     Assessment:    Postmenopausal bleeding - N95.0 (Primary)      Thickened endometrium - R93.89      Hypothyroidism - E03.9        Plan:    Treatment:      Postmenopausal bleeding          Notes: Pt is scheduled for hysteroscopy/D&C on October 14, 2019 to rule out  hyperplasia or malignancy. Discussed w/ pt risks of hysteroscopy including but not limited to infection/bleeding, possible perforation of uterus, with the need for further surgery. Pt advised to avoid NSAIDs (Aspirin, Aleve, Advil, Ibuprofen, Motrin) from now until surgery. She is advised to avoid eating or drinking starting midnight prior to surgery. Discussed post-surgery avoidance of driving for 24 hrs and avoidance of intercourse for 2 weeks after procedure. Follow up for 2 wk post-op visit.      Thickened endometrium          Notes: U/S revealed uterus measuring 9.2 x 6.0 x 6.4 cm. Multiple fibroids were noted with the largest fundal measuring 4.2 cm (previously 4.8 cm). LT posterior fibroid measured 2.6 cm (previously 2.8 cm). Endometrium thickened at 15 mm. Bilat OV WNL. Pt is scheduled for hysteroscopy/D&C on October 14, 2019 to rule out hyperplasia or malignancy. Follow up for 2 wk post-op visit.      Hypothyroidism          Lab:TSH            ZTI:WPYK T4            Notes: Will check thyroid hormone levels today.     Procedures:          Venipuncture        Venipuncture: Gasanova,Svetlana 10/08/2019 10:39:40 AM > , performed in left arm.        Scribe Documentation        Attestation: I personally scribed for Dr. Landry Mellow on the date of this appointment. Electronically signed by scribe , Sandrea Hammond 10/08/2019 09:54:54 AM > .      Immunizations:    Therapeutic Injections:    Diagnostic Imaging:    Lab Reports:          DXI:PJAS T4                    Value  Reference Range                    FREE T4 -  0.80  0.61-1.12 - ng/dL           Lab:TSH                    Value  Reference Range  TSH -  2.37  0.34-4.50 - UlU/mL

## 2019-10-14 ENCOUNTER — Ambulatory Visit (HOSPITAL_BASED_OUTPATIENT_CLINIC_OR_DEPARTMENT_OTHER): Payer: 59 | Admitting: Certified Registered"

## 2019-10-14 ENCOUNTER — Other Ambulatory Visit: Payer: Self-pay

## 2019-10-14 ENCOUNTER — Encounter (HOSPITAL_BASED_OUTPATIENT_CLINIC_OR_DEPARTMENT_OTHER)
Admission: RE | Disposition: A | Discharge status: Home / Self Care | Payer: Self-pay | Source: Home / Self Care | Attending: Obstetrics and Gynecology

## 2019-10-14 ENCOUNTER — Ambulatory Visit (HOSPITAL_BASED_OUTPATIENT_CLINIC_OR_DEPARTMENT_OTHER)
Admission: RE | Admit: 2019-10-14 | Discharge: 2019-10-14 | Disposition: A | Discharge status: Home / Self Care | Payer: 59 | Attending: Obstetrics and Gynecology | Admitting: Obstetrics and Gynecology

## 2019-10-14 ENCOUNTER — Encounter (HOSPITAL_BASED_OUTPATIENT_CLINIC_OR_DEPARTMENT_OTHER): Payer: Self-pay | Admitting: Obstetrics and Gynecology

## 2019-10-14 DIAGNOSIS — F419 Anxiety disorder, unspecified: Secondary | ICD-10-CM | POA: Insufficient documentation

## 2019-10-14 DIAGNOSIS — Z79899 Other long term (current) drug therapy: Secondary | ICD-10-CM | POA: Insufficient documentation

## 2019-10-14 DIAGNOSIS — M1990 Unspecified osteoarthritis, unspecified site: Secondary | ICD-10-CM | POA: Insufficient documentation

## 2019-10-14 DIAGNOSIS — M797 Fibromyalgia: Secondary | ICD-10-CM | POA: Diagnosis not present

## 2019-10-14 DIAGNOSIS — Z8249 Family history of ischemic heart disease and other diseases of the circulatory system: Secondary | ICD-10-CM | POA: Diagnosis not present

## 2019-10-14 DIAGNOSIS — N840 Polyp of corpus uteri: Secondary | ICD-10-CM | POA: Diagnosis not present

## 2019-10-14 DIAGNOSIS — I10 Essential (primary) hypertension: Secondary | ICD-10-CM | POA: Diagnosis not present

## 2019-10-14 DIAGNOSIS — N950 Postmenopausal bleeding: Secondary | ICD-10-CM | POA: Insufficient documentation

## 2019-10-14 DIAGNOSIS — Z8049 Family history of malignant neoplasm of other genital organs: Secondary | ICD-10-CM | POA: Insufficient documentation

## 2019-10-14 HISTORY — DX: Essential (primary) hypertension: I10

## 2019-10-14 HISTORY — DX: Gastro-esophageal reflux disease without esophagitis: K21.9

## 2019-10-14 HISTORY — PX: DILATATION & CURETTAGE/HYSTEROSCOPY WITH MYOSURE: SHX6511

## 2019-10-14 LAB — BASIC METABOLIC PANEL
Anion gap: 13 (ref 5–15)
BUN: 7 mg/dL (ref 6–20)
CO2: 24 mmol/L (ref 22–32)
Calcium: 9.3 mg/dL (ref 8.9–10.3)
Chloride: 101 mmol/L (ref 98–111)
Creatinine, Ser: 0.68 mg/dL (ref 0.44–1.00)
GFR calc Af Amer: >60 mL/min (ref >60)
GFR calc non Af Amer: >60 mL/min (ref >60)
Glucose, Bld: 92 mg/dL (ref 70–99)
Potassium: 4 mmol/L (ref 3.5–5.1)
Sodium: 138 mmol/L (ref 135–145)

## 2019-10-14 LAB — CBC
HCT: 42.1 % (ref 36.0–46.0)
Hemoglobin: 14.2 g/dL (ref 12.0–15.0)
MCH: 30.4 pg (ref 26.0–34.0)
MCHC: 33.7 g/dL (ref 30.0–36.0)
MCV: 90.1 fL (ref 80.0–100.0)
Platelets: 265 10*3/uL (ref 150–400)
RBC: 4.67 MIL/uL (ref 3.87–5.11)
RDW: 12.6 % (ref 11.5–15.5)
WBC: 9.9 10*3/uL (ref 4.0–10.5)
nRBC: 0 % (ref 0.0–0.2)

## 2019-10-14 SURGERY — DILATATION & CURETTAGE/HYSTEROSCOPY WITH MYOSURE
Anesthesia: General | Site: Uterus

## 2019-10-14 MED ORDER — CELECOXIB 200 MG PO CAPS
ORAL_CAPSULE | ORAL | Status: AC
  Filled 2019-10-14: qty 2

## 2019-10-14 MED ORDER — LACTATED RINGERS IV SOLN: INTRAVENOUS | Status: DC

## 2019-10-14 MED ORDER — DEXAMETHASONE SODIUM PHOSPHATE 10 MG/ML IJ SOLN
INTRAMUSCULAR | Status: DC | PRN
  Administered 2019-10-14: 5 mg via INTRAVENOUS

## 2019-10-14 MED ORDER — OXYCODONE HCL 5 MG PO TABS: 5.0000 mg | ORAL_TABLET | Freq: Once | ORAL | Status: DC | PRN

## 2019-10-14 MED ORDER — PROPOFOL 10 MG/ML IV BOLUS
INTRAVENOUS | Status: DC | PRN
  Administered 2019-10-14: 200 mg via INTRAVENOUS

## 2019-10-14 MED ORDER — HYDROCODONE-ACETAMINOPHEN 5-325 MG PO TABS
ORAL_TABLET | ORAL | Status: AC
  Filled 2019-10-14: qty 1

## 2019-10-14 MED ORDER — HYDROMORPHONE HCL 1 MG/ML IJ SOLN: 0.2500 mg | INTRAMUSCULAR | Status: DC | PRN

## 2019-10-14 MED ORDER — PROMETHAZINE HCL 25 MG/ML IJ SOLN: 6.2500 mg | INTRAMUSCULAR | Status: DC | PRN

## 2019-10-14 MED ORDER — FENTANYL CITRATE (PF) 100 MCG/2ML IJ SOLN
INTRAMUSCULAR | Status: AC
  Filled 2019-10-14: qty 2

## 2019-10-14 MED ORDER — LIDOCAINE HCL (CARDIAC) PF 100 MG/5ML IV SOSY
PREFILLED_SYRINGE | INTRAVENOUS | Status: DC | PRN
  Administered 2019-10-14: 100 mg via INTRAVENOUS

## 2019-10-14 MED ORDER — LIDOCAINE 2% (20 MG/ML) 5 ML SYRINGE
INTRAMUSCULAR | Status: AC
  Filled 2019-10-14: qty 5

## 2019-10-14 MED ORDER — HYDROCODONE-ACETAMINOPHEN 5-325 MG PO TABS
1.0000 | ORAL_TABLET | Freq: Once | ORAL | Status: AC | PRN
  Administered 2019-10-14: 1 via ORAL

## 2019-10-14 MED ORDER — ONDANSETRON HCL 4 MG/2ML IJ SOLN
INTRAMUSCULAR | Status: AC
  Filled 2019-10-14: qty 2

## 2019-10-14 MED ORDER — BUPIVACAINE HCL (PF) 0.25 % IJ SOLN
INTRAMUSCULAR | Status: DC | PRN
  Administered 2019-10-14: 20 mL

## 2019-10-14 MED ORDER — SODIUM CHLORIDE 0.9 % IR SOLN
Status: DC | PRN
  Administered 2019-10-14: 3000 mL

## 2019-10-14 MED ORDER — MEPERIDINE HCL 25 MG/ML IJ SOLN: 6.2500 mg | INTRAMUSCULAR | Status: DC | PRN

## 2019-10-14 MED ORDER — IBUPROFEN 800 MG PO TABS: 800.0000 mg | ORAL_TABLET | Freq: Three times a day (TID) | ORAL | 0 refills | Status: DC | PRN

## 2019-10-14 MED ORDER — CELECOXIB 200 MG PO CAPS
400.0000 mg | ORAL_CAPSULE | ORAL | Status: AC
  Administered 2019-10-14: 400 mg via ORAL

## 2019-10-14 MED ORDER — ACETAMINOPHEN 500 MG PO TABS: 1000.0000 mg | ORAL_TABLET | Freq: Four times a day (QID) | ORAL | 0 refills | Status: DC | PRN

## 2019-10-14 MED ORDER — FENTANYL CITRATE (PF) 100 MCG/2ML IJ SOLN
INTRAMUSCULAR | Status: DC | PRN
  Administered 2019-10-14: 50 ug via INTRAVENOUS
  Administered 2019-10-14 (×2): 25 ug via INTRAVENOUS

## 2019-10-14 MED ORDER — KETOROLAC TROMETHAMINE 30 MG/ML IJ SOLN
INTRAMUSCULAR | Status: DC | PRN
Start: 2019-10-14 — End: 2019-10-14
  Administered 2019-10-14: 30 mg via INTRAVENOUS

## 2019-10-14 MED ORDER — KETOROLAC TROMETHAMINE 30 MG/ML IJ SOLN
INTRAMUSCULAR | Status: AC
  Filled 2019-10-14: qty 1

## 2019-10-14 MED ORDER — MIDAZOLAM HCL 2 MG/2ML IJ SOLN
INTRAMUSCULAR | Status: DC | PRN
  Administered 2019-10-14: 2 mg via INTRAVENOUS

## 2019-10-14 MED ORDER — MIDAZOLAM HCL 2 MG/2ML IJ SOLN
INTRAMUSCULAR | Status: AC
  Filled 2019-10-14: qty 2

## 2019-10-14 MED ORDER — PROPOFOL 10 MG/ML IV BOLUS
INTRAVENOUS | Status: AC
  Filled 2019-10-14: qty 20

## 2019-10-14 MED ORDER — ONDANSETRON HCL 4 MG/2ML IJ SOLN
INTRAMUSCULAR | Status: DC | PRN
  Administered 2019-10-14: 4 mg via INTRAVENOUS

## 2019-10-14 MED ORDER — DEXAMETHASONE SODIUM PHOSPHATE 10 MG/ML IJ SOLN
INTRAMUSCULAR | Status: AC
  Filled 2019-10-14: qty 1

## 2019-10-14 MED ORDER — OXYCODONE HCL 5 MG/5ML PO SOLN: 5.0000 mg | Freq: Once | ORAL | Status: DC | PRN

## 2019-10-14 SURGICAL SUPPLY — 17 items
CATH ROBINSON RED A/P 16FR (CATHETERS) IMPLANT
DEVICE MYOSURE LITE (MISCELLANEOUS) IMPLANT
DEVICE MYOSURE REACH (MISCELLANEOUS) ×1 IMPLANT
GAUZE 4X4 16PLY RFD (DISPOSABLE) ×2 IMPLANT
GLOVE BIOGEL M 6.5 STRL (GLOVE) ×2 IMPLANT
GLOVE BIOGEL PI IND STRL 6.5 (GLOVE) ×1 IMPLANT
GLOVE BIOGEL PI IND STRL 7.0 (GLOVE) ×1 IMPLANT
GLOVE BIOGEL PI INDICATOR 6.5 (GLOVE) ×1
GLOVE BIOGEL PI INDICATOR 7.0 (GLOVE) ×1
GOWN STRL REUS W/TWL LRG LVL3 (GOWN DISPOSABLE) ×4 IMPLANT
KIT PROCEDURE FLUENT (KITS) ×2 IMPLANT
PACK VAGINAL MINOR WOMEN LF (CUSTOM PROCEDURE TRAY) ×2 IMPLANT
PAD OB MATERNITY 4.3X12.25 (PERSONAL CARE ITEMS) ×2 IMPLANT
PAD PREP 24X48 CUFFED NSTRL (MISCELLANEOUS) ×2 IMPLANT
SEAL ROD LENS SCOPE MYOSURE (ABLATOR) ×2 IMPLANT
SLEEVE SCD COMPRESS KNEE MED (MISCELLANEOUS) ×2 IMPLANT
TOWEL GREEN STERILE FF (TOWEL DISPOSABLE) ×2 IMPLANT

## 2019-10-14 NOTE — Discharge Instructions (Signed)
No Ibuprofen until 8:30pm if needed   Post Anesthesia Home Care Instructions  Activity: Get plenty of rest for the remainder of the day. A responsible individual must stay with you for 24 hours following the procedure.  For the next 24 hours, DO NOT: -Drive a car -Paediatric nurse -Drink alcoholic beverages -Take any medication unless instructed by your physician -Make any legal decisions or sign important papers.  Meals: Start with liquid foods such as gelatin or soup. Progress to regular foods as tolerated. Avoid greasy, spicy, heavy foods. If nausea and/or vomiting occur, drink only clear liquids until the nausea and/or vomiting subsides. Call your physician if vomiting continues.  Special Instructions/Symptoms: Your throat may feel dry or sore from the anesthesia or the breathing tube placed in your throat during surgery. If this causes discomfort, gargle with warm salt water. The discomfort should disappear within 24 hours.  If you had a scopolamine patch placed behind your ear for the management of post- operative nausea and/or vomiting:  1. The medication in the patch is effective for 72 hours, after which it should be removed.  Wrap patch in a tissue and discard in the trash. Wash hands thoroughly with soap and water. 2. You may remove the patch earlier than 72 hours if you experience unpleasant side effects which may include dry mouth, dizziness or visual disturbances. 3. Avoid touching the patch. Wash your hands with soap and water after contact with the patch.

## 2019-10-14 NOTE — H&P (Signed)
Date of Initial H&P: 10/13/2019  History reviewed, patient examined, no change in status, stable for surgery.

## 2019-10-14 NOTE — Op Note (Signed)
10/14/2019  2:34 PM  PATIENT:  Donna Norton  54 y.o. female  PRE-OPERATIVE DIAGNOSIS:  N95.0-Postmenopausal bleeding  POST-OPERATIVE DIAGNOSIS:  N95.0-Postmenopausal bleeding  PROCEDURE:  Procedure(s): DILATATION & CURETTAGE/HYSTEROSCOPY WITH MYOSURE (N/A)  SURGEON:  Surgeon(s) and Role:    Christophe Louis, MD - Primary  PHYSICIAN ASSISTANT:   ASSISTANTS: none   ANESTHESIA:   general  EBL:  5 mL   BLOOD ADMINISTERED:none  DRAINS: none   LOCAL MEDICATIONS USED:  MARCAINE     SPECIMEN:  Source of Specimen:  endometrial polyp and endometrial currettings   DISPOSITION OF SPECIMEN:  PATHOLOGY  COUNTS:  YES  TOURNIQUET:  * No tourniquets in log *  DICTATION: .Dragon Dictation  PLAN OF CARE: Discharge to home after PACU  PATIENT DISPOSITION:  PACU - hemodynamically stable.   Delay start of Pharmacological VTE agent (>24hrs) due to surgical blood loss or risk of bleeding: not applicable  Findings: Normal external genitalia.Marland Kitchen normal appearing cervix... atrophic appearing endometrium.. endometrial polyp noted.   Procedure: Patient was taken to the operating room where she was placed under general anesthesia. She was placed in the dorsal lithotomy position. She was prepped and draped in the usual sterile fashion. A speculum was placed into the vaginal vault. The anterior lip of the cervix was grasped with a single-tooth tenaculum. Quarter percent Marcaine was injected at the 4 and 8:00 positions of the cervix. The cervix was then sounded to 7 cm. The cervix was dilated to approximately 6 mm. Mysosure operative  hysteroscope was inserted. The findings noted above. Myosure reach  blade was introduced throught the hysteroscope. The endometrial mass was removed in  Less than 5 minutes.  Endometrial currettings were collected with the reach blade. There was no evidence of perforation. Hysteroscope was then removed.   The single-tooth tenaculum was removed. Excellent hemostasis was  noted. The speculum was removed from the patient's vagina. She was awakened from anesthesia  And taken  To the recovery  roomin stable condition. Sponge lap and needle counts were correct x2.    Saline deficit was 450 cc

## 2019-10-14 NOTE — Anesthesia Preprocedure Evaluation (Addendum)
Anesthesia Evaluation  Patient identified by MRN, date of birth, ID band Patient awake    Reviewed: Allergy & Precautions, NPO status , Patient's Chart, lab work & pertinent test results  Airway Mallampati: II  TM Distance: >3 FB Neck ROM: Full    Dental  (+) Dental Advisory Given, Teeth Intact   Pulmonary pneumonia,    Pulmonary exam normal breath sounds clear to auscultation       Cardiovascular hypertension, Pt. on medications Normal cardiovascular exam Rhythm:Regular Rate:Normal     Neuro/Psych  Headaches,    GI/Hepatic Neg liver ROS, GERD  ,  Endo/Other  negative endocrine ROS  Renal/GU negative Renal ROS     Musculoskeletal  (+) Arthritis , Fibromyalgia -  Abdominal (+) + obese,   Peds  Hematology negative hematology ROS (+)   Anesthesia Other Findings   Reproductive/Obstetrics                            Anesthesia Physical Anesthesia Plan  ASA: III  Anesthesia Plan: General   Post-op Pain Management:    Induction: Intravenous  PONV Risk Score and Plan: 4 or greater and Dexamethasone, Ondansetron, Midazolam and Treatment may vary due to age or medical condition  Airway Management Planned: LMA  Additional Equipment: None  Intra-op Plan:   Post-operative Plan: Extubation in OR  Informed Consent: I have reviewed the patients History and Physical, chart, labs and discussed the procedure including the risks, benefits and alternatives for the proposed anesthesia with the patient or authorized representative who has indicated his/her understanding and acceptance.     Dental advisory given  Plan Discussed with: CRNA  Anesthesia Plan Comments:        Anesthesia Quick Evaluation

## 2019-10-14 NOTE — Anesthesia Procedure Notes (Signed)
Procedure Name: LMA Insertion Date/Time: 10/14/2019 2:06 PM Performed by: Raenette Rover, CRNA Pre-anesthesia Checklist: Patient identified, Emergency Drugs available, Suction available and Patient being monitored Patient Re-evaluated:Patient Re-evaluated prior to induction Oxygen Delivery Method: Circle system utilized Preoxygenation: Pre-oxygenation with 100% oxygen Induction Type: IV induction LMA: LMA inserted LMA Size: 4.0 Number of attempts: 1 Placement Confirmation: positive ETCO2 and breath sounds checked- equal and bilateral Tube secured with: Tape Dental Injury: Teeth and Oropharynx as per pre-operative assessment

## 2019-10-14 NOTE — Transfer of Care (Signed)
Immediate Anesthesia Transfer of Care Note  Patient: Donna Norton  Procedure(s) Performed: DILATATION & CURETTAGE/HYSTEROSCOPY WITH MYOSURE (N/A Uterus)  Patient Location: PACU  Anesthesia Type:General  Level of Consciousness: drowsy and patient cooperative  Airway & Oxygen Therapy: Patient Spontanous Breathing and Patient connected to face mask oxygen  Post-op Assessment: Report given to RN and Post -op Vital signs reviewed and stable  Post vital signs: Reviewed and stable  Last Vitals:  Vitals Value Taken Time  BP 114/78 10/14/19 1439  Temp    Pulse 83 10/14/19 1440  Resp 16 10/14/19 1440  SpO2 100 % 10/14/19 1440  Vitals shown include unvalidated device data.  Last Pain:  Vitals:   10/14/19 1256  TempSrc: Oral  PainSc: 2       Patients Stated Pain Goal: 1 (34/19/37 9024)  Complications: No complications documented.

## 2019-10-14 NOTE — Anesthesia Postprocedure Evaluation (Signed)
Anesthesia Post Note  Patient: Donna Norton  Procedure(s) Performed: DILATATION & CURETTAGE/HYSTEROSCOPY WITH MYOSURE (N/A Uterus)     Patient location during evaluation: PACU Anesthesia Type: General Level of consciousness: sedated and patient cooperative Pain management: pain level controlled Vital Signs Assessment: post-procedure vital signs reviewed and stable Respiratory status: spontaneous breathing Cardiovascular status: stable Anesthetic complications: no   No complications documented.  Last Vitals:  Vitals:   10/14/19 1515 10/14/19 1545  BP: 116/79 122/85  Pulse: 62 62  Resp: 13 16  Temp:  36.5 C  SpO2: 99% 98%    Last Pain:  Vitals:   10/14/19 1545  TempSrc:   PainSc: [name]

## 2019-10-15 ENCOUNTER — Encounter (HOSPITAL_BASED_OUTPATIENT_CLINIC_OR_DEPARTMENT_OTHER): Payer: Self-pay | Admitting: Obstetrics and Gynecology

## 2019-10-15 LAB — SURGICAL PATHOLOGY

## 2020-05-24 HISTORY — PX: OTHER SURGICAL HISTORY: SHX169

## 2020-06-07 ENCOUNTER — Telehealth: Payer: Self-pay | Admitting: *Deleted

## 2020-06-07 ENCOUNTER — Other Ambulatory Visit: Payer: Self-pay | Admitting: *Deleted

## 2020-06-07 ENCOUNTER — Ambulatory Visit (INDEPENDENT_AMBULATORY_CARE_PROVIDER_SITE_OTHER): Payer: 59

## 2020-06-07 DIAGNOSIS — R002 Palpitations: Secondary | ICD-10-CM | POA: Diagnosis not present

## 2020-06-07 NOTE — Telephone Encounter (Signed)
Please contact Vinetta Brach in monitor department at [phone].

## 2020-06-23 ENCOUNTER — Telehealth: Payer: Self-pay | Admitting: *Deleted

## 2020-06-23 NOTE — Telephone Encounter (Signed)
Reviewed instructions on applying 14 day [employer] Long term holter monitor.

## 2020-07-15 ENCOUNTER — Other Ambulatory Visit: Payer: Self-pay | Admitting: Obstetrics and Gynecology

## 2020-08-10 ENCOUNTER — Encounter (HOSPITAL_COMMUNITY): Payer: Self-pay | Admitting: Emergency Medicine

## 2020-08-10 ENCOUNTER — Emergency Department (HOSPITAL_COMMUNITY)
Admission: EM | Admit: 2020-08-10 | Discharge: 2020-08-10 | Disposition: A | Discharge status: Home / Self Care | Payer: 59 | Attending: Emergency Medicine | Admitting: Emergency Medicine

## 2020-08-10 ENCOUNTER — Other Ambulatory Visit: Payer: Self-pay

## 2020-08-10 DIAGNOSIS — N939 Abnormal uterine and vaginal bleeding, unspecified: Secondary | ICD-10-CM | POA: Diagnosis not present

## 2020-08-10 DIAGNOSIS — I10 Essential (primary) hypertension: Secondary | ICD-10-CM | POA: Diagnosis not present

## 2020-08-10 LAB — CBC WITH DIFFERENTIAL/PLATELET
Abs Immature Granulocytes: 0.03 10*3/uL (ref 0.00–0.07)
Basophils Absolute: 0 10*3/uL (ref 0.0–0.1)
Basophils Relative: 0 %
Eosinophils Absolute: 0.1 10*3/uL (ref 0.0–0.5)
Eosinophils Relative: 1 %
HCT: 38.8 % (ref 36.0–46.0)
Hemoglobin: 13.1 g/dL (ref 12.0–15.0)
Immature Granulocytes: 0 %
Lymphocytes Relative: 22 %
Lymphs Abs: 2.1 10*3/uL (ref 0.7–4.0)
MCH: 31 pg (ref 26.0–34.0)
MCHC: 33.8 g/dL (ref 30.0–36.0)
MCV: 91.7 fL (ref 80.0–100.0)
Monocytes Absolute: 0.6 10*3/uL (ref 0.1–1.0)
Monocytes Relative: 6 %
Neutro Abs: 6.5 10*3/uL (ref 1.7–7.7)
Neutrophils Relative %: 71 %
Platelets: 225 10*3/uL (ref 150–400)
RBC: 4.23 MIL/uL (ref 3.87–5.11)
RDW: 12.5 % (ref 11.5–15.5)
WBC: 9.4 10*3/uL (ref 4.0–10.5)
nRBC: 0 % (ref 0.0–0.2)

## 2020-08-10 LAB — BASIC METABOLIC PANEL
Anion gap: 8 (ref 5–15)
BUN: 11 mg/dL (ref 6–20)
CO2: 24 mmol/L (ref 22–32)
Calcium: 9.1 mg/dL (ref 8.9–10.3)
Chloride: 105 mmol/L (ref 98–111)
Creatinine, Ser: 0.63 mg/dL (ref 0.44–1.00)
GFR, Estimated: >60 mL/min (ref >60)
Glucose, Bld: 136 mg/dL — ABNORMAL HIGH (ref 70–99)
Potassium: 4 mmol/L (ref 3.5–5.1)
Sodium: 137 mmol/L (ref 135–145)

## 2020-08-10 MED ORDER — SODIUM CHLORIDE 0.9 % IV BOLUS
1000.0000 mL | Freq: Once | INTRAVENOUS | Status: AC
  Administered 2020-08-10: 1000 mL via INTRAVENOUS

## 2020-08-10 NOTE — Discharge Instructions (Addendum)
Call Dr. Landry Mellow, if you are still having vaginal bleeding tomorrow.  Continue taking the tranexamic acid, as prescribed.  Make sure you are getting plenty of rest, and drink a lot of fluids.

## 2020-08-10 NOTE — ED Triage Notes (Signed)
Pt here from home with c/o vaginal bleeding after a biopsy , pt has had a progesterone  to help slow the bleeding, bleeding stopped for 5 days and now has been bleeding for 1 week

## 2020-08-10 NOTE — ED Provider Notes (Signed)
[facility] EMERGENCY DEPARTMENT Provider Note   CSN: [id number] Arrival date & time: 08/10/20  0938     History No chief complaint on file.   Donna Norton is a 55 y.o. female.  HPI She presents for vaginal bleeding, off and on for 8 weeks, following endometrial biopsy.  He saw her gynecologist, 3 days ago and was started on transischemic acid, 1300 mg 3 times daily.  She had an ultrasound done, yesterday, results are unknown.  She called her gynecologist this morning was referred to the ED.  Patient describes having intermittent vaginal bleeding, and having at least one other biopsy, within the last 3 years.  At this time she is having ongoing uterine cramping, passing blood vaginally with clots.  She is using ibuprofen with partial relief of her pain.  She has nausea without vomiting.  She is able to eat.  She feels dizzy when she walks.  She denies chest pain or shortness of breath.  No chronic bleeding disorders.  She is not taking anticoagulants or antiplatelet medications.  She does not have a cardiac history that she knows of.  She is here with her daughter.  There are no other known modifying factors.    Past Medical History:  Diagnosis Date  . Arthritis   . Chronic cough   . Chronic headache   . Fibromyalgia   . Generalized weakness   . GERD (gastroesophageal reflux disease)    diet controlled  . Hypertension    currently no meds, controlled, checks at home  . Meningitis   . Paresthesias    chronic  . PNA (pneumonia)    "a year ago"  . Thyroid disease     Patient Active Problem List   Diagnosis Date Noted  . Essential hypertension, benign 05/31/2013  . Cough 05/13/2013    Past Surgical History:  Procedure Laterality Date  . DILATATION & CURETTAGE/HYSTEROSCOPY WITH MYOSURE N/A 10/14/2019   Procedure: DILATATION & CURETTAGE/HYSTEROSCOPY WITH MYOSURE;  Surgeon: Christophe Louis, MD;  Location: [place];  Service: Gynecology;   Laterality: N/A;  . NO PAST SURGERIES    . None       OB History   No obstetric history on file.     Family History  Problem Relation Age of Onset  . Hypertension Mother   . Heart disease Father        5 stents  . Allergies Child   . Asthma Grandchild   . Cancer - Cervical Sister     Social History   Tobacco Use  . Smoking status: Never Smoker  . Smokeless tobacco: Never Used  Vaping Use  . Vaping Use: Never used  Substance Use Topics  . Alcohol use: Yes    Comment: occasional sangria - rare  . Drug use: No    Home Medications Prior to Admission medications   Medication Sig Start Date End Date Taking? Authorizing Provider  ibuprofen (ADVIL) 800 MG tablet Take 1 tablet (800 mg total) by mouth every 8 (eight) hours as needed. 10/14/19  Yes Christophe Louis, MD  Multiple Vitamins-Minerals (MULTIVITAMIN WITH MINERALS) tablet Take 1 tablet by mouth daily.   Yes [provider]  tranexamic acid (LYSTEDA) 650 MG TABS tablet Take 1,300 mg by mouth 3 (three) times daily.   Yes [provider]  acetaminophen (TYLENOL) 500 MG tablet Take 2 tablets (1,000 mg total) by mouth every 6 (six) hours as needed. Patient not taking: No sig reported 10/14/19  Christophe Louis, MD  melatonin 5 MG TABS Take 5 mg by mouth. Patient not taking: No sig reported    [provider]    Allergies    Atorvastatin, Ceclor [cefaclor], Duloxetine, Hydrocodone-homatropine, Methimazole [methimazole], Percocet [oxycodone-acetaminophen], and Trazodone hcl  Review of Systems   Review of Systems  All other systems reviewed and are negative.   Physical Exam Updated Vital Signs BP 125/69 (BP Location: Left Arm)   Pulse 83   Temp 98.3 F (36.8 C) (Oral)   Resp 16   Ht 5\' 3"  (1.6 m)   Wt 96.6 kg   SpO2 100%   BMI 37.73 kg/m   Physical Exam Vitals and nursing note reviewed.  Constitutional:      Appearance: She is well-developed.  HENT:     Head: Normocephalic and atraumatic.      Right Ear: External ear normal.     Left Ear: External ear normal.  Eyes:     Conjunctiva/sclera: Conjunctivae normal.     Pupils: Pupils are equal, round, and reactive to light.  Neck:     Trachea: Phonation normal.  Cardiovascular:     Rate and Rhythm: Normal rate and regular rhythm.     Heart sounds: Normal heart sounds.  Pulmonary:     Effort: Pulmonary effort is normal.     Breath sounds: Normal breath sounds.  Abdominal:     General: There is no distension.     Palpations: Abdomen is soft.     Tenderness: There is no abdominal tenderness.  Musculoskeletal:        General: Normal range of motion.     Cervical back: Normal range of motion and neck supple.  Skin:    General: Skin is warm and dry.  Neurological:     Mental Status: She is alert and oriented to person, place, and time.     Cranial Nerves: No cranial nerve deficit.     Sensory: No sensory deficit.     Motor: No abnormal muscle tone.     Coordination: Coordination normal.  Psychiatric:        Mood and Affect: Mood normal.        Behavior: Behavior normal.        Thought Content: Thought content normal.        Judgment: Judgment normal.     ED Results / Procedures / Treatments   Labs (all labs ordered are listed, but only abnormal results are displayed) Labs Reviewed  BASIC METABOLIC PANEL - Abnormal; Notable for the following components:      Result Value   Glucose, Bld 136 (*)    All other components within normal limits  CBC WITH DIFFERENTIAL/PLATELET    EKG None  Radiology No results found.  Procedures Procedures   Medications Ordered in ED Medications  sodium chloride 0.9 % bolus 1,000 mL (0 mLs Intravenous Stopped 08/10/20 1205)    ED Course  I have reviewed the triage vital signs and the nursing notes.  Pertinent labs & imaging results that were available during my care of the patient were reviewed by me and considered in my medical decision making (see chart for  details).  Clinical Course as of 08/10/20 1223  Wed Aug 10, 2020  1205 I was able to discuss case with her gynecologist, Dr. Landry Mellow.  She states that the patient can be discharged current treatment, and suggested the patient contact her tomorrow if she is still having heavy menses, and that she would change her  medication regimen. [EW]    Clinical Course User Index [EW] Daleen Bo, MD   MDM Rules/Calculators/A&P                           Patient Vitals for the past 24 hrs:  BP Temp Temp src Pulse Resp SpO2 Height Weight  08/10/20 1202 125/69 -- -- 83 16 100 % -- --  08/10/20 1100 113/67 -- -- 72 (!) 22 97 % -- --  08/10/20 1003 130/71 -- -- 80 16 97 % -- --  08/10/20 1002 -- -- -- -- -- -- 5\' 3"  (1.6 m) 96.6 kg  08/10/20 0947 129/80 98.3 F (36.8 C) Oral 95 16 98 % -- --  08/10/20 0943 (!) 156/86 98.3 F (36.8 C) Oral 96 19 99 % -- --    12:19 PM Reevaluation with update and discussion. After initial assessment and treatment, an updated evaluation reveals patient remains comfortable.  Vital signs are reassuring.  Findings discussed with the patient and all questions were answered. Daleen Bo   Medical Decision Making:  This patient is presenting for evaluation of vaginal bleeding, which does require a range of treatment options, and is a complaint that involves a moderate risk of morbidity and mortality. The differential diagnoses include uterine bleeding, anemia. I decided to review old records, and in summary denies female being treated for abnormal vaginal bleeding, chronic, despite multiple different therapies.  I did not require additional historical information from anyone.  Clinical Laboratory Tests Ordered, included CBC and Metabolic panel. Review indicates normal.   Critical Interventions-clinical evaluation, laboratory testing, observation and reassessment  After These Interventions, the Patient was reevaluated and was found stable for discharge.  Patient with  normal hemoglobin, down 1 g from 2 days ago.  No indication for transfusion or further ED intervention at this time.  Patient is to contact her gynecologist tomorrow if she is still having heavy bleeding at that time, medications may be changed.  CRITICAL CARE-no Performed by: Daleen Bo  Nursing Notes Reviewed/ Care Coordinated Applicable Imaging Reviewed Interpretation of Laboratory Data incorporated into ED treatment  The patient appears reasonably screened and/or stabilized for discharge and I doubt any other medical condition or other [facility] requiring further screening, evaluation, or treatment in the ED at this time prior to discharge.  Plan: Home Medications-continue usual; Home Treatments-rest, fluids; return here if the recommended treatment, does not improve the symptoms; Recommended follow up-regular GYN follow-up and call tomorrow if still concerned about amount of bleeding     Final Clinical Impression(s) / ED Diagnoses Final diagnoses:  Abnormal vaginal bleeding    Rx / DC Orders ED Discharge Orders    None       Daleen Bo, MD 08/10/20 1223

## 2020-08-31 ENCOUNTER — Other Ambulatory Visit: Payer: Self-pay

## 2020-08-31 ENCOUNTER — Encounter (HOSPITAL_BASED_OUTPATIENT_CLINIC_OR_DEPARTMENT_OTHER): Payer: Self-pay | Admitting: Obstetrics and Gynecology

## 2020-08-31 NOTE — Progress Notes (Addendum)
Spoke w/ via phone for pre-op interview---pt Lab needs dos---- none              Lab results------has lab appt 09-02-2020 900 am for cbc bmp t & s ekg (per anesthesia) surgery orders pending COVID test ----- 08-09-2020 posiitve covid home test mentioned in md dr Bettye Boeck on chart Arrive at -------930 am 09-06-2020 NPO after MN NO Solid Food.  Clear liquids from MN until---830 am then npo Med rec completed Medications to take morning of surgery -----norethidrone Diabetic medication -----n/a Patient instructed to bring photo id and insurance card day of surgery Patient aware to have Driver (ride ) / caregiver   Daughter Dana Allan will stay Pre-Op special Istructions -----pt given overnight stay instructions, surgery orders requested dr Landry Mellow epic ib Patient verbalized understanding of instructions that were given at this phone interview. Patient denies shortness of breath, chest pain, fever, cough at this phone interview.   lov dr Doristine Bosworth 08-26-2020 note on chart mentioned hysterectomy surgery for 09-06-2020 on chart

## 2020-08-31 NOTE — Progress Notes (Addendum)
Your procedure is scheduled on 09-06-2020  Report to [place] [facility].   Call this number if you have problems the morning of surgery  :[phone].   OUR ADDRESS IS [address].  WE ARE LOCATED IN [facility].  PLEASE BRING YOUR INSURANCE CARD AND PHOTO ID DAY OF SURGERY.  ONLY ONE PERSON ALLOWED IN FACILITY WAITING AREA.                                     REMEMBER:  DO NOT EAT FOOD, CANDY GUM OR MINTS  AFTER MIDNIGHT . YOU MAY HAVE CLEAR LIQUIDS FROM MIDNIGHT UNTIL 830 AM. NO CLEAR LIQUIDS AFTER  830 AM DAY OF SURGERY.   YOU MAY  BRUSH YOUR TEETH MORNING OF SURGERY AND RINSE YOUR MOUTH OUT, NO CHEWING GUM CANDY OR MINTS.    CLEAR LIQUID DIET   Foods Allowed                                                                     Foods Excluded  Coffee and tea, regular and decaf                             liquids that you cannot  Plain Jell-O any favor except red or purple                                           see through such as: Fruit ices (not with fruit pulp)                                     milk, soups, orange juice  Iced Popsicles                                    All solid food Carbonated beverages, regular and diet                                    Cranberry, grape and apple juices Sports drinks like Gatorade Lightly seasoned clear broth or consume(fat free) Sugar, honey syrup  Sample Menu Breakfast                                Lunch                                     Supper Cranberry juice                    Beef broth  Chicken broth Jell-O                                     Grape juice                           Apple juice Coffee or tea                        Jell-O                                      Popsicle                                                Coffee or tea                        Coffee or tea  _____________________________________________________________________      TAKE THESE MEDICATIONS MORNING OF SURGERY WITH A SIP OF WATER: NORETHIDRONE  ONE VISITOR IS ALLOWED IN WAITING ROOM ONLY DAY OF SURGERY.  NO VISITOR MAY SPEND THE NIGHT.  VISITOR ARE ALLOWED TO STAY UNTIL 800 PM.                                    DO NOT WEAR JEWERLY, MAKE UP. DO NOT WEAR LOTIONS, POWDERS, PERFUMES OR DEODORANT. DO NOT SHAVE FOR 24 HOURS PRIOR TO DAY OF SURGERY. MEN MAY SHAVE FACE AND NECK. CONTACTS, GLASSES, OR DENTURES MAY NOT BE WORN TO SURGERY.                                    [facility] IS NOT RESPONSIBLE  FOR ANY BELONGINGS.                                                                    Marland Kitchen           [place] - Preparing for Surgery Before surgery, you can play an important role.  Because skin is not sterile, your skin needs to be as free of germs as possible.  You can reduce the number of germs on your skin by washing with CHG (chlorahexidine gluconate) soap before surgery.  CHG is an antiseptic cleaner which kills germs and bonds with the skin to continue killing germs even after washing. Please DO NOT use if you have an allergy to CHG or antibacterial soaps.  If your skin becomes reddened/irritated stop using the CHG and inform your nurse when you arrive at Short Stay. Do not shave (including legs and underarms) for at least 48 hours prior to the first CHG shower.  You may shave your face/neck. Please follow these instructions carefully:  1.  Shower with CHG Soap the night before  surgery and the  morning of Surgery.  2.  If you choose to wash your hair, wash your hair first as usual with your  normal  shampoo.  3.  After you shampoo, rinse your hair and body thoroughly to remove the  shampoo.                            4.  Use CHG as you would any other liquid soap.  You can apply chg directly  to the skin and wash                      Gently with a scrungie or clean washcloth.  5.  Apply the CHG Soap to your body ONLY FROM THE NECK DOWN.   Do not use on  face/ open                           Wound or open sores. Avoid contact with eyes, ears mouth and genitals (private parts).                       Wash face,  Genitals (private parts) with your normal soap.             6.  Wash thoroughly, paying special attention to the area where your surgery  will be performed.  7.  Thoroughly rinse your body with warm water from the neck down.  8.  DO NOT shower/wash with your normal soap after using and rinsing off  the CHG Soap.                9.  Pat yourself dry with a clean towel.            10.  Wear clean pajamas.            11.  Place clean sheets on your bed the night of your first shower and do not  sleep with pets. Day of Surgery : Do not apply any lotions/deodorants the morning of surgery.  Please wear clean clothes to the hospital/surgery center.  FAILURE TO FOLLOW THESE INSTRUCTIONS MAY RESULT IN THE CANCELLATION OF YOUR SURGERY PATIENT SIGNATURE_________________________________  NURSE SIGNATURE__________________________________  ________________________________________________________________________                                                        QUESTIONS Donna Norton PRE OP NURSE PHONE [phone]

## 2020-09-02 ENCOUNTER — Other Ambulatory Visit: Payer: Self-pay

## 2020-09-02 ENCOUNTER — Encounter (HOSPITAL_COMMUNITY)
Admission: RE | Admit: 2020-09-02 | Discharge: 2020-09-02 | Disposition: A | Discharge status: Home / Self Care | Payer: 59 | Source: Ambulatory Visit | Attending: Obstetrics and Gynecology | Admitting: Obstetrics and Gynecology

## 2020-09-02 ENCOUNTER — Other Ambulatory Visit (HOSPITAL_COMMUNITY): Payer: 59

## 2020-09-02 ENCOUNTER — Other Ambulatory Visit: Payer: Self-pay | Admitting: Obstetrics and Gynecology

## 2020-09-02 DIAGNOSIS — Z01818 Encounter for other preprocedural examination: Secondary | ICD-10-CM | POA: Insufficient documentation

## 2020-09-02 LAB — BASIC METABOLIC PANEL
Anion gap: 10 (ref 5–15)
BUN: 9 mg/dL (ref 6–20)
CO2: 24 mmol/L (ref 22–32)
Calcium: 9.4 mg/dL (ref 8.9–10.3)
Chloride: 105 mmol/L (ref 98–111)
Creatinine, Ser: 0.7 mg/dL (ref 0.44–1.00)
GFR, Estimated: >60 mL/min (ref >60)
Glucose, Bld: 100 mg/dL — ABNORMAL HIGH (ref 70–99)
Potassium: 4.2 mmol/L (ref 3.5–5.1)
Sodium: 139 mmol/L (ref 135–145)

## 2020-09-02 LAB — CBC
HCT: 39.6 % (ref 36.0–46.0)
Hemoglobin: 13.1 g/dL (ref 12.0–15.0)
MCH: 31 pg (ref 26.0–34.0)
MCHC: 33.1 g/dL (ref 30.0–36.0)
MCV: 93.6 fL (ref 80.0–100.0)
Platelets: 319 10*3/uL (ref 150–400)
RBC: 4.23 MIL/uL (ref 3.87–5.11)
RDW: 13.5 % (ref 11.5–15.5)
WBC: 8.5 10*3/uL (ref 4.0–10.5)
nRBC: 0 % (ref 0.0–0.2)

## 2020-09-05 ENCOUNTER — Other Ambulatory Visit: Payer: Self-pay | Admitting: Obstetrics and Gynecology

## 2020-09-05 NOTE — H&P (Signed)
Subjective: Chief Complaint(s):   Preop/ postmenopausal bleeding and fibroids   HPI:  Isolation Precautions Yes- Pfizer" label="Has patient received COVID-19 vaccination?" propId="[id number]" catId="[id number]" encId="[id number]"Has patient received COVID-19 vaccination? 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 Does patient report new onset of COVID symptoms? No. Has patient or close contact tested positive for COVID-19? No , not in the past 2 weeks.  General 55 yo presents for pre-op visit. Pt is scheduled for robotic assisted laparoscopic hysterectomy w/ bilateral salpingo-oophorectomy on Sep 06, 2020 for management of PMB and AUB. H/o hysteroscopy/D&C w/ polypectomy at [facility] on October 14, 2019. Pathology revealed benign endometrial polyp. No hyperplasia or malignancy seen. Pt seen Mar 31, 2020 c/o light bleeding and RT sided pelvic pain for 2 wks. Endorsed yellow-ish discharge for 1 mo w/ "chemical smell". Denied N/V/D, dysuria, constipation. Pelvic exam revealed scant amt of blood in vaginal vault and RT adnexal tenderness w/ no masses. Ibuprofen 800 MG prescribed for pain. No evidence of UTI on urinalysis. Wet mount negative. Discussion of hysterectomy deferred for next visit. Pt seen May 16, 2020 c/o unresolved pelvic pain. She reported no spotting for over 1 week. Expressed interest in definitive therapy w/ hysterectomy. Plan for EMB if bleeding returned.  U/S on May 16, 2020 revealed uterus measuring 10.1 x 5.9 x 8.3 cm. 4 fibroids were noted w/ the largest measuring 4.2 cm. Endometrium thickened at 9.8 mm and irregular in appearance. Bilat OV WNL. EMB on Jul 15, 2020 revealed no cervical dysplasia or malignancy. Pt seen Jul 21, 2020 c/o light spotting that progressed to heavier bleeding and clotting since EMB on Jul 15, 2020 and blood when urinating. She reported using regular sized pads since then. Pelvic exam revealed normal. HGB 14.4, WNL. Provera 10 MG prescribed. Pt seen Aug 08, 2020 c/o heavy spotting/bleeding after stopping Provera following swelling and pain. She reported changing her pad every 20 minutes, abdominal cramping. Endorsed dizziness, lightheadedness, and intermittent SOB. Advil provided some relief. Pelvic exam revealed 12 wk size uterus and large amt of blood in vaginal vault. CBC w/o diff WNL. Tranexamic 650 MG prescribed for bleeding and Ibuprofen 800 MG prescribed for pain. U/S on Aug 08, 2020 revealed uterus measuring 11.0 x 6.2 x 8.1 cm. Endometrium thickened at 1.06 cm and irregular in appearance. 4 fibroids measured w/ the largest fundal and measuring 4.7 cm. All fibroids stable in size from prev U/S in Jan 2022. Bilat OV WNL. Pt went to ED on Aug 10, 2020 due to worsening bleeding, SOB, nausea, and extreme weakness and fatigue. She was diagnosed w/ COVID. Pt was advised COVID may have disrupted her bleeding patterns. Aygestin 5 MG prescribed which improved her bleeding. Today, pt reports she has no bleeding in office. Last week, pt had some swelling, though it resolved on its own. She has some occasional, infrequent discharge, not much of a concern. She is on Aygestin BID. 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 She is currently taking Ibuprofen for pain management, though mostly for her shoulder. Pt has recovered from [place]. She states she has some mild SOB and mild cough w/ weather changes, though she also endorses a h/o rhinitis and seasonal allergies. She believes she contracted COVID on Easter Sunday.  Pelvic exam revealed 12 week size uterus, otherwise normal. Current Medication: Taking  Norethindrone Acetate 5 MG Tablet TAKE 1 TABLET BY MOUTH TWICE A DAY.     Ibuprofen 800 MG Tablet 1 tablet with food Orally Every 8 hours as  needed.   Not-Taking  Tranexamic Acid 650 MG Tablet 2 tablet Orally 3 times a day.     valACYclovir HCl 500 MG Tablet TAKE 1 TABLET BY MOUTH EVERY 12 HOURS.     diazePAM 5 MG Tablet 1/2 tablet  at bedtime for anxiety/rest.     Rosuvastatin Calcium 5 MG Tablet 1 tablet Orally Once a day, Notes: 1/2.     Multivitamin Adult - Tablet as directed Orally.   Discontinued  Ibuprofen 800 MG Tablet 1 tablet with food or milk as needed Orally every 8 hrs as needed for pain, Notes: very rarely.     Medication List reviewed and reconciled with the patient.  Medical History:  Hyperthyroidism (ablation)/ Hypothyroidism     Osteoarthritis     Fibromyalgia     h/o kidney stones     h/o hypertension     Depression (h/o dysthymia)     High cholesterol (did not tolerate Atorvastatin)     COVID-19 (2022)      Allergies/Intolerance: Methimazole: Side Effects - elevated liver enz Hycodan - headaches/ n/v Atorvastatin Calcium - shoulder pain Trazodone HCl: Side Effects - dizziness Duloxetine: Side Effects - diarrhea Gyn History:  Sexual activity not currently sexually active. Denies Periods : postmenopausal- PMB last week . LMP 10/2010. Denies Birth control none. Last pap smear date 01/26/2020 - neg. Last mammogram date 10/02/2019-negative. Denies Abnormal pap smear. H/O STD Herpes. GYN procedures D/C.   OB History:  Number of pregnancies 3. Pregnancy # 1 live birth, vaginal delivery, girl. Pregnancy # 2 live birth, vaginal delivery, boy. Pregnancy # 3 live birth, vaginal delivery, girl.   Surgical History:  D/C, polypectomy 10/14/2019     colonoscopy 2022   Hospitalization:  Childbirth x 3     Meningitis     Not in the past year 05/2020   Family History:  Father: alive 64 yrs, diagnosed with Diabetes, Coronary artery disease    Mother: alive 72 yrs, diagnosed with Hypertension    Brother 1: alive    Brother2: alive    Brother 3: alive    Sister 1: alive, fibromyalgia, Crohns disease    Sister 2: deceased 45 yrs, cervical cancer    3 brother(s) , 2 sister(s) . 1 son(s) , 2 daughter(s) .    No family hx of colon cancer/polyps or liver disease.  Social  History: General Tobacco use cigarettes: Never smoked, Tobacco history last updated 09/01/2020, Vaping No.  no Alcohol, 1-2 a year.  Caffeine: 1 per day.  no Recreational drug use.  Exercise: three a week (walks, exercise videos).  DENTAL CARE: lacking.  Marital Status: single, Divorced.  Children: 3.  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  COMMUNICATION BARRIERS: none.  one of her 3 children lives with her (1985: Leanna Sato). ROS: CONSTITUTIONAL No" label="Chills" value="" options="no,yes" propid="91" itemid="193425" categoryid="10464" encounterid="[id number]"Chills No. No" label="Fatigue" value="" options="no,yes" propid="91" itemid="[id number]" categoryid="10464" encounterid="[id number]"Fatigue No. No" label="Fever" value="" options="no,yes" propid="91" itemid="10467" categoryid="10464" encounterid="[id number]"Fever No. No" label="Night sweats" value="" options="no,yes" propid="91" itemid="193426" categoryid="10464" encounterid="[id number]"Night sweats No. No" label="Recent travel outside [place]" value="" options="no,yes" propid="91" itemid="444261" categoryid="10464" encounterid="[id number]"Recent travel outside [place] No. No" label="Sweats" value="" options="no,yes" propid="91" itemid="193427" categoryid="10464" encounterid="[id number]"Sweats No. No" label="Weight change" value="" options="no,yes" propid="91" itemid="194825" categoryid="10464" encounterid="[id number]"Weight change No.  OPHTHALMOLOGY no" label="Blurring of vision" value="" options="no,yes" propid="91" itemid="12520" categoryid="12516" encounterid="[id number]"Blurring of vision no. no" label="Change in vision" value="" options="no,yes" propid="91" itemid="193469" categoryid="12516" encounterid="[id number]"Change in vision no. no" label="Double vision" value="" options="no,yes" propid="91" itemid="194379" categoryid="12516" encounterid="[id number]"Double vision no.  ENT no" label="Dizziness" value="" options="no,yes" propid="91" itemid="193612" categoryid="10481"  encounterid="[id number]"Dizziness no. Nose bleeds no. Sore throat no. Teeth pain no.  ALLERGY no" label="Hives" value="" options="no,yes" propid="91" itemid="202589" categoryid="138152" encounterid="[id number]"Hives no.  CARDIOLOGY no" label="Chest pain" value="" options="no,yes" propid="91" itemid="193603" categoryid="10488" encounterid="[id number]"Chest pain no. no" label="High blood pressure" value="" options="no,yes" propid="91" itemid="199089" categoryid="10488" encounterid="[id number]"High blood pressure no. no" label="Irregular heart beat" value="" options="no,yes" propid="91" itemid="202598" categoryid="10488" encounterid="[id number]"Irregular heart beat no. no" label="Leg edema" value="" options="no,yes" propid="91" itemid="10491" categoryid="10488" encounterid="[id number]"Leg edema no. no" label="Palpitations" value="" options="no,yes" propid="91" itemid="10490" categoryid="10488" encounterid="[id number]"Palpitations no.  RESPIRATORY Shortness of breath YES. Cough YES. no" label="Wheezing" value="" options="no,yes" propid="91" itemid="193621" categoryid="138132" encounterid="[id number]"Wheezing no.  UROLOGY no" label="Pain with urination" value="" options="no,yes" propid="91" itemid="194377" categoryid="138166" encounterid="[id number]"Pain with urination no. no" label="Urinary urgency" value="" options="no,yes" propid="91" itemid="193493" categoryid="138166" encounterid="[id number]"Urinary urgency no. no" label="Urinary frequency" value="" options="no,yes" propid="91" itemid="193492" categoryid="138166" encounterid="[id number]"Urinary frequency no. no" label="Urinary incontinence" value="" options="no,yes" propid="91" itemid="138171" categoryid="138166" encounterid="[id number]"Urinary incontinence no. No" label="Difficulty urinating" value="" options="no,yes" propid="91" itemid="138167" categoryid="138166" encounterid="[id number]"Difficulty urinating No. No" label="Blood in urine" value="" options="no,yes" propid="91"  itemid="138168" categoryid="138166" encounterid="[id number]"Blood in urine No.  GASTROENTEROLOGY no" label="Abdominal pain" value="" options="no,yes" propid="91" itemid="10496" categoryid="10494" encounterid="[id number]"Abdominal pain no. no" label="Appetite change" value="" options="no,yes" propid="91" itemid="193447" categoryid="10494" encounterid="[id number]"Appetite change no. Bloating/belching YES. no" label="Blood in stool or on toilet paper" value="" options="no,yes" propid="91" itemid="10503" categoryid="10494" encounterid="[id number]"Blood in stool or on toilet paper no. no" label="Change in bowel movements" value="" options="no,yes" propid="91" itemid="199106" categoryid="10494" encounterid="[id number]"Change in bowel movements no. no" label="Constipation" value="" options="no,yes" propid="91" itemid="10501" categoryid="10494" encounterid="[id number]"Constipation no. no" label="Diarrhea" value="" options="no,yes" propid="91" itemid="10502" categoryid="10494" encounterid="[id number]"Diarrhea no. Difficulty swallowing YES. no" label="Nausea" value="" options="no,yes" propid="91" itemid="10499" categoryid="10494" encounterid="[id number]"Nausea no.  FEMALE REPRODUCTIVE no" label="Vulvar pain" value="" options="no,yes" propid="91" itemid="453725" categoryid="10525" encounterid="[id number]"Vulvar pain no. no" label="Vulvar rash" value="" options="no,yes" propid="91" itemid="453726" categoryid="10525" encounterid="[id number]"Vulvar rash no. Abnormal vaginal bleeding YES. Breast pain YES. no" label="Nipple discharge" value="" options="no,yes" propid="91" itemid="186084" categoryid="10525" encounterid="[id number]"Nipple discharge no. no" label="Pain with intercourse" value="" options="no,yes" propid="91" itemid="275823" categoryid="10525" encounterid="[id number]"Pain with intercourse no. no" label="Pelvic pain" value="" options="no,yes" propid="91" itemid="186082" categoryid="10525" encounterid="[id number]"Pelvic pain no. no"  label="Unusual vaginal discharge" value="" options="no,yes" propid="91" itemid="278230" categoryid="10525" encounterid="[id number]"Unusual vaginal discharge no. no" label="Vaginal itching" value="" options="no,yes" propid="91" itemid="278942" categoryid="10525" encounterid="[id number]"Vaginal itching no.  MUSCULOSKELETAL no" label="Muscle aches" value="" options="no,yes" propid="91" itemid="193461" categoryid="10514" encounterid="[id number]"Muscle aches no.  NEUROLOGY no" label="Headache" value="" options="no,yes" propid="91" itemid="12513" categoryid="12512" encounterid="[id number]"Headache no. no" label="Tingling/numbness" value="" options="no,yes" propid="91" itemid="12514" categoryid="12512" encounterid="[id number]"Tingling/numbness no. no" label="Weakness" value="" options="no,yes" propid="91" itemid="193468" categoryid="12512" encounterid="[id number]"Weakness no.  PSYCHOLOGY no" label="Depression" value="" options="" propid="91" itemid="275919" categoryid="10520" encounterid="[id number]"Depression no. no" label="Anxiety" value="" options="no,yes" propid="91" itemid="172748" categoryid="10520" encounterid="[id number]"Anxiety no. no" label="Nervousness" value="" options="no,yes" propid="91" itemid="199158" categoryid="10520" encounterid="[id number]"Nervousness no. no" label="Sleep disturbances" value="" options="no,yes" propid="91" itemid="12502" categoryid="10520" encounterid="[id number]"Sleep disturbances no. no " label="Suicidal ideation" value="" options="no,yes" propid="91" itemid="72718" categoryid="10520" encounterid="[id number]"Suicidal ideation no .  ENDOCRINOLOGY no" label="Excessive thirst" value="" options="no,yes" propid="91" itemid="194628" categoryid="12508" encounterid="[id number]"Excessive thirst no. no" label="Excessive urination" value="" options="no,yes" propid="91" itemid="196285" categoryid="12508" encounterid="[id number]"Excessive urination no. no" label="Hair loss" value="" options="no, yes" propid="91"  itemid="444314" categoryid="12508" encounterid="[id number]"Hair loss no. no" label="Heat or cold intolerance" value="" options="" propid="91" itemid="447284" categoryid="12508" encounterid="[id number]"Heat or cold intolerance no.  HEMATOLOGY/LYMPH no" label="Abnormal bleeding" value="" options="no,yes" propid="91" itemid="199152" categoryid="138157" encounterid="[id number]"Abnormal bleeding no. no" label="Easy bruising" value="" options="no,yes" propid="91" itemid="170653" categoryid="138157" encounterid="[id number]"Easy bruising no. no" label="Swollen glands" value="" options="no,yes" propid="91" itemid="138158" categoryid="138157" encounterid="[id number]"Swollen glands no.  DERMATOLOGY no" label="New/changing skin lesion" value="" options="no,yes" propid="91" itemid="199126" categoryid="12503" encounterid="[id number]"New/changing skin lesion no. no" label="Rash" value="" options="no,yes" propid="91" itemid="12504" categoryid="12503" encounterid="[id number]"Rash no. no" label="Sores" value="" options="" propid="91" itemid="444313" categoryid="12503" encounterid="[id number]"Sores no.  Negative except as stated in HPI.  Objective: Vitals: Wt 214.2, Wt change .6 lb, Ht 62, BMI 39.17, Pulse sitting 80, BP sitting 124/86.  Past Results: Examination:  General Examination alert, oriented, NAD " label="CONSTITUTIONAL:" categoryPropId="10089" examid="193638"CONSTITUTIONAL: alert, oriented, NAD .  moist, warm" label="SKIN:" categoryPropId="10109" examid="193638"SKIN: moist, warm.  Conjunctiva clear" label="EYES:" categoryPropId="21468" examid="193638"EYES: Conjunctiva clear.  LUNGS: good I:E efffort noted, CTA bilat.  HEART: RRR.  soft, non-tender/non-distended, bowel sounds present " label="ABDOMEN:" categoryPropId="88" examid="193638"ABDOMEN: soft, non-tender/non-distended, bowel sounds present .  FEMALE GENITOURINARY: normal external genitalia, labia - unremarkable, vagina - pink moist mucosa, no lesions or abnormal  discharge, cervix - no discharge or lesions or CMT, adnexa - no masses or tenderness, uterus - nontender, 12 week size uterus.  affect normal, good eye contact" label="PSYCH:" categoryPropId="16316" examid="193638"PSYCH: affect normal, good eye contact.  Physical Examination: Chaperone present [facility] 09/01/2020 12:26:26 PM &gt; , for pelvic exam" label="Chaperone present" itemId="[id number]" categoryId="[id number]"Chaperone present [facility] 09/01/2020 12:26:26 PM > , for pelvic exam.  Pt aware of scribe services today.   Assessment: Assessment:  Postmenopausal bleeding - N95.0 (Primary)     Fibroids - D25.9     Plan: Treatment: Postmenopausal bleeding Notes: Pt is scheduled for robotic assisted laparoscopic hysterectomy w/ bilateral salpingo-oophorectomy on Sep 06, 2020 for management of PMB and AUB. Pt advised she can go home the same day, unless converted to open incision, in which case she will stay 2 nights. She is advised that in order to be discharged from hospital, she will need to be able to ambulate, urinate, flatulate, tolerate food by mouth, and take pain medication by mouth. Discussed risks of hysterectomy including but not limited to infection, bleeding, conversion to open incision, damage to her bowel, bladder, or ureters, with the need for further surgery. Discussed risk of blood transfusion and risk of HIV or hep B&C with blood transfusion. Pt is aware of risks and desires blood transfusion if needed. Pt advised to avoid NSAIDs (Aspirin, Aleve, Advil, Ibuprofen, Motrin) from now until surgery given risk of bleeding during surgery. She may take Tylenol for pain management. She is advised to avoid eating or drinking starting midnight prior to surgery. Discussed post-surgery avoidance of driving for 1 week w/ robotic hysterectomy (or 2 weeks with open incision) and avoidance of lifting weight greater than 10 lbs or intercourse for 6-8 weeks after procedure. Follow up in 3 weeks for 2  wk post-op visit. Fibroids Notes: Pt is scheduled for robotic assisted laparoscopic hysterectomy w/ bilateral salpingo-oophorectomy on Sep 06, 2020 for management of PMB and AUB. Follow up in 3 weeks for 2 wk post-op visit.

## 2020-09-06 ENCOUNTER — Encounter (HOSPITAL_BASED_OUTPATIENT_CLINIC_OR_DEPARTMENT_OTHER): Payer: Self-pay | Admitting: Obstetrics and Gynecology

## 2020-09-06 ENCOUNTER — Ambulatory Visit (HOSPITAL_BASED_OUTPATIENT_CLINIC_OR_DEPARTMENT_OTHER): Payer: 59 | Admitting: Anesthesiology

## 2020-09-06 ENCOUNTER — Observation Stay (HOSPITAL_BASED_OUTPATIENT_CLINIC_OR_DEPARTMENT_OTHER)
Admission: RE | Admit: 2020-09-06 | Discharge: 2020-09-07 | Disposition: A | Discharge status: Home / Self Care | Payer: 59 | Attending: Obstetrics and Gynecology | Admitting: Obstetrics and Gynecology

## 2020-09-06 ENCOUNTER — Encounter (HOSPITAL_BASED_OUTPATIENT_CLINIC_OR_DEPARTMENT_OTHER)
Admission: RE | Disposition: A | Discharge status: Home / Self Care | Payer: Self-pay | Source: Home / Self Care | Attending: Obstetrics and Gynecology

## 2020-09-06 DIAGNOSIS — Z90721 Acquired absence of ovaries, unilateral: Secondary | ICD-10-CM | POA: Diagnosis present

## 2020-09-06 DIAGNOSIS — D259 Leiomyoma of uterus, unspecified: Principal | ICD-10-CM | POA: Insufficient documentation

## 2020-09-06 DIAGNOSIS — N950 Postmenopausal bleeding: Secondary | ICD-10-CM | POA: Diagnosis not present

## 2020-09-06 DIAGNOSIS — Z90710 Acquired absence of both cervix and uterus: Secondary | ICD-10-CM | POA: Diagnosis present

## 2020-09-06 HISTORY — DX: Dyspnea, unspecified: R06.00

## 2020-09-06 HISTORY — DX: Post covid-19 condition, unspecified: U09.9

## 2020-09-06 HISTORY — DX: Anxiety disorder, unspecified: F41.9

## 2020-09-06 HISTORY — DX: Pain, unspecified: R52

## 2020-09-06 HISTORY — PX: ROBOTIC ASSISTED LAPAROSCOPIC HYSTERECTOMY AND SALPINGECTOMY: SHX6379

## 2020-09-06 HISTORY — DX: Other sleep disorders: G47.8

## 2020-09-06 HISTORY — DX: COVID-19: U07.1

## 2020-09-06 LAB — TYPE AND SCREEN
ABO/RH(D): O POS
Antibody Screen: NEGATIVE

## 2020-09-06 LAB — CBC
HCT: 41 % (ref 36.0–46.0)
Hemoglobin: 13.6 g/dL (ref 12.0–15.0)
MCH: 31 pg (ref 26.0–34.0)
MCHC: 33.2 g/dL (ref 30.0–36.0)
MCV: 93.4 fL (ref 80.0–100.0)
Platelets: 307 10*3/uL (ref 150–400)
RBC: 4.39 MIL/uL (ref 3.87–5.11)
RDW: 13.2 % (ref 11.5–15.5)
WBC: 10 10*3/uL (ref 4.0–10.5)
nRBC: 0 % (ref 0.0–0.2)

## 2020-09-06 LAB — ABO/RH: ABO/RH(D): O POS

## 2020-09-06 SURGERY — XI ROBOTIC ASSISTED LAPAROSCOPIC HYSTERECTOMY AND SALPINGECTOMY
Anesthesia: General | Site: Abdomen | Laterality: Bilateral

## 2020-09-06 MED ORDER — HYDROMORPHONE HCL 1 MG/ML IJ SOLN: 0.2000 mg | INTRAMUSCULAR | Status: DC | PRN

## 2020-09-06 MED ORDER — SODIUM CHLORIDE 0.9 % IR SOLN
Status: DC | PRN
  Administered 2020-09-06: 1000 mL

## 2020-09-06 MED ORDER — ACETAMINOPHEN 500 MG PO TABS
1000.0000 mg | ORAL_TABLET | Freq: Four times a day (QID) | ORAL | Status: DC
  Administered 2020-09-06 – 2020-09-07 (×3): 1000 mg via ORAL

## 2020-09-06 MED ORDER — ORAL CARE MOUTH RINSE: 15.0000 mL | Freq: Once | OROMUCOSAL | Status: DC

## 2020-09-06 MED ORDER — DEXMEDETOMIDINE (PRECEDEX) IN NS 20 MCG/5ML (4 MCG/ML) IV SYRINGE
PREFILLED_SYRINGE | INTRAVENOUS | Status: DC | PRN
  Administered 2020-09-06 (×4): 4 ug via INTRAVENOUS

## 2020-09-06 MED ORDER — HYDROMORPHONE HCL 2 MG PO TABS
ORAL_TABLET | ORAL | Status: AC
  Filled 2020-09-06: qty 1

## 2020-09-06 MED ORDER — LIDOCAINE 2% (20 MG/ML) 5 ML SYRINGE
INTRAMUSCULAR | Status: DC | PRN
  Administered 2020-09-06: 80 mg via INTRAVENOUS

## 2020-09-06 MED ORDER — SENNA 8.6 MG PO TABS
ORAL_TABLET | ORAL | Status: AC
  Filled 2020-09-06: qty 1

## 2020-09-06 MED ORDER — CIPROFLOXACIN IN D5W 400 MG/200ML IV SOLN
INTRAVENOUS | Status: AC
  Filled 2020-09-06: qty 200

## 2020-09-06 MED ORDER — KETOROLAC TROMETHAMINE 30 MG/ML IJ SOLN
INTRAMUSCULAR | Status: DC | PRN
  Administered 2020-09-06: 30 mg via INTRAVENOUS

## 2020-09-06 MED ORDER — FENTANYL CITRATE (PF) 100 MCG/2ML IJ SOLN
INTRAMUSCULAR | Status: AC
  Filled 2020-09-06: qty 2

## 2020-09-06 MED ORDER — ROCURONIUM BROMIDE 10 MG/ML (PF) SYRINGE
PREFILLED_SYRINGE | INTRAVENOUS | Status: AC
  Filled 2020-09-06: qty 10

## 2020-09-06 MED ORDER — ONDANSETRON HCL 4 MG/2ML IJ SOLN
INTRAMUSCULAR | Status: DC | PRN
  Administered 2020-09-06: 4 mg via INTRAVENOUS

## 2020-09-06 MED ORDER — ACETAMINOPHEN 500 MG PO TABS
ORAL_TABLET | ORAL | Status: AC
  Filled 2020-09-06: qty 2

## 2020-09-06 MED ORDER — ONDANSETRON HCL 4 MG PO TABS: 4.0000 mg | ORAL_TABLET | Freq: Four times a day (QID) | ORAL | Status: DC | PRN

## 2020-09-06 MED ORDER — CLINDAMYCIN PHOSPHATE 900 MG/50ML IV SOLN
900.0000 mg | INTRAVENOUS | Status: AC
  Administered 2020-09-06: 900 mg via INTRAVENOUS

## 2020-09-06 MED ORDER — POVIDONE-IODINE 10 % EX SWAB: 2.0000 "application " | Freq: Once | CUTANEOUS | Status: DC

## 2020-09-06 MED ORDER — PROPOFOL 10 MG/ML IV BOLUS
INTRAVENOUS | Status: AC
  Filled 2020-09-06: qty 20

## 2020-09-06 MED ORDER — ZOLPIDEM TARTRATE 5 MG PO TABS: 5.0000 mg | ORAL_TABLET | Freq: Every evening | ORAL | Status: DC | PRN

## 2020-09-06 MED ORDER — PANTOPRAZOLE SODIUM 40 MG PO TBEC
DELAYED_RELEASE_TABLET | ORAL | Status: AC
  Filled 2020-09-06: qty 1

## 2020-09-06 MED ORDER — MEPERIDINE HCL 25 MG/ML IJ SOLN: 6.2500 mg | INTRAMUSCULAR | Status: DC | PRN

## 2020-09-06 MED ORDER — ALUM & MAG HYDROXIDE-SIMETH 200-200-20 MG/5ML PO SUSP: 30.0000 mL | ORAL | Status: DC | PRN

## 2020-09-06 MED ORDER — FENTANYL CITRATE (PF) 100 MCG/2ML IJ SOLN
INTRAMUSCULAR | Status: DC | PRN
  Administered 2020-09-06: 100 ug via INTRAVENOUS
  Administered 2020-09-06 (×2): 50 ug via INTRAVENOUS

## 2020-09-06 MED ORDER — LIDOCAINE 2% (20 MG/ML) 5 ML SYRINGE
INTRAMUSCULAR | Status: AC
  Filled 2020-09-06: qty 10

## 2020-09-06 MED ORDER — HEMOSTATIC AGENTS (NO CHARGE) OPTIME
TOPICAL | Status: DC | PRN
  Administered 2020-09-06: 1 via TOPICAL

## 2020-09-06 MED ORDER — CHLORHEXIDINE GLUCONATE 0.12 % MT SOLN: 15.0000 mL | Freq: Once | OROMUCOSAL | Status: DC

## 2020-09-06 MED ORDER — SIMETHICONE 80 MG PO CHEW: 80.0000 mg | CHEWABLE_TABLET | Freq: Four times a day (QID) | ORAL | Status: DC | PRN

## 2020-09-06 MED ORDER — FENTANYL CITRATE (PF) 100 MCG/2ML IJ SOLN
25.0000 ug | INTRAMUSCULAR | Status: DC | PRN
  Administered 2020-09-06: 50 ug via INTRAVENOUS

## 2020-09-06 MED ORDER — DEXMEDETOMIDINE (PRECEDEX) IN NS 20 MCG/5ML (4 MCG/ML) IV SYRINGE
PREFILLED_SYRINGE | INTRAVENOUS | Status: AC
  Filled 2020-09-06: qty 5

## 2020-09-06 MED ORDER — ROCURONIUM BROMIDE 10 MG/ML (PF) SYRINGE
PREFILLED_SYRINGE | INTRAVENOUS | Status: DC | PRN
  Administered 2020-09-06: 50 mg via INTRAVENOUS
  Administered 2020-09-06: 10 mg via INTRAVENOUS

## 2020-09-06 MED ORDER — LACTATED RINGERS IV SOLN: INTRAVENOUS | Status: DC

## 2020-09-06 MED ORDER — IBUPROFEN 200 MG PO TABS
600.0000 mg | ORAL_TABLET | Freq: Four times a day (QID) | ORAL | Status: DC
  Administered 2020-09-06 – 2020-09-07 (×3): 600 mg via ORAL

## 2020-09-06 MED ORDER — DROPERIDOL 2.5 MG/ML IJ SOLN: 0.6250 mg | Freq: Once | INTRAMUSCULAR | Status: DC | PRN

## 2020-09-06 MED ORDER — PROPOFOL 10 MG/ML IV BOLUS
INTRAVENOUS | Status: DC | PRN
  Administered 2020-09-06: 120 mg via INTRAVENOUS

## 2020-09-06 MED ORDER — SENNA 8.6 MG PO TABS
1.0000 | ORAL_TABLET | Freq: Two times a day (BID) | ORAL | Status: DC
  Administered 2020-09-06 (×2): 8.6 mg via ORAL

## 2020-09-06 MED ORDER — CLINDAMYCIN PHOSPHATE 900 MG/50ML IV SOLN
INTRAVENOUS | Status: AC
  Filled 2020-09-06: qty 50

## 2020-09-06 MED ORDER — MIDAZOLAM HCL 2 MG/2ML IJ SOLN
INTRAMUSCULAR | Status: AC
  Filled 2020-09-06: qty 2

## 2020-09-06 MED ORDER — HYDROMORPHONE HCL 2 MG PO TABS
2.0000 mg | ORAL_TABLET | ORAL | Status: DC | PRN
  Administered 2020-09-06 – 2020-09-07 (×4): 2 mg via ORAL

## 2020-09-06 MED ORDER — SCOPOLAMINE 1 MG/3DAYS TD PT72
MEDICATED_PATCH | TRANSDERMAL | Status: AC
  Filled 2020-09-06: qty 1

## 2020-09-06 MED ORDER — MENTHOL 3 MG MT LOZG: 1.0000 | LOZENGE | OROMUCOSAL | Status: DC | PRN

## 2020-09-06 MED ORDER — SUGAMMADEX SODIUM 200 MG/2ML IV SOLN
INTRAVENOUS | Status: DC | PRN
  Administered 2020-09-06: 400 mg via INTRAVENOUS

## 2020-09-06 MED ORDER — PANTOPRAZOLE SODIUM 40 MG PO TBEC
40.0000 mg | DELAYED_RELEASE_TABLET | Freq: Every day | ORAL | Status: DC
  Administered 2020-09-06: 40 mg via ORAL

## 2020-09-06 MED ORDER — MIDAZOLAM HCL 2 MG/2ML IJ SOLN
INTRAMUSCULAR | Status: DC | PRN
  Administered 2020-09-06: 2 mg via INTRAVENOUS

## 2020-09-06 MED ORDER — ONDANSETRON HCL 4 MG/2ML IJ SOLN: 4.0000 mg | Freq: Four times a day (QID) | INTRAMUSCULAR | Status: DC | PRN

## 2020-09-06 MED ORDER — SODIUM CHLORIDE 0.9 % IV SOLN
INTRAVENOUS | Status: DC | PRN
  Administered 2020-09-06: 110 mL

## 2020-09-06 MED ORDER — LIDOCAINE 2% (20 MG/ML) 5 ML SYRINGE
INTRAMUSCULAR | Status: AC
  Filled 2020-09-06: qty 5

## 2020-09-06 MED ORDER — IBUPROFEN 200 MG PO TABS
ORAL_TABLET | ORAL | Status: AC
  Filled 2020-09-06: qty 3

## 2020-09-06 MED ORDER — CIPROFLOXACIN IN D5W 400 MG/200ML IV SOLN
400.0000 mg | INTRAVENOUS | Status: AC
  Administered 2020-09-06: 400 mg via INTRAVENOUS

## 2020-09-06 MED ORDER — DEXAMETHASONE SODIUM PHOSPHATE 10 MG/ML IJ SOLN
INTRAMUSCULAR | Status: DC | PRN
  Administered 2020-09-06: 10 mg via INTRAVENOUS

## 2020-09-06 SURGICAL SUPPLY — 59 items
ADH SKN CLS APL DERMABOND .7 (GAUZE/BANDAGES/DRESSINGS) ×1
APL SRG 38 LTWT LNG FL B (MISCELLANEOUS) ×1
APPLICATOR ARISTA FLEXITIP XL (MISCELLANEOUS) ×2 IMPLANT
BAG DECANTER FOR FLEXI CONT (MISCELLANEOUS) ×2 IMPLANT
CATH FOLEY 3WAY  5CC 16FR (CATHETERS) ×1
CATH FOLEY 3WAY 5CC 16FR (CATHETERS) ×1 IMPLANT
COVER BACK TABLE 60X90IN (DRAPES) ×2 IMPLANT
COVER TIP SHEARS 8 DVNC (MISCELLANEOUS) ×1 IMPLANT
COVER TIP SHEARS 8MM DA VINCI (MISCELLANEOUS) ×1
DECANTER SPIKE VIAL GLASS SM (MISCELLANEOUS) ×6 IMPLANT
DEFOGGER SCOPE WARMER CLEARIFY (MISCELLANEOUS) ×2 IMPLANT
DERMABOND ADVANCED (GAUZE/BANDAGES/DRESSINGS) ×1
DERMABOND ADVANCED .7 DNX12 (GAUZE/BANDAGES/DRESSINGS) ×1 IMPLANT
DILATOR CANAL MILEX (MISCELLANEOUS) ×2 IMPLANT
DRAPE ARM DVNC X/XI (DISPOSABLE) ×4 IMPLANT
DRAPE COLUMN DVNC XI (DISPOSABLE) ×1 IMPLANT
DRAPE DA VINCI XI ARM (DISPOSABLE) ×8
DRAPE DA VINCI XI COLUMN (DISPOSABLE) ×2
DRAPE UTILITY 15X26 TOWEL STRL (DRAPES) ×2 IMPLANT
DURAPREP 26ML APPLICATOR (WOUND CARE) ×4 IMPLANT
ELECT REM PT RETURN 9FT ADLT (ELECTROSURGICAL) ×2
ELECTRODE REM PT RTRN 9FT ADLT (ELECTROSURGICAL) ×1 IMPLANT
GLOVE SURG ENC MOIS LTX SZ6.5 (GLOVE) ×2 IMPLANT
GLOVE SURG UNDER POLY LF SZ6.5 (GLOVE) ×8 IMPLANT
GLOVE SURG UNDER POLY LF SZ7 (GLOVE) ×12 IMPLANT
HEMOSTAT ARISTA ABSORB 3G PWDR (HEMOSTASIS) ×2 IMPLANT
IRRIG SUCT STRYKERFLOW 2 WTIP (MISCELLANEOUS) ×2
IRRIGATION SUCT STRKRFLW 2 WTP (MISCELLANEOUS) ×1 IMPLANT
IV NS 1000ML (IV SOLUTION) ×2
IV NS 1000ML BAXH (IV SOLUTION) ×1 IMPLANT
KIT TURNOVER CYSTO (KITS) ×2 IMPLANT
LEGGING LITHOTOMY PAIR STRL (DRAPES) ×2 IMPLANT
Latex Biogel M ×6 IMPLANT
OBTURATOR OPTICAL STANDARD 8MM (TROCAR) ×1
OBTURATOR OPTICAL STND 8 DVNC (TROCAR) ×1
OBTURATOR OPTICALSTD 8 DVNC (TROCAR) ×1 IMPLANT
OCCLUDER COLPOPNEUMO (BALLOONS) ×2 IMPLANT
PACK ROBOT WH (CUSTOM PROCEDURE TRAY) ×2 IMPLANT
PACK ROBOTIC GOWN (GOWN DISPOSABLE) ×2 IMPLANT
PACK TRENDGUARD 450 HYBRID PRO (MISCELLANEOUS) ×1 IMPLANT
PAD OB MATERNITY 4.3X12.25 (PERSONAL CARE ITEMS) ×2 IMPLANT
PAD PREP 24X48 CUFFED NSTRL (MISCELLANEOUS) ×2 IMPLANT
PROTECTOR NERVE ULNAR (MISCELLANEOUS) ×2 IMPLANT
SCISSORS LAP 5X45 EPIX DISP (ENDOMECHANICALS) ×2 IMPLANT
SEAL CANN UNIV 5-8 DVNC XI (MISCELLANEOUS) ×4 IMPLANT
SEAL XI 5MM-8MM UNIVERSAL (MISCELLANEOUS) ×4
SEALER VESSEL DA VINCI XI (MISCELLANEOUS) ×2
SEALER VESSEL EXT DVNC XI (MISCELLANEOUS) ×1 IMPLANT
SET TRI-LUMEN FLTR TB AIRSEAL (TUBING) ×2 IMPLANT
SUT VIC AB 0 CT1 27 (SUTURE) ×4
SUT VIC AB 0 CT1 27XBRD ANBCTR (SUTURE) ×2 IMPLANT
SUT VICRYL RAPIDE 4/0 PS 2 (SUTURE) ×4 IMPLANT
SUT VLOC 180 0 9IN  GS21 (SUTURE) ×1
SUT VLOC 180 0 9IN GS21 (SUTURE) ×1 IMPLANT
TIP UTERINE 6.7X8CM BLUE DISP (MISCELLANEOUS) ×2 IMPLANT
TOWEL OR 17X26 10 PK STRL BLUE (TOWEL DISPOSABLE) ×2 IMPLANT
TRENDGUARD 450 HYBRID PRO PACK (MISCELLANEOUS) ×2
TROCAR PORT AIRSEAL 8X120 (TROCAR) ×2 IMPLANT
WATER STERILE IRR 1000ML POUR (IV SOLUTION) ×2 IMPLANT

## 2020-09-06 NOTE — Transfer of Care (Signed)
Immediate Anesthesia Transfer of Care Note  Patient: Eisley Barber Starkel  Procedure(s) Performed: Procedure(s) (LRB): XI ROBOTIC ASSISTED LAPAROSCOPIC HYSTERECTOMY AND BILATERAL SALPING-OOPHERECTOMY. (Bilateral)  Patient Location: PACU  Anesthesia Type: General  Level of Consciousness: awake, oriented, sedated and patient cooperative  Airway & Oxygen Therapy: Patient Spontanous Breathing and Patient connected to face mask oxygen  Post-op Assessment: Report given to PACU RN and Post -op Vital signs reviewed and stable  Post vital signs: Reviewed and stable  Complications: No apparent anesthesia complications  Last Vitals:  Vitals Value Taken Time  BP 140/93 09/06/20 1352  Temp    Pulse 96 09/06/20 1351  Resp 16 09/06/20 1354  SpO2 100 % 09/06/20 1351  Vitals shown include unvalidated device data.  Last Pain:  Vitals:   09/06/20 0952  TempSrc: Oral  PainSc: 0-No pain      Patients Stated Pain Goal: 5 (58/59/29 2446)  Complications: No complications documented.

## 2020-09-06 NOTE — Anesthesia Procedure Notes (Signed)
Procedure Name: Intubation Date/Time: 09/06/2020 11:54 AM Performed by: Suan Halter, CRNA Pre-anesthesia Checklist: Patient identified, Emergency Drugs available, Suction available and Patient being monitored Patient Re-evaluated:Patient Re-evaluated prior to induction Oxygen Delivery Method: Circle system utilized Preoxygenation: Pre-oxygenation with 100% oxygen Induction Type: IV induction Ventilation: Mask ventilation without difficulty Laryngoscope Size: Miller and 2 Grade View: Grade I Tube type: Oral Tube size: 7.0 mm Number of attempts: 1 Airway Equipment and Method: Stylet and Oral airway Placement Confirmation: ETT inserted through vocal cords under direct vision,  positive ETCO2 and breath sounds checked- equal and bilateral Secured at: 20 cm Tube secured with: Tape Dental Injury: Teeth and Oropharynx as per pre-operative assessment

## 2020-09-06 NOTE — Op Note (Signed)
09/06/2020  1:52 PM  PATIENT:  Donna Norton  55 y.o. female  PRE-OPERATIVE DIAGNOSIS:  Fibroids (D21.9) Post Menopausal Bleeding (N95.0)  POST-OPERATIVE DIAGNOSIS:  Fibroids (D21.9) Post Menopausal Bleeding (N95.0)  PROCEDURE:  Procedure(s): XI ROBOTIC ASSISTED LAPAROSCOPIC HYSTERECTOMY AND BILATERAL SALPING-OOPHERECTOMY. (Bilateral)  SURGEON:  Surgeon(s) and Role:    Christophe Louis, MD - Primary  PHYSICIAN ASSISTANT:   ASSISTANTS: Gaylord Shih RNFA assisted due to  Complexity of the surgery   ANESTHESIA:   general  EBL:  25 mL   BLOOD ADMINISTERED:none  DRAINS: Urinary Catheter (Foley)   LOCAL MEDICATIONS USED:  OTHER Ropivicaine   SPECIMEN:  Source of Specimen:  Uterus cervix and bilateral fallopian tubes and ovaries   DISPOSITION OF SPECIMEN:  PATHOLOGY  COUNTS:  YES  TOURNIQUET:  * No tourniquets in log *  DICTATION: .Dragon Dictation  PLAN OF CARE: Admit for overnight observation  PATIENT DISPOSITION:  PACU - hemodynamically stable.   Delay start of Pharmacological VTE agent (>24hrs) due to surgical blood loss or risk of bleeding: not applicable   Findings: Enlarged fibroid uterus normal appearing fallopian tubes and ovaries. .   Procedure: The patient was taken to the operating room where she was placed under general anesthesia.Time out was performed. Marland Kitchen She was placed in dorsal lithotomy position and prepped and draped in the usual sterile fashion. A weighted speculum was placed into the vagina. A Deaver was placed anteriorly for retraction. The anterior lip of the cervix was grasped with a single-tooth tenaculum. The vaginal mucosa was injected with 2.5 cc of ropivacaine at the 2/4/ 8 and 10 o'clock positions. The uterus was sounded to 8 cm. the cervix was dilated to 6 mm . 0 vicryl suture placed at the 12 and 6:00 positions Of the cervix to facilitate placement of a Ru mi uterine manipulator. The manipulator was placed without difficulty. Weighted speculum  and Deaver were removed .  Attention was turned to the patient's abdomen where a 8 mm trocar was placed 2 cm above the umbilicus. under direct visualization . The pneumoperitoneum was achieved with PCO2 gas. The laparoscope was removed. 60 cc of ropivacaine were injected into the abdominal cavity. The laparoscope was reinserted. An 8 mm trocar was placed in the right upper quadrant 16 centimeters from the umbilicus.later connected to robotic arm #4). An 8MM incision was made in the Right upper quadrant TROCAR WAS PLACED 8 cm from the umbilicus. Later connected to robotic arm #3. An 8 mm incision was made in the left upper quadrant 16 cm from the umbilicus and connected to robot arm #1. Marland Kitchen Attention was turned to the left upper quadrant where a 8 mm midclavicular assistant trocar was placed. ( All incision sites were injected with 10cc of ropivacaine prior to port placement. )  Once all ports had been placed under direct visualization.The laparoscope was removed and the [place] robotic system was thin right-sided docked. The robotic arms were connected to the corresponding trocars as listed above. The laparoscope was then reinserted. The long tip bipolar forceps were placed into port #1. The Prograsp  placed in the port #4. A vessel sealer ( alternating with scissors ) was placed in port #3. All instruments were directed into the pelvis under direct visualization.  Attention was turned to the surgeons console.. The left ureter was identified and the left infundibulopelvic ligament was cauterized and transected with the vessel sealer The broad ligament was cauterized and transected with the vessel sealer .The round ligament  was cauterized and transected with the vessel sealer  The anterior leaf of broad ligament was incised along the bladder reflection to the midline.  The right ureter was identified and the right infundibulopelvic  ligament was cauterized and transected with the vessel sealer. The right broad  ligament was cauterized and transected with the vessel sealer. The right round ligament was cauterized and transected with the vessel sealer The broad ligament was incised to the midline. The bladder was dissected off the lower uterine segments of the cervix via sharp and blunt dissection.   The uterine arteries were skeleton bilaterally. They were  cauterized and transected with the vessel sealer The KOH ring was identified. The anterior colpotomy was performed followed by the posterior colpotomy. Once the uterus,cervix and bilateral fallopian tubes were completely excised was removed through the vagina. The  bipolar forceps and scissors were removed and log tip forceps were placed in the port #1 and the needle driver was placed in to port #3.   The vaginal cuff was closed with running suture if 0 v-lock. The pelvis was irrigated. Marland KitchenMarland KitchenMarland KitchenExcellent hemostasis was noted. Arista was placed along the vaginal cuff.  All pelvic pedicles were examined and hemostasis was noted.  All instruments removed from the ports. All ports were removed under direct Visualization. The pneumoperitoneum was released. The skin incisions were closed with 4-0 Vicryl and then covered with Derma bond.     Sponge lap and needle counts weIre correct x. The patient was awakened from anesthesia and taken to the recovery room in stable condition.

## 2020-09-06 NOTE — H&P (Signed)
Date of Initial H&P:09/05/2020 History reviewed, patient examined, no change in status, stable for surgery. 

## 2020-09-06 NOTE — Anesthesia Preprocedure Evaluation (Addendum)
Anesthesia Evaluation  Patient identified by MRN, date of birth, ID band Patient awake    Reviewed: Allergy & Precautions, NPO status , Patient's Chart, lab work & pertinent test results  History of Anesthesia Complications (+) POST - OP SPINAL HEADACHE and history of anesthetic complications  Airway Mallampati: II  TM Distance: >3 FB Neck ROM: Full    Dental  (+) Dental Advisory Given, Teeth Intact   Pulmonary shortness of breath, pneumonia,    Pulmonary exam normal breath sounds clear to auscultation       Cardiovascular hypertension, Pt. on medications Normal cardiovascular exam Rhythm:Regular Rate:Normal     Neuro/Psych  Headaches, PSYCHIATRIC DISORDERS Anxiety  Neuromuscular disease    GI/Hepatic Neg liver ROS, GERD  ,  Endo/Other  negative endocrine ROS  Renal/GU negative Renal ROS     Musculoskeletal  (+) Arthritis , Fibromyalgia -  Abdominal (+) + obese,   Peds  Hematology negative hematology ROS (+)   Anesthesia Other Findings   Reproductive/Obstetrics                            Anesthesia Physical  Anesthesia Plan  ASA: III  Anesthesia Plan: General   Post-op Pain Management:    Induction: Intravenous  PONV Risk Score and Plan: 4 or greater and Dexamethasone, Ondansetron, Midazolam, Treatment may vary due to age or medical condition and Diphenhydramine  Airway Management Planned: Oral ETT  Additional Equipment: None  Intra-op Plan:   Post-operative Plan: Extubation in OR  Informed Consent: I have reviewed the patients History and Physical, chart, labs and discussed the procedure including the risks, benefits and alternatives for the proposed anesthesia with the patient or authorized representative who has indicated his/her understanding and acceptance.     Dental advisory given  Plan Discussed with: CRNA  Anesthesia Plan Comments:        Anesthesia  Quick Evaluation

## 2020-09-07 ENCOUNTER — Encounter (HOSPITAL_BASED_OUTPATIENT_CLINIC_OR_DEPARTMENT_OTHER): Payer: Self-pay | Admitting: Obstetrics and Gynecology

## 2020-09-07 DIAGNOSIS — D259 Leiomyoma of uterus, unspecified: Secondary | ICD-10-CM | POA: Diagnosis not present

## 2020-09-07 LAB — CBC
HCT: 37.5 % (ref 36.0–46.0)
Hemoglobin: 12.5 g/dL (ref 12.0–15.0)
MCH: 31.1 pg (ref 26.0–34.0)
MCHC: 33.3 g/dL (ref 30.0–36.0)
MCV: 93.3 fL (ref 80.0–100.0)
Platelets: 325 10*3/uL (ref 150–400)
RBC: 4.02 MIL/uL (ref 3.87–5.11)
RDW: 13.2 % (ref 11.5–15.5)
WBC: 13.6 10*3/uL — ABNORMAL HIGH (ref 4.0–10.5)
nRBC: 0 % (ref 0.0–0.2)

## 2020-09-07 MED ORDER — ACETAMINOPHEN 500 MG PO TABS
ORAL_TABLET | ORAL | Status: AC
  Filled 2020-09-07: qty 2

## 2020-09-07 MED ORDER — IBUPROFEN 200 MG PO TABS
ORAL_TABLET | ORAL | Status: AC
  Filled 2020-09-07: qty 3

## 2020-09-07 MED ORDER — HYDROMORPHONE HCL 2 MG PO TABS
ORAL_TABLET | ORAL | Status: AC
  Filled 2020-09-07: qty 1

## 2020-09-07 MED ORDER — HYDROMORPHONE HCL 2 MG PO TABS: 2.0000 mg | ORAL_TABLET | ORAL | 0 refills | Status: AC | PRN

## 2020-09-07 MED ORDER — IBUPROFEN 800 MG PO TABS: 800.0000 mg | ORAL_TABLET | Freq: Three times a day (TID) | ORAL | 0 refills | Status: DC | PRN

## 2020-09-07 NOTE — Discharge Summary (Signed)
Physician Discharge Summary  Patient ID: Donna Norton MRN: [id number] DOB/AGE: [birth year] 55 y.o.  Admit date: 09/06/2020 Discharge date: 09/07/2020  Admission Diagnoses:Postmenopausal bleeding/ Uterine fibroids   Discharge Diagnoses:  Active Problems:   Postmenopausal bleeding   S/P hysterectomy with oophorectomy   Discharged Condition: stable  Hospital Course: pt was admitted for observation after undergoing robotic assisted laparoscopic hysterectomy with bilateral salpingo-oophorectomy . She did well post operatively with return of bowel and bladder function   Consults: None  Significant Diagnostic Studies: HGB postop day 1 12.5   Treatments: surgery: robotic assisted laparoscopic hysterectomy with bilateral salpingo-oophorectomy  Discharge Exam: Blood pressure 123/70, pulse 62, temperature 98 F (36.7 C), resp. rate 15, height 5\' 3"  (1.6 m), weight 95.3 kg, last menstrual period 04/23/2009, SpO2 100 %. General appearance: alert, cooperative and no distress GI: soft appropriately tender nondistended  Extremities: extremities normal, atraumatic, no cyanosis or edema Skin: Skin color, texture, turgor normal. No rashes or lesions Incision/Wound:well approximated no erythema or exudate  Disposition: Discharge disposition: 01-Home or Self Care       Discharge Instructions    Call MD for:  persistant nausea and vomiting   Complete by: As directed    Call MD for:  redness, tenderness, or signs of infection (pain, swelling, redness, odor or green/yellow discharge around incision site)   Complete by: As directed    Call MD for:  severe uncontrolled pain   Complete by: As directed    Call MD for:  temperature >100.4   Complete by: As directed    Diet general   Complete by: As directed    Driving Restrictions   Complete by: As directed    Avoid driving for 1 week   Increase activity slowly   Complete by: As directed    Lifting restrictions   Complete by: As  directed    Avoid lifting over 10 lbs   May shower / Bathe   Complete by: As directed    May walk up steps   Complete by: As directed    No wound care   Complete by: As directed    Sexual Activity Restrictions   Complete by: As directed    Avoid sexual activity     Allergies as of 09/07/2020      Reactions   Atorvastatin    Other reaction(s): shoulder pain   Ceclor [cefaclor] Hives   Duloxetine    Other reaction(s): pulling her down" and decreased appetitie, MADE SUICIDAL   Hydrocodone Bit-homatrop Mbr    Other reaction(s): headaches/ n/v   Methimazole Other (See Comments)   Liver ENZYMES RASIED   Percocet [oxycodone-acetaminophen] Hives   CAN TAKE OXYCOONE CANNOT TAKE PERCOCET   Trazodone Hcl    Other reaction(s): dizziness      Medication List    STOP taking these medications   tranexamic acid 650 MG Tabs tablet Commonly known as: LYSTEDA   UNABLE TO FIND     TAKE these medications   acetaminophen 500 MG tablet Commonly known as: TYLENOL Take 2 tablets (1,000 mg total) by mouth every 6 (six) hours as needed.   HYDROmorphone 2 MG tablet Commonly known as: DILAUDID Take 1 tablet (2 mg total) by mouth every 4 (four) hours as needed for up to 7 days for severe pain ((when tolerating fluids)).   ibuprofen 800 MG tablet Commonly known as: ADVIL Take 1 tablet (800 mg total) by mouth every 8 (eight) hours as needed for mild pain, moderate pain or cramping.  What changed: reasons to take this   multivitamin with minerals tablet Take 1 tablet by mouth daily.       Follow-up Information    Christophe Louis, MD. Go in 2 week(s).   Specialty: Obstetrics and Gynecology Why: postoperative visit  Contact information: [address] [place] [address] [phone]               Signed: Christophe Louis 09/07/2020, 11:42 AM

## 2020-09-07 NOTE — Anesthesia Postprocedure Evaluation (Signed)
Anesthesia Post Note  Patient: Donna Norton  Procedure(s) Performed: XI ROBOTIC ASSISTED LAPAROSCOPIC HYSTERECTOMY AND BILATERAL SALPING-OOPHERECTOMY. (Bilateral Abdomen)     Patient location during evaluation: PACU Anesthesia Type: General Level of consciousness: sedated and patient cooperative Pain management: pain level controlled Vital Signs Assessment: post-procedure vital signs reviewed and stable Respiratory status: spontaneous breathing Cardiovascular status: stable Anesthetic complications: no   No complications documented.  Last Vitals:  Vitals:   09/07/20 0200 09/07/20 0556  BP: 120/67 125/73  Pulse: 62 65  Resp: 15 13  Temp: 36.5 C 36.7 C  SpO2: 98% 98%    Last Pain:  Vitals:   09/07/20 0804  TempSrc:   PainSc: [place]

## 2020-09-08 LAB — SURGICAL PATHOLOGY

## 2020-09-17 NOTE — H&P (Signed)
Signed       Subjective: Chief Complaint(s):    Preop/ postmenopausal bleeding and fibroids  HPI:  Isolation Precautions Yes- Pfizer" label="Has patient received COVID-19 vaccination?" propId="[id number]" catId="[id number]" encId="[id number]"Has patient received COVID-19 vaccination? 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 Does patient report new onset of COVID symptoms? No. Has patient or close contact tested positive for COVID-19? No , not in the past 2 weeks.  General 55 yo presents for pre-op visit. Pt is scheduled for robotic assisted laparoscopic hysterectomy w/ bilateral salpingo-oophorectomy on Sep 06, 2020 for management of PMB and AUB. H/o hysteroscopy/D&C w/ polypectomy at [facility] on October 14, 2019. Pathology revealed benign endometrial polyp. No hyperplasia or malignancy seen. Pt seen Mar 31, 2020 c/o light bleeding and RT sided pelvic pain for 2 wks. Endorsed yellow-ish discharge for 1 mo w/ "chemical smell". Denied N/V/D, dysuria, constipation. Pelvic exam revealed scant amt of blood in vaginal vault and RT adnexal tenderness w/ no masses. Ibuprofen 800 MG prescribed for pain. No evidence of UTI on urinalysis. Wet mount negative. Discussion of hysterectomy deferred for next visit. Pt seen May 16, 2020 c/o unresolved pelvic pain. She reported no spotting for over 1 week. Expressed interest in definitive therapy w/ hysterectomy. Plan for EMB if bleeding returned.  U/S on May 16, 2020 revealed uterus measuring 10.1 x 5.9 x 8.3 cm. 4 fibroids were noted w/ the largest measuring 4.2 cm. Endometrium thickened at 9.8 mm and irregular in appearance. Bilat OV WNL. EMB on Jul 15, 2020 revealed no cervical dysplasia or malignancy. Pt seen Jul 21, 2020 c/o light spotting that progressed to heavier bleeding and clotting since EMB on Jul 15, 2020 and blood when urinating. She reported using regular sized pads since then. Pelvic exam revealed normal. HGB 14.4, WNL. Provera 10 MG  prescribed. Pt seen Aug 08, 2020 c/o heavy spotting/bleeding after stopping Provera following swelling and pain. She reported changing her pad every 20 minutes, abdominal cramping. Endorsed dizziness, lightheadedness, and intermittent SOB. Advil provided some relief. Pelvic exam revealed 12 wk size uterus and large amt of blood in vaginal vault. CBC w/o diff WNL. Tranexamic 650 MG prescribed for bleeding and Ibuprofen 800 MG prescribed for pain. U/S on Aug 08, 2020 revealed uterus measuring 11.0 x 6.2 x 8.1 cm. Endometrium thickened at 1.06 cm and irregular in appearance. 4 fibroids measured w/ the largest fundal and measuring 4.7 cm. All fibroids stable in size from prev U/S in Jan 2022. Bilat OV WNL. Pt went to ED on Aug 10, 2020 due to worsening bleeding, SOB, nausea, and extreme weakness and fatigue. She was diagnosed w/ COVID. Pt was advised COVID may have disrupted her bleeding patterns. Aygestin 5 MG prescribed which improved her bleeding. Today, pt reports she has no bleeding in office. Last week, pt had some swelling, though it resolved on its own. She has some occasional, infrequent discharge, not much of a concern. She is on Aygestin BID. 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 She is currently taking Ibuprofen for pain management, though mostly for her shoulder. Pt has recovered from [place]. She states she has some mild SOB and mild cough w/ weather changes, though she also endorses a h/o rhinitis and seasonal allergies. She believes she contracted COVID on Easter Sunday.  Pelvic exam revealed 12 week size uterus, otherwise normal. Current Medication: Taking   Norethindrone Acetate 5 MG Tablet TAKE 1 TABLET BY MOUTH TWICE A DAY.     Ibuprofen 800 MG Tablet 1  tablet with food Orally Every 8 hours as needed.  Not-Taking   Tranexamic Acid 650 MG Tablet 2 tablet Orally 3 times a day.     valACYclovir HCl 500 MG Tablet TAKE 1 TABLET BY MOUTH EVERY 12 HOURS.      diazePAM 5 MG Tablet 1/2 tablet at bedtime for anxiety/rest.     Rosuvastatin Calcium 5 MG Tablet 1 tablet Orally Once a day, Notes: 1/2.     Multivitamin Adult - Tablet as directed Orally.  Discontinued   Ibuprofen 800 MG Tablet 1 tablet with food or milk as needed Orally every 8 hrs as needed for pain, Notes: very rarely.     Medication List reviewed and reconciled with the patient. Medical History:   Hyperthyroidism (ablation)/ Hypothyroidism     Osteoarthritis     Fibromyalgia     h/o kidney stones     h/o hypertension     Depression (h/o dysthymia)     High cholesterol (did not tolerate Atorvastatin)     COVID-19 (2022)     Allergies/Intolerance: Methimazole: Side Effects - elevated liver enz Hycodan - headaches/ n/v Atorvastatin Calcium - shoulder pain Trazodone HCl: Side Effects - dizziness Duloxetine: Side Effects - diarrhea Gyn History:   Sexual activity not currently sexually active. Denies Periods : postmenopausal- PMB last week . LMP 10/2010. Denies Birth control none. Last pap smear date 01/26/2020 - neg. Last mammogram date 10/02/2019-negative. Denies Abnormal pap smear. H/O STD Herpes. GYN procedures D/C.  OB History:   Number of pregnancies 3. Pregnancy # 1 live birth, vaginal delivery, girl. Pregnancy # 2 live birth, vaginal delivery, boy. Pregnancy # 3 live birth, vaginal delivery, girl.  Surgical History:   D/C, polypectomy 10/14/2019     colonoscopy 2022  Hospitalization:   Childbirth x 3     Meningitis     Not in the past year 05/2020  Family History:   Father: alive 74 yrs, diagnosed with Diabetes, Coronary artery disease    Mother: alive 49 yrs, diagnosed with Hypertension    Brother 1: alive    Brother2: alive    Brother 3: alive    Sister 1: alive, fibromyalgia, Crohns disease    Sister 2: deceased 64 yrs, cervical cancer    3 brother(s) , 2  sister(s) . 1 son(s) , 2 daughter(s) .    No family hx of colon cancer/polyps or liver disease. Social History: General Tobacco use cigarettes: Never smoked, Tobacco history last updated 09/01/2020, Vaping No.  no Alcohol, 1-2 a year.  Caffeine: 1 per day.  no Recreational drug use.  Exercise: three a week (walks, exercise videos).  DENTAL CARE: lacking.  Marital Status: single, Divorced.  Children: 3.  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  COMMUNICATION BARRIERS: none.  one of her 3 children lives with her (1985: Leanna Sato). ROS: CONSTITUTIONAL No" label="Chills" value="" options="no,yes" propid="91" itemid="193425" categoryid="10464" encounterid="[id number]"Chills No. No" label="Fatigue" value="" options="no,yes" propid="91" itemid="172899" categoryid="10464" encounterid="[id number]"Fatigue No. No" label="Fever" value="" options="no,yes" propid="91" itemid="10467" categoryid="10464" encounterid="[id number]"Fever No. No" label="Night sweats" value="" options="no,yes" propid="91" itemid="193426" categoryid="10464" encounterid="[id number]"Night sweats No. No" label="Recent travel outside [place]" value="" options="no,yes" propid="91" itemid="444261" categoryid="10464" encounterid="[id number]"Recent travel outside [place] No. No" label="Sweats" value="" options="no,yes" propid="91" itemid="193427" categoryid="10464" encounterid="[id number]"Sweats No. No" label="Weight change" value="" options="no,yes" propid="91" itemid="194825" categoryid="10464" encounterid="[id number]"Weight change No.  OPHTHALMOLOGY no" label="Blurring of vision" value="" options="no,yes" propid="91" itemid="12520" categoryid="12516" encounterid="[id number]"Blurring of vision no. no" label="Change in vision" value="" options="no,yes" propid="91" itemid="193469" categoryid="12516" encounterid="[id number]"Change in vision no. no" label="Double vision" value="" options="no,yes" propid="91" itemid="194379" categoryid="12516" encounterid="[id number]"Double  vision no.  ENT no" label="Dizziness" value="" options="no,yes" propid="91" itemid="193612" categoryid="10481" encounterid="[id number]"Dizziness no. Nose bleeds no. Sore throat no. Teeth pain no.  ALLERGY no" label="Hives" value="" options="no,yes" propid="91" itemid="202589" categoryid="138152" encounterid="[id number]"Hives no.  CARDIOLOGY no" label="Chest pain" value="" options="no,yes" propid="91" itemid="193603" categoryid="10488" encounterid="[id number]"Chest pain no. no" label="High blood pressure" value="" options="no,yes" propid="91" itemid="199089" categoryid="10488" encounterid="[id number]"High blood pressure no. no" label="Irregular heart beat" value="" options="no,yes" propid="91" itemid="202598" categoryid="10488" encounterid="[id number]"Irregular heart beat no. no" label="Leg edema" value="" options="no,yes" propid="91" itemid="10491" categoryid="10488" encounterid="[id number]"Leg edema no. no" label="Palpitations" value="" options="no,yes" propid="91" itemid="10490" categoryid="10488" encounterid="[id number]"Palpitations no.  RESPIRATORY Shortness of breath YES. Cough YES. no" label="Wheezing" value="" options="no,yes" propid="91" itemid="193621" categoryid="138132" encounterid="[id number]"Wheezing no.  UROLOGY no" label="Pain with urination" value="" options="no,yes" propid="91" itemid="194377" categoryid="138166" encounterid="[id number]"Pain with urination no. no" label="Urinary urgency" value="" options="no,yes" propid="91" itemid="193493" categoryid="138166" encounterid="[id number]"Urinary urgency no. no" label="Urinary frequency" value="" options="no,yes" propid="91" itemid="193492" categoryid="138166" encounterid="[id number]"Urinary frequency no. no" label="Urinary incontinence" value="" options="no,yes" propid="91" itemid="138171" categoryid="138166" encounterid="[id number]"Urinary incontinence no. No" label="Difficulty urinating" value="" options="no,yes" propid="91" itemid="138167" categoryid="138166"  encounterid="[id number]"Difficulty urinating No. No" label="Blood in urine" value="" options="no,yes" propid="91" itemid="138168" categoryid="138166" encounterid="[id number]"Blood in urine No.  GASTROENTEROLOGY no" label="Abdominal pain" value="" options="no,yes" propid="91" itemid="10496" categoryid="10494" encounterid="[id number]"Abdominal pain no. no" label="Appetite change" value="" options="no,yes" propid="91" itemid="193447" categoryid="10494" encounterid="[id number]"Appetite change no. Bloating/belching YES. no" label="Blood in stool or on toilet paper" value="" options="no,yes" propid="91" itemid="10503" categoryid="10494" encounterid="[id number]"Blood in stool or on toilet paper no. no" label="Change in bowel movements" value="" options="no,yes" propid="91" itemid="199106" categoryid="10494" encounterid="[id number]"Change in bowel movements no. no" label="Constipation" value="" options="no,yes" propid="91" itemid="10501" categoryid="10494" encounterid="[id number]"Constipation no. no" label="Diarrhea" value="" options="no,yes" propid="91" itemid="10502" categoryid="10494" encounterid="[id number]"Diarrhea no. Difficulty swallowing YES. no" label="Nausea" value="" options="no,yes" propid="91" itemid="10499" categoryid="10494" encounterid="[id number]"Nausea no.  FEMALE REPRODUCTIVE no" label="Vulvar pain" value="" options="no,yes" propid="91" itemid="453725" categoryid="10525" encounterid="[id number]"Vulvar pain no. no" label="Vulvar rash" value="" options="no,yes" propid="91" itemid="453726" categoryid="10525" encounterid="[id number]"Vulvar rash no. Abnormal vaginal bleeding YES. Breast pain YES. no" label="Nipple discharge" value="" options="no,yes" propid="91" itemid="186084" categoryid="10525" encounterid="[id number]"Nipple discharge no. no" label="Pain with intercourse" value="" options="no,yes" propid="91" itemid="275823" categoryid="10525" encounterid="[id number]"Pain with intercourse no. no" label="Pelvic pain" value=""  options="no,yes" propid="91" itemid="186082" categoryid="10525" encounterid="[id number]"Pelvic pain no. no" label="Unusual vaginal discharge" value="" options="no,yes" propid="91" itemid="278230" categoryid="10525" encounterid="[id number]"Unusual vaginal discharge no. no" label="Vaginal itching" value="" options="no,yes" propid="91" itemid="278942" categoryid="10525" encounterid="[id number]"Vaginal itching no.  MUSCULOSKELETAL no" label="Muscle aches" value="" options="no,yes" propid="91" itemid="193461" categoryid="10514" encounterid="[id number]"Muscle aches no.  NEUROLOGY no" label="Headache" value="" options="no,yes" propid="91" itemid="12513" categoryid="12512" encounterid="[id number]"Headache no. no" label="Tingling/numbness" value="" options="no,yes" propid="91" itemid="12514" categoryid="12512" encounterid="[id number]"Tingling/numbness no. no" label="Weakness" value="" options="no,yes" propid="91" itemid="193468" categoryid="12512" encounterid="[id number]"Weakness no.  PSYCHOLOGY no" label="Depression" value="" options="" propid="91" itemid="275919" categoryid="10520" encounterid="[id number]"Depression no. no" label="Anxiety" value="" options="no,yes" propid="91" itemid="172748" categoryid="10520" encounterid="[id number]"Anxiety no. no" label="Nervousness" value="" options="no,yes" propid="91" itemid="199158" categoryid="10520" encounterid="[id number]"Nervousness no. no" label="Sleep disturbances" value="" options="no,yes" propid="91" itemid="12502" categoryid="10520" encounterid="[id number]"Sleep disturbances no. no " label="Suicidal ideation" value="" options="no,yes" propid="91" itemid="72718" categoryid="10520" encounterid="[id number]"Suicidal ideation no .  ENDOCRINOLOGY no" label="Excessive thirst" value="" options="no,yes" propid="91" itemid="194628" categoryid="12508" encounterid="[id number]"Excessive thirst no. no" label="Excessive urination" value="" options="no,yes" propid="91" itemid="196285" categoryid="12508"  encounterid="[id number]"Excessive urination no. no" label="Hair loss" value="" options="no, yes" propid="91" itemid="444314" categoryid="12508" encounterid="[id number]"Hair loss no. no" label="Heat or cold intolerance" value="" options="" propid="91" itemid="447284" categoryid="12508" encounterid="[id number]"Heat or cold intolerance no.  HEMATOLOGY/LYMPH no" label="Abnormal bleeding" value="" options="no,yes" propid="91" itemid="199152" categoryid="138157" encounterid="[id number]"Abnormal bleeding no. no" label="Easy bruising" value="" options="no,yes" propid="91" itemid="170653" categoryid="138157" encounterid="13841092"Easy bruising no. no" label="Swollen glands" value="" options="no,yes" propid="91" itemid="138158" categoryid="138157" encounterid="[id number]"Swollen glands no.  DERMATOLOGY no" label="New/changing skin lesion" value="" options="no,yes" propid="91" itemid="199126" categoryid="12503" encounterid="[id number]"New/changing skin lesion no. no" label="Rash" value="" options="no,yes" propid="91" itemid="12504" categoryid="12503" encounterid="[id number]"Rash no. no" label="Sores" value="" options="" propid="91" itemid="444313" categoryid="12503" encounterid="[id number]"Sores no.  Negative except as stated in HPI.  Objective: Vitals:  Wt 214.2, Wt change .6 lb, Ht 62, BMI 39.17, Pulse  sitting 80, BP sitting 124/86. Past Results: Examination:  General Examination alert, oriented, NAD " label="CONSTITUTIONAL:" categoryPropId="10089" examid="193638"CONSTITUTIONAL: alert, oriented, NAD .  moist, warm" label="SKIN:" categoryPropId="10109" examid="193638"SKIN: moist, warm.  Conjunctiva clear" label="EYES:" categoryPropId="21468" examid="193638"EYES: Conjunctiva clear.  LUNGS: good I:E efffort noted, CTA bilat.  HEART: RRR.  soft, non-tender/non-distended, bowel sounds present " label="ABDOMEN:" categoryPropId="88" examid="193638"ABDOMEN: soft, non-tender/non-distended, bowel sounds present .  FEMALE  GENITOURINARY: normal external genitalia, labia - unremarkable, vagina - pink moist mucosa, no lesions or abnormal discharge, cervix - no discharge or lesions or CMT, adnexa - no masses or tenderness, uterus - nontender, 12 week size uterus.  affect normal, good eye contact" label="PSYCH:" categoryPropId="16316" examid="193638"PSYCH: affect normal, good eye contact.  Physical Examination: Chaperone present [facility] 09/01/2020 12:26:26 PM > , for pelvic exam" label="Chaperone present" itemId="[id number]" categoryId="[id number]"Chaperone present [facility] 09/01/2020 12:26:26 PM > , for pelvic exam.  Pt aware of scribe services today.   Assessment: Assessment:   Postmenopausal bleeding - N95.0 (Primary)     Fibroids - D25.9    Plan: Treatment: Postmenopausal bleeding Notes: Pt is scheduled for robotic assisted laparoscopic hysterectomy w/ bilateral salpingo-oophorectomy on Sep 06, 2020 for management of PMB and AUB. Pt advised she can go home the same day, unless converted to open incision, in which case she will stay 2 nights. She is advised that in order to be discharged from hospital, she will need to be able to ambulate, urinate, flatulate, tolerate food by mouth, and take pain medication by mouth. Discussed risks of hysterectomy including but not limited to infection, bleeding, conversion to open incision, damage to her bowel, bladder, or ureters, with the need for further surgery. Discussed risk of blood transfusion and risk of HIV or hep B&C with blood transfusion. Pt is aware of risks and desires blood transfusion if needed. Pt advised to avoid NSAIDs (Aspirin, Aleve, Advil, Ibuprofen, Motrin) from now until surgery given risk of bleeding during surgery. She may take Tylenol for pain management. She is advised to avoid eating or drinking starting midnight prior to surgery. Discussed post-surgery avoidance of driving for 1 week w/ robotic hysterectomy (or 2 weeks with open incision) and  avoidance of lifting weight greater than 10 lbs or intercourse for 6-8 weeks after procedure. Follow up in 3 weeks for 2 wk post-op visit. Fibroids Notes: Pt is scheduled for robotic assisted laparoscopic hysterectomy w/ bilateral salpingo-oophorectomy on Sep 06, 2020 for management of PMB and AUB. Follow up in 3 weeks for 2 wk post-op visit.          Routing History    Note Details  Guillermina City, MD File Time 09/05/2020 4:39 PM Author Type Physician Status Signed Last Editor Christophe Louis, [place] # [phone] Admit Date 09/06/2020

## 2021-01-04 ENCOUNTER — Other Ambulatory Visit: Payer: Self-pay | Admitting: Internal Medicine

## 2021-01-04 DIAGNOSIS — Z1231 Encounter for screening mammogram for malignant neoplasm of breast: Secondary | ICD-10-CM

## 2021-01-10 ENCOUNTER — Telehealth: Payer: Self-pay | Admitting: Podiatry

## 2021-01-10 NOTE — Telephone Encounter (Signed)
Patient has scheduled an appointment with Dr. Matthew Folks next week and wanted to know what she can do in the mean time to alleviate the pain.

## 2021-01-19 ENCOUNTER — Other Ambulatory Visit: Payer: Self-pay

## 2021-01-19 ENCOUNTER — Ambulatory Visit (INDEPENDENT_AMBULATORY_CARE_PROVIDER_SITE_OTHER): Payer: 59 | Admitting: Podiatry

## 2021-01-19 ENCOUNTER — Ambulatory Visit (INDEPENDENT_AMBULATORY_CARE_PROVIDER_SITE_OTHER): Payer: 59

## 2021-01-19 DIAGNOSIS — M21611 Bunion of right foot: Secondary | ICD-10-CM

## 2021-01-19 DIAGNOSIS — M7751 Other enthesopathy of right foot: Secondary | ICD-10-CM

## 2021-01-19 DIAGNOSIS — M722 Plantar fascial fibromatosis: Secondary | ICD-10-CM | POA: Diagnosis not present

## 2021-01-19 DIAGNOSIS — M7750 Other enthesopathy of unspecified foot: Secondary | ICD-10-CM

## 2021-01-19 DIAGNOSIS — M2011 Hallux valgus (acquired), right foot: Secondary | ICD-10-CM | POA: Diagnosis not present

## 2021-01-19 DIAGNOSIS — M19071 Primary osteoarthritis, right ankle and foot: Secondary | ICD-10-CM

## 2021-01-19 MED ORDER — MELOXICAM 15 MG PO TABS: 15.0000 mg | ORAL_TABLET | Freq: Every day | ORAL | 3 refills | Status: DC

## 2021-01-19 NOTE — Progress Notes (Signed)
  Subjective:  Patient ID: Donna Norton, female    DOB: 1966-02-09,  MRN: [id number]  Chief Complaint  Patient presents with   Foot Pain    (np) right foot pain    56 y.o. female presents with the above complaint. History confirmed with patient.  Complains of right foot and heel pain present for about a month and a half.  Its been really bad last 2 weeks.  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  Right Foot: She has severe hallux valgus deformity and sharp pain on palpation of the medial plantar calcaneal tubercle insertion of the plantar fascia    Radiographs: Multiple views x-ray of the right foot: She has moderate plantar calcaneal heel spur as well as severe hallux valgus deformity with metatarsus primus varus and severe arthritis of the second and third MTPJ's Assessment:   1. Plantar fasciitis, right   2. Hallux valgus with bunions, right   3. Arthritis of midtarsal joint of right foot      Plan:  Patient was evaluated and treated and all questions answered.  Discussed the etiology and treatment options for plantar fasciitis including stretching, formal physical therapy, supportive shoegears such as a running shoe or sneaker, pre fabricated orthoses, injection therapy, and oral medications. We also discussed the role of surgical treatment of this for patients who do not improve after exhausting non-surgical treatment options.   -XR reviewed with patient -Educated patient on stretching and icing of the affected limb -Injection delivered to the plantar fascia of the right foot. -Rx for meloxicam. Educated on use, risks and benefits of the medication  After sterile prep with povidone-iodine solution and alcohol, the right heel was injected with 0.5cc  2% xylocaine plain, 0.5cc 0.5% marcaine plain, 5mg  triamcinolone acetonide, and 2mg  dexamethasone was injected along the medial plantar fascia at the insertion on the plantar calcaneus. The patient tolerated the procedure well without complication.   We also discussed her severe hallux valgus deformity.  She knows she is going to have to have surgery at some point she says.  She has pain mostly in the top of the midfoot.  I reviewed her x-rays with her and discussed with her she likely needs a Lapidus bunionectomy as well as tarsometatarsal fusions of the second and third TMT joints for her arthritis.  We will discuss this further once her heel is feeling better.   Return in about 1 month (around 02/18/2021) for recheck plantar fasciitis.

## 2021-01-19 NOTE — Patient Instructions (Signed)

## 2021-02-08 ENCOUNTER — Other Ambulatory Visit: Payer: Self-pay

## 2021-02-08 ENCOUNTER — Ambulatory Visit
Admission: RE | Admit: 2021-02-08 | Discharge: 2021-02-08 | Disposition: A | Discharge status: Home / Self Care | Payer: 59 | Source: Ambulatory Visit | Attending: Internal Medicine | Admitting: Internal Medicine

## 2021-02-08 DIAGNOSIS — Z1231 Encounter for screening mammogram for malignant neoplasm of breast: Secondary | ICD-10-CM

## 2021-02-21 ENCOUNTER — Ambulatory Visit: Payer: 59 | Admitting: Podiatry

## 2021-04-05 ENCOUNTER — Other Ambulatory Visit (HOSPITAL_COMMUNITY): Payer: Self-pay | Admitting: Radiology

## 2021-04-05 DIAGNOSIS — R0602 Shortness of breath: Secondary | ICD-10-CM

## 2021-07-07 ENCOUNTER — Other Ambulatory Visit: Payer: Self-pay | Admitting: Family Medicine

## 2021-07-07 DIAGNOSIS — H9319 Tinnitus, unspecified ear: Secondary | ICD-10-CM

## 2021-07-07 DIAGNOSIS — R519 Headache, unspecified: Secondary | ICD-10-CM

## 2021-07-07 DIAGNOSIS — H539 Unspecified visual disturbance: Secondary | ICD-10-CM

## 2021-07-24 ENCOUNTER — Ambulatory Visit (HOSPITAL_COMMUNITY)
Admission: RE | Admit: 2021-07-24 | Discharge: 2021-07-24 | Disposition: A | Discharge status: Home / Self Care | Payer: Commercial Managed Care - HMO | Source: Ambulatory Visit | Attending: Family Medicine | Admitting: Family Medicine

## 2021-07-24 DIAGNOSIS — R0602 Shortness of breath: Secondary | ICD-10-CM | POA: Diagnosis present

## 2021-07-24 LAB — PULMONARY FUNCTION TEST
DL/VA % pred: 133 %
DL/VA: 5.67 ml/min/mmHg/L
DLCO unc % pred: 96 %
DLCO unc: 19.08 ml/min/mmHg
FEF 25-75 Post: 2.44 L/sec
FEF 25-75 Pre: 2.21 L/sec
FEF2575-%Change-Post: 10 %
FEF2575-%Pred-Post: 117 %
FEF2575-%Pred-Pre: 106 %
FEV1-%Change-Post: 1 %
FEV1-%Pred-Post: 103 %
FEV1-%Pred-Pre: 101 %
FEV1-Post: 2.11 L
FEV1-Pre: 2.07 L
FEV1FVC-%Change-Post: 3 %
FEV1FVC-%Pred-Pre: 102 %
FEV6-%Change-Post: -1 %
FEV6-%Pred-Post: 99 %
FEV6-%Pred-Pre: 100 %
FEV6-Post: 2.51 L
FEV6-Pre: 2.53 L
FEV6FVC-%Pred-Post: 103 %
FEV6FVC-%Pred-Pre: 103 %
FVC-%Change-Post: -1 %
FVC-%Pred-Post: 96 %
FVC-%Pred-Pre: 97 %
FVC-Post: 2.51 L
FVC-Pre: 2.53 L
Post FEV1/FVC ratio: 84 %
Post FEV6/FVC ratio: 100 %
Pre FEV1/FVC ratio: 82 %
Pre FEV6/FVC Ratio: 100 %
RV % pred: 84 %
RV: 1.61 L
TLC % pred: 85 %
TLC: 4.17 L

## 2021-07-24 MED ORDER — ALBUTEROL SULFATE (2.5 MG/3ML) 0.083% IN NEBU
2.5000 mg | INHALATION_SOLUTION | Freq: Once | RESPIRATORY_TRACT | Status: AC
  Administered 2021-07-24: 2.5 mg via RESPIRATORY_TRACT

## 2021-07-25 ENCOUNTER — Ambulatory Visit
Admission: RE | Admit: 2021-07-25 | Discharge: 2021-07-25 | Disposition: A | Discharge status: Home / Self Care | Payer: Managed Care, Other (non HMO) | Source: Ambulatory Visit | Attending: Family Medicine | Admitting: Family Medicine

## 2021-07-25 DIAGNOSIS — H9319 Tinnitus, unspecified ear: Secondary | ICD-10-CM

## 2021-07-25 DIAGNOSIS — R519 Headache, unspecified: Secondary | ICD-10-CM

## 2021-07-25 DIAGNOSIS — H539 Unspecified visual disturbance: Secondary | ICD-10-CM

## 2021-07-25 MED ORDER — GADOBENATE DIMEGLUMINE 529 MG/ML IV SOLN
20.0000 mL | Freq: Once | INTRAVENOUS | Status: AC | PRN
  Administered 2021-07-25: 20 mL via INTRAVENOUS

## 2022-02-14 ENCOUNTER — Other Ambulatory Visit: Payer: Self-pay | Admitting: Internal Medicine

## 2022-02-14 DIAGNOSIS — Z1231 Encounter for screening mammogram for malignant neoplasm of breast: Secondary | ICD-10-CM

## 2022-02-17 ENCOUNTER — Ambulatory Visit (HOSPITAL_COMMUNITY)
Admission: RE | Admit: 2022-02-17 | Discharge: 2022-02-17 | Disposition: A | Discharge status: Home / Self Care | Payer: Commercial Managed Care - HMO | Source: Ambulatory Visit | Attending: Physician Assistant | Admitting: Physician Assistant

## 2022-02-17 ENCOUNTER — Encounter (HOSPITAL_COMMUNITY): Payer: Self-pay

## 2022-02-17 ENCOUNTER — Ambulatory Visit (INDEPENDENT_AMBULATORY_CARE_PROVIDER_SITE_OTHER): Payer: Commercial Managed Care - HMO

## 2022-02-17 VITALS — BP 102/72 | HR 89 | Temp 98.2°F | Resp 18

## 2022-02-17 DIAGNOSIS — R079 Chest pain, unspecified: Secondary | ICD-10-CM

## 2022-02-17 DIAGNOSIS — R0982 Postnasal drip: Secondary | ICD-10-CM

## 2022-02-17 DIAGNOSIS — R0602 Shortness of breath: Secondary | ICD-10-CM

## 2022-02-17 DIAGNOSIS — R0789 Other chest pain: Secondary | ICD-10-CM | POA: Diagnosis not present

## 2022-02-17 DIAGNOSIS — J029 Acute pharyngitis, unspecified: Secondary | ICD-10-CM

## 2022-02-17 LAB — POCT RAPID STREP A, ED / UC: Streptococcus, Group A Screen (Direct): NEGATIVE

## 2022-02-17 MED ORDER — ALBUTEROL SULFATE HFA 108 (90 BASE) MCG/ACT IN AERS: 2.0000 | INHALATION_SPRAY | Freq: Four times a day (QID) | RESPIRATORY_TRACT | 2 refills | Status: AC | PRN

## 2022-02-17 MED ORDER — FLUTICASONE PROPIONATE 50 MCG/ACT NA SUSP: 2.0000 | Freq: Every day | NASAL | 3 refills | Status: DC

## 2022-02-17 NOTE — ED Provider Notes (Signed)
[place]   MRN: [id number] DOB: Jun 03, 1965  Subjective:   Donna Norton is a 56 y.o. female presenting for initially for sore throat.  States that symptoms started yesterday where she started to feel some swelling and soreness in her throat.  Minor body aches.  Some fatigue noted.  No fever.  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  Patient also reports to me that in the last 3 weeks she has noticed some increased symptoms of shortness of breath and a pain that goes under her left breast and into her upper back.  States that it is somewhat of a sharp pain. Sometimes she has to go sit down for the pain to get better.  She does have a history of fibromyalgia, l post-COVID syndrome, chronic cough and dyspnea.  States that she does not think she has any underlying COPD or asthma, but is also concerned about this possibility.  She does not have any chest pain on exam today.  She is breathing well and does not have any shortness of breath while sitting here.  No current facility-administered medications for this encounter.  Current Outpatient Medications:    albuterol (PROAIR HFA) 108 (90 Base) MCG/ACT inhaler, Inhale 2 puffs into the lungs every 6 (six) hours as needed for wheezing or shortness of breath., Disp: 1 each, Rfl: 2   fluticasone (FLONASE) 50 MCG/ACT nasal spray, Place 2 sprays into both nostrils daily., Disp: 16 g, Rfl: 3   acetaminophen (TYLENOL) 500 MG tablet, Take 2 tablets (1,000 mg total) by mouth every 6 (six) hours as needed., Disp: 30 tablet, Rfl: 0   ibuprofen (ADVIL) 800 MG tablet, Take 1 tablet (800 mg total) by mouth every 8 (eight) hours as needed for mild pain, moderate pain or cramping., Disp: 30 tablet, Rfl: 0   meloxicam (MOBIC) 15 MG tablet, Take 1 tablet (15 mg total) by mouth daily., Disp: 30 tablet, Rfl: 3   Multiple Vitamins-Minerals (MULTIVITAMIN WITH MINERALS) tablet, Take 1 tablet  by mouth daily., Disp: , Rfl:    norethindrone (AYGESTIN) 5 MG tablet, Take by mouth., Disp: , Rfl:    rosuvastatin (CRESTOR) 5 MG tablet, Take 2.5 mg by mouth daily., Disp: , Rfl:    Allergies  Allergen Reactions   Atorvastatin     Other reaction(s): shoulder pain   Ceclor [Cefaclor] Hives   Duloxetine     Other reaction(s): pulling her down" and decreased appetitie, MADE SUICIDAL   Hydrocodone Bit-Homatrop Mbr     Other reaction(s): headaches/ n/v   Methimazole Other (See Comments)    Liver ENZYMES RASIED    Percocet [Oxycodone-Acetaminophen] Hives    CAN TAKE OXYCOONE CANNOT TAKE PERCOCET   Trazodone Hcl     Other reaction(s): dizziness    Past Medical History:  Diagnosis Date   Anxiety    Arthritis    ANKLES AND FEET   Chronic cough since 2020 covid   Chronic headache    COVID 03-2019 AND 08-09-2020 RAPID HOME TEST   03-2019 SYMPTOMS X 6 MONTHS   Dyspnea    WITH EXERTION '@TIMES'$  and talking with left lower lung per pt   Fibromyalgia    Generalized weakness    GERD (gastroesophageal reflux disease)    diet controlled   Hypertension    currently no meds, controlled, checks at home   Meningitis 2003 X 2   VIRAL  Pain    LEFT  SIDE HIP KNEE SHOULDER  and abdominal pain at times   Paresthesias    chronic   PNA (pneumonia) 03/2019   WITH COVID   Post-COVID syndrome    Sleep paralysis    OCC TROUBLE GETTING UP WHEN WAKES UP, PT CALLS THIS SLEEP PALSY   Spinal headache 2003   AFTER SPINAL TAP WITH MENIGITIS 2003   Thyroid disease 2011-RESOLVED   HYPERTHRYOID     Past Surgical History:  Procedure Laterality Date   COLONSCOPY  05/2020   POLYPS X 2 REMOVED ONE PRECANCEROUS ONE WAS BENIGN PER PT   DILATATION & CURETTAGE/HYSTEROSCOPY WITH MYOSURE N/A 10/14/2019   Procedure: DILATATION & CURETTAGE/HYSTEROSCOPY WITH MYOSURE;  Surgeon: Christophe Louis, MD;  Location: [facility];  Service: Gynecology;  Laterality: N/A;   NO PAST SURGERIES     ROBOTIC ASSISTED  LAPAROSCOPIC HYSTERECTOMY AND SALPINGECTOMY Bilateral 09/06/2020   Procedure: XI ROBOTIC ASSISTED LAPAROSCOPIC HYSTERECTOMY AND BILATERAL SALPING-OOPHERECTOMY.;  Surgeon: Christophe Louis, MD;  Location: [facility];  Service: Gynecology;  Laterality: Bilateral;    Family History  Problem Relation Age of Onset   Hypertension Mother    Heart disease Father        5 stents   Allergies Child    Asthma Grandchild    Cancer - Cervical Sister     Social History   Tobacco Use   Smoking status: Never   Smokeless tobacco: Never  Vaping Use   Vaping Use: Never used  Substance Use Topics   Alcohol use: Not Currently   Drug use: No    ROS REFER TO HPI FOR PERTINENT POSITIVES AND NEGATIVES   Objective:   Vitals: BP 102/72 (BP Location: Right Arm)   Pulse 89   Temp 98.2 F (36.8 C) (Oral)   Resp 18   LMP 04/23/2009   SpO2 98%   Physical Exam Vitals and nursing note reviewed.  Constitutional:      Appearance: Normal appearance. She is normal weight. She is not toxic-appearing.     Comments: No pain with palpation chest or back  HENT:     Head: Normocephalic and atraumatic.     Right Ear: Tympanic membrane, ear canal and external ear normal.     Left Ear: Tympanic membrane, ear canal and external ear normal.     Nose: Nose normal.     Mouth/Throat:     Mouth: Mucous membranes are moist.     Tonsils: No tonsillar exudate.     Comments: Postnasal drip noted Eyes:     Extraocular Movements: Extraocular movements intact.     Conjunctiva/sclera: Conjunctivae normal.     Pupils: Pupils are equal, round, and reactive to light.  Cardiovascular:     Rate and Rhythm: Normal rate and regular rhythm.     Pulses: Normal pulses.     Heart sounds: Normal heart sounds.  Pulmonary:     Effort: Pulmonary effort is normal.     Breath sounds: Normal breath sounds.  Abdominal:     General: Abdomen is flat. Bowel sounds are normal. There is no distension.     Palpations: Abdomen  is soft. There is no mass.     Tenderness: There is no abdominal tenderness. There is no rebound.  Musculoskeletal:        General: Normal range of motion.     Cervical back: Normal range of motion and neck supple.  Skin:    General: Skin is warm and  dry.  Neurological:     General: No focal deficit present.     Mental Status: She is alert and oriented to person, place, and time.  Psychiatric:        Mood and Affect: Mood normal.        Behavior: Behavior normal.        Thought Content: Thought content normal.        Judgment: Judgment normal.     Results for orders placed or performed during the hospital encounter of 02/17/22 (from the past 24 hour(s))  POCT Rapid Strep A     Status: None   Collection Time: 02/17/22  5:03 PM  Result Value Ref Range   Streptococcus, Group A Screen (Direct) NEGATIVE NEGATIVE    Assessment and Plan :   PDMP not reviewed this encounter.  1. Acute pharyngitis, unspecified etiology   2. Postnasal drip   3. Other chest pain   4. Shortness of breath    Reassured the patient that her point-of-care strep test was negative.  Her overall exam is reassuring.  She appears well and stable.  Her vital signs are normal.  I did run an EKG, which showed a normal sinus rhythm of 73 bpm.  Slight T wave abnormality noted in V1, but this is present on prior EKG from 09/02/2020, which I personally reviewed today.  I also had a chest x-ray done and this was negative for any acute or concerning findings.  I do think it is appropriate for her to follow-up outpatient with primary care.  She may also consider follow-up with pulmonology and/or cardiology.  He understands that if she has any acute concerning symptoms she will need to present to the emergency department.  She will also monitor her " sick" symptoms and she may consider a COVID test this weekend if worse or any change in there.  Right now she can manage conservatively as directed. I will have her try an  albuterol rescue inhaler and Flonase for her symptoms.  Patient is understanding and agreeable of this plan.    AllwardtRanda Evens, PA-C 02/17/22 1815

## 2022-02-17 NOTE — ED Triage Notes (Signed)
Pt reports a sore throat, body aches, fatigue, SOB and swollen glands. States she started feeling sick about mid day yesterday.  Was possibly exposed to strep.

## 2022-02-17 NOTE — Discharge Instructions (Signed)
Everything looks normal and reassuring on your EKG and your chest x-ray.  No changes from prior imaging.  Your strep throat test was negative.  You may be experiencing some asthma and allergy symptoms.  Please use the albuterol rescue inhaler every 6 hours as needed if you do feel any wheeze or shortness of breath.  You may also use the Flonase nasal spray as directed to help with postnasal drip, which should ease the sore throat pain.  Should any of your symptoms worsen or change, you need to present back to the urgent care or your emergency department depending on the severity.  Please try to follow-up with your primary care or establish care with a new provider such as Dr. Randol Kern.  Nice to meet you today.

## 2022-02-21 ENCOUNTER — Encounter (HOSPITAL_COMMUNITY): Payer: Self-pay | Admitting: Emergency Medicine

## 2022-02-21 ENCOUNTER — Ambulatory Visit: Payer: Commercial Managed Care - HMO

## 2022-02-21 ENCOUNTER — Ambulatory Visit (INDEPENDENT_AMBULATORY_CARE_PROVIDER_SITE_OTHER): Payer: Commercial Managed Care - HMO

## 2022-02-21 ENCOUNTER — Ambulatory Visit (HOSPITAL_COMMUNITY)
Admission: EM | Admit: 2022-02-21 | Discharge: 2022-02-21 | Disposition: A | Discharge status: Home / Self Care | Payer: Commercial Managed Care - HMO | Attending: Family Medicine | Admitting: Family Medicine

## 2022-02-21 DIAGNOSIS — R059 Cough, unspecified: Secondary | ICD-10-CM | POA: Diagnosis not present

## 2022-02-21 DIAGNOSIS — R051 Acute cough: Secondary | ICD-10-CM | POA: Diagnosis not present

## 2022-02-21 DIAGNOSIS — R062 Wheezing: Secondary | ICD-10-CM

## 2022-02-21 MED ORDER — PREDNISONE 20 MG PO TABS: 40.0000 mg | ORAL_TABLET | Freq: Every day | ORAL | 0 refills | Status: DC

## 2022-02-21 MED ORDER — PROMETHAZINE-DM 6.25-15 MG/5ML PO SYRP: 5.0000 mL | ORAL_SOLUTION | Freq: Four times a day (QID) | ORAL | 0 refills | Status: DC | PRN

## 2022-02-21 NOTE — ED Provider Notes (Signed)
Donna Norton   [id number] 02/21/22 Arrival Time: 9798  ASSESSMENT & PLAN:  1. Acute cough   2. Wheezing    I have personally viewed the imaging studies ordered this visit. No acute changes on CXR.  OTC symptom care as needed. No resp distress.  Discharge Medication List as of 02/21/2022 11:48 AM     START taking these medications   Details  predniSONE (DELTASONE) 20 MG tablet Take 2 tablets (40 mg total) by mouth daily., Starting Wed 02/21/2022, Normal    promethazine-dextromethorphan (PROMETHAZINE-DM) 6.25-15 MG/5ML syrup Take 5 mLs by mouth 4 (four) times daily as needed for cough., Starting Wed 02/21/2022, Normal         Follow-up Information     Pahwani, Michell Heinrich, MD.   Specialty: Internal Medicine Why: If worsening or failing to improve as anticipated. Contact information: [address] [place] [address] [phone]                 Reviewed expectations re: course of current medical issues. Questions answered. Outlined signs and symptoms indicating need for more acute intervention. Understanding verbalized. After Visit Summary given.   SUBJECTIVE: History from: Patient. Donna Norton is a 56 y.o. female. Reports begin seen here this weekend; dx allergies. Pt reports she knows it is not allergies. Body aches, fatigue, and coughing up phlegm. This morning when coughing up mucous clear fluid came up as well as "clumpy stuff". No specific SOB. Does feel like she is wheezing. Has been given albuterol inhaler in the past; no formal asthma dx. Afebrile. Normal PO intake without n/v/d.  OBJECTIVE:  Vitals:   02/21/22 1027  BP: (!) 130/90  Pulse: 77  Resp: 17  Temp: 98 F (36.7 C)  SpO2: 95%    General appearance: alert; no distress Eyes: PERRLA; EOMI; conjunctiva normal HENT: [place]; AT; with nasal congestion Neck: supple  Lungs: speaks full sentences without difficulty; unlabored;  Extremities: no edema Skin: warm and  dry Neurologic: normal gait Psychological: alert and cooperative; normal mood and affect  Imaging: DG Chest 2 View  Result Date: 02/21/2022 CLINICAL DATA:  Cough, shortness of breath. EXAM: CHEST - 2 VIEW COMPARISON:  February 17, 2022. FINDINGS: The heart size and mediastinal contours are within normal limits. Both lungs are clear. The visualized skeletal structures are unremarkable. IMPRESSION: No active cardiopulmonary disease. Electronically Signed   By: Marijo Conception M.D.   On: 02/21/2022 10:46    Allergies  Allergen Reactions   Atorvastatin     Other reaction(s): shoulder pain   Ceclor [Cefaclor] Hives   Duloxetine     Other reaction(s): pulling her down" and decreased appetitie, MADE SUICIDAL   Hydrocodone Bit-Homatrop Mbr     Other reaction(s): headaches/ n/v   Methimazole Other (See Comments)    Liver ENZYMES RASIED    Percocet [Oxycodone-Acetaminophen] Hives    CAN TAKE OXYCOONE CANNOT TAKE PERCOCET   Trazodone Hcl     Other reaction(s): dizziness    Past Medical History:  Diagnosis Date   Anxiety    Arthritis    ANKLES AND FEET   Chronic cough since 2020 covid   Chronic headache    COVID 03-2019 AND 08-09-2020 RAPID HOME TEST   03-2019 SYMPTOMS X 6 MONTHS   Dyspnea    WITH EXERTION '@TIMES'$  and talking with left lower lung per pt   Fibromyalgia    Generalized weakness    GERD (gastroesophageal reflux disease)    diet controlled  Hypertension    currently no meds, controlled, checks at home   Meningitis 2003 X 2   VIRAL   Pain    LEFT  SIDE HIP KNEE SHOULDER  and abdominal pain at times   Paresthesias    chronic   PNA (pneumonia) 03/2019   WITH COVID   Post-COVID syndrome    Sleep paralysis    OCC TROUBLE GETTING UP WHEN WAKES UP, PT CALLS THIS SLEEP PALSY   Spinal headache 2003   AFTER SPINAL TAP WITH MENIGITIS 2003   Thyroid disease 2011-RESOLVED   HYPERTHRYOID   Social History   Socioeconomic History   Marital status: Single    Spouse name:  Not on file   Number of children: 3   Years of education: Not on file   Highest education level: Not on file  Occupational History   Not on file  Tobacco Use   Smoking status: Never   Smokeless tobacco: Never  Vaping Use   Vaping Use: Never used  Substance and Sexual Activity   Alcohol use: Not Currently   Drug use: No   Sexual activity: Not on file  Other Topics Concern   Not on file  Social History Narrative   Lives with son in a one story home.  Has 3 children.     Works as an Marine scientist for [employer].     Education: some college.     Social Determinants of Health   Financial Resource Strain: Not on file  Food Insecurity: Not on file  Transportation Needs: Not on file  Physical Activity: Not on file  Stress: Not on file  Social Connections: Not on file  Intimate Partner Violence: Not on file   Family History  Problem Relation Age of Onset   Hypertension Mother    Heart disease Father        5 stents   Allergies Child    Asthma Grandchild    Cancer - Cervical Sister    Past Surgical History:  Procedure Laterality Date   COLONSCOPY  05/2020   POLYPS X 2 REMOVED ONE PRECANCEROUS ONE WAS BENIGN PER PT   DILATATION & CURETTAGE/HYSTEROSCOPY WITH MYOSURE N/A 10/14/2019   Procedure: DILATATION & CURETTAGE/HYSTEROSCOPY WITH MYOSURE;  Surgeon: Christophe Louis, MD;  Location: [facility];  Service: Gynecology;  Laterality: N/A;   NO PAST SURGERIES     ROBOTIC ASSISTED LAPAROSCOPIC HYSTERECTOMY AND SALPINGECTOMY Bilateral 09/06/2020   Procedure: XI ROBOTIC ASSISTED LAPAROSCOPIC HYSTERECTOMY AND BILATERAL SALPING-OOPHERECTOMY.;  Surgeon: Christophe Louis, MD;  Location: [facility];  Service: Gynecology;  Laterality: Bilateral;     Vanessa Kick, MD 02/21/22 [phone]

## 2022-02-21 NOTE — ED Triage Notes (Signed)
Pt reports was seen here this weekend and told it was allergies. Pt reports she know it is not allergies. Body aches, fatigue, and coughing up phlegm. This morning when coughing up mucous clear fluid came up as well as "clumpy stuff".

## 2022-03-01 ENCOUNTER — Other Ambulatory Visit: Payer: Self-pay

## 2022-03-01 ENCOUNTER — Emergency Department (HOSPITAL_COMMUNITY)
Admission: EM | Admit: 2022-03-01 | Discharge: 2022-03-01 | Disposition: A | Discharge status: Home / Self Care | Payer: BC Managed Care – PPO | Attending: Emergency Medicine | Admitting: Emergency Medicine

## 2022-03-01 ENCOUNTER — Emergency Department (HOSPITAL_COMMUNITY): Payer: BC Managed Care – PPO

## 2022-03-01 ENCOUNTER — Encounter (HOSPITAL_COMMUNITY): Payer: Self-pay | Admitting: Emergency Medicine

## 2022-03-01 DIAGNOSIS — Z1152 Encounter for screening for COVID-19: Secondary | ICD-10-CM | POA: Insufficient documentation

## 2022-03-01 DIAGNOSIS — J181 Lobar pneumonia, unspecified organism: Secondary | ICD-10-CM | POA: Insufficient documentation

## 2022-03-01 DIAGNOSIS — Z8616 Personal history of COVID-19: Secondary | ICD-10-CM | POA: Diagnosis not present

## 2022-03-01 DIAGNOSIS — Z7951 Long term (current) use of inhaled steroids: Secondary | ICD-10-CM | POA: Insufficient documentation

## 2022-03-01 DIAGNOSIS — I10 Essential (primary) hypertension: Secondary | ICD-10-CM | POA: Insufficient documentation

## 2022-03-01 DIAGNOSIS — Z79899 Other long term (current) drug therapy: Secondary | ICD-10-CM | POA: Diagnosis not present

## 2022-03-01 DIAGNOSIS — J189 Pneumonia, unspecified organism: Secondary | ICD-10-CM

## 2022-03-01 DIAGNOSIS — R059 Cough, unspecified: Secondary | ICD-10-CM | POA: Diagnosis present

## 2022-03-01 LAB — BASIC METABOLIC PANEL
Anion gap: 6 (ref 5–15)
BUN: 14 mg/dL (ref 6–20)
CO2: 28 mmol/L (ref 22–32)
Calcium: 8.9 mg/dL (ref 8.9–10.3)
Chloride: 105 mmol/L (ref 98–111)
Creatinine, Ser: 0.58 mg/dL (ref 0.44–1.00)
GFR, Estimated: >60 mL/min (ref >60)
Glucose, Bld: 87 mg/dL (ref 70–99)
Potassium: 3.8 mmol/L (ref 3.5–5.1)
Sodium: 139 mmol/L (ref 135–145)

## 2022-03-01 LAB — CBC WITH DIFFERENTIAL/PLATELET
Abs Immature Granulocytes: 0.04 10*3/uL (ref 0.00–0.07)
Basophils Absolute: 0.1 10*3/uL (ref 0.0–0.1)
Basophils Relative: 0 %
Eosinophils Absolute: 0.2 10*3/uL (ref 0.0–0.5)
Eosinophils Relative: 2 %
HCT: 45 % (ref 36.0–46.0)
Hemoglobin: 15 g/dL (ref 12.0–15.0)
Immature Granulocytes: 0 %
Lymphocytes Relative: 36 %
Lymphs Abs: 4.2 10*3/uL — ABNORMAL HIGH (ref 0.7–4.0)
MCH: 30.9 pg (ref 26.0–34.0)
MCHC: 33.3 g/dL (ref 30.0–36.0)
MCV: 92.8 fL (ref 80.0–100.0)
Monocytes Absolute: 0.5 10*3/uL (ref 0.1–1.0)
Monocytes Relative: 4 %
Neutro Abs: 6.7 10*3/uL (ref 1.7–7.7)
Neutrophils Relative %: 58 %
Platelets: 279 10*3/uL (ref 150–400)
RBC: 4.85 MIL/uL (ref 3.87–5.11)
RDW: 12.3 % (ref 11.5–15.5)
WBC: 11.7 10*3/uL — ABNORMAL HIGH (ref 4.0–10.5)
nRBC: 0 % (ref 0.0–0.2)

## 2022-03-01 LAB — RESP PANEL BY RT-PCR (FLU A&B, COVID) ARPGX2
Influenza A by PCR: NEGATIVE
Influenza B by PCR: NEGATIVE
SARS Coronavirus 2 by RT PCR: NEGATIVE

## 2022-03-01 MED ORDER — LEVOFLOXACIN 750 MG PO TABS: 750.0000 mg | ORAL_TABLET | Freq: Every day | ORAL | 0 refills | Status: AC

## 2022-03-01 MED ORDER — KETOROLAC TROMETHAMINE 15 MG/ML IJ SOLN
15.0000 mg | Freq: Once | INTRAMUSCULAR | Status: AC
  Administered 2022-03-01: 15 mg via INTRAMUSCULAR
  Filled 2022-03-01: qty 1

## 2022-03-01 NOTE — Discharge Instructions (Addendum)
We evaluated you for your cough.  Your chest x-ray was negative but your CT scan did show a pneumonia.  We have given you an antibiotic for this.  Please take it once daily for 7 days.  Please follow-up with your primary doctor to make sure your symptoms resolve.  Please return if you develop worsening symptoms, difficulty breathing, severe pain, or any other concerning symptoms.

## 2022-03-01 NOTE — ED Triage Notes (Signed)
Pt reports productive cough x 1 week. Reports she was seen as [facility] and was given meds for a "cold". Pt reports she has gotten worse since then and that she has been coughing up green mucus and "throwing up clumps"  States she also is having a herpes flare up at this time.

## 2022-03-01 NOTE — ED Provider Notes (Signed)
[facility] DEPT Provider Note  CSN: [id number] Arrival date & time: 03/01/22 7673  Chief Complaint(s) Cough and Emesis  HPI Donna Norton is a 56 y.o. female with a history of fibromyalgia, hypertension presenting to the emergency department with cough.  Patient reports cough for 1 week.  She reports it is productive.  She reports she went to an urgent care, was diagnosed with a cold, reports symptoms are worsening.  She reports coughing with occasionally vomiting after coughing.  She denies abdominal pain, ongoing nausea.  She denies any diarrhea.  She reports intermittent fatigue, subjective fevers and chills.  Symptoms are mild.  She is primarily concerned she has a pneumonia.  She reports that she has had a pneumonia before with negative chest x-ray but a positive CT scan and request CT scan.   Past Medical History Past Medical History:  Diagnosis Date   Anxiety    Arthritis    ANKLES AND FEET   Chronic cough since 2020 covid   Chronic headache    COVID 03-2019 AND 08-09-2020 RAPID HOME TEST   03-2019 SYMPTOMS X 6 MONTHS   Dyspnea    WITH EXERTION '@TIMES'$  and talking with left lower lung per pt   Fibromyalgia    Generalized weakness    GERD (gastroesophageal reflux disease)    diet controlled   Hypertension    currently no meds, controlled, checks at home   Meningitis 2003 X 2   VIRAL   Pain    LEFT  SIDE HIP KNEE SHOULDER  and abdominal pain at times   Paresthesias    chronic   PNA (pneumonia) 03/2019   WITH COVID   Post-COVID syndrome    Sleep paralysis    OCC TROUBLE GETTING UP WHEN WAKES UP, PT CALLS THIS SLEEP PALSY   Spinal headache 2003   AFTER SPINAL TAP WITH MENIGITIS 2003   Thyroid disease 2011-RESOLVED   HYPERTHRYOID   Patient Active Problem List   Diagnosis Date Noted   Postmenopausal bleeding 09/06/2020   S/P hysterectomy with oophorectomy 09/06/2020   Essential hypertension, benign 05/31/2013   Cough 05/13/2013    Home Medication(s) Prior to Admission medications   Medication Sig Start Date End Date Taking? Authorizing Provider  levofloxacin (LEVAQUIN) 750 MG tablet Take 1 tablet (750 mg total) by mouth daily for 7 days. 03/01/22 03/08/22 Yes Cristie Hem, MD  acetaminophen (TYLENOL) 500 MG tablet Take 2 tablets (1,000 mg total) by mouth every 6 (six) hours as needed. 10/14/19   Christophe Louis, MD  albuterol [facility] HFA) 108 [phone] Base) MCG/ACT inhaler Inhale 2 puffs into the lungs every 6 (six) hours as needed for wheezing or shortness of breath. 02/17/22   Allwardt, Alyssa M, PA-C  fluticasone (FLONASE) 50 MCG/ACT nasal spray Place 2 sprays into both nostrils daily. 02/17/22   Allwardt, Randa Evens, PA-C  ibuprofen (ADVIL) 800 MG tablet Take 1 tablet (800 mg total) by mouth every 8 (eight) hours as needed for mild pain, moderate pain or cramping. 09/07/20   Christophe Louis, MD  meloxicam (MOBIC) 15 MG tablet Take 1 tablet (15 mg total) by mouth daily. 01/19/21   McDonald, Stephan Minister, DPM  Multiple Vitamins-Minerals (MULTIVITAMIN WITH MINERALS) tablet Take 1 tablet by mouth daily.    [provider]  norethindrone (AYGESTIN) 5 MG tablet Take by mouth. 09/03/20   [provider]  predniSONE (DELTASONE) 20 MG tablet Take 2 tablets (40 mg total) by mouth daily. 02/21/22   Vanessa Kick,  MD  promethazine-dextromethorphan (PROMETHAZINE-DM) 6.25-15 MG/5ML syrup Take 5 mLs by mouth 4 (four) times daily as needed for cough. 02/21/22   Vanessa Kick, MD  rosuvastatin (CRESTOR) 5 MG tablet Take 2.5 mg by mouth daily. 08/30/20   [provider]                                                                                                                                    Past Surgical History Past Surgical History:  Procedure Laterality Date   COLONSCOPY  05/2020   POLYPS X 2 REMOVED ONE PRECANCEROUS ONE WAS BENIGN PER PT   DILATATION & CURETTAGE/HYSTEROSCOPY WITH MYOSURE N/A 10/14/2019   Procedure:  DILATATION & CURETTAGE/HYSTEROSCOPY WITH MYOSURE;  Surgeon: Christophe Louis, MD;  Location: [facility];  Service: Gynecology;  Laterality: N/A;   NO PAST SURGERIES     ROBOTIC ASSISTED LAPAROSCOPIC HYSTERECTOMY AND SALPINGECTOMY Bilateral 09/06/2020   Procedure: XI ROBOTIC ASSISTED LAPAROSCOPIC HYSTERECTOMY AND BILATERAL SALPING-OOPHERECTOMY.;  Surgeon: Christophe Louis, MD;  Location: [facility];  Service: Gynecology;  Laterality: Bilateral;   Family History Family History  Problem Relation Age of Onset   Hypertension Mother    Heart disease Father        5 stents   Allergies Child    Asthma Grandchild    Cancer - Cervical Sister     Social History Social History   Tobacco Use   Smoking status: Never   Smokeless tobacco: Never  Vaping Use   Vaping Use: Never used  Substance Use Topics   Alcohol use: Not Currently   Drug use: No   Allergies Atorvastatin, Ceclor [cefaclor], Duloxetine, Hydrocodone bit-homatrop mbr, Methimazole, Percocet [oxycodone-acetaminophen], and Trazodone hcl  Review of Systems Review of Systems  All other systems reviewed and are negative.   Physical Exam Vital Signs  I have reviewed the triage vital signs BP 133/85   Pulse 72   Temp 98.7 F (37.1 C) (Oral)   Resp 16   LMP 04/23/2009   SpO2 98%  Physical Exam Vitals and nursing note reviewed.  Constitutional:      General: She is not in acute distress.    Appearance: She is well-developed.  HENT:     Head: Normocephalic and atraumatic.     Mouth/Throat:     Mouth: Mucous membranes are moist.  Eyes:     Pupils: Pupils are equal, round, and reactive to light.  Cardiovascular:     Rate and Rhythm: Normal rate and regular rhythm.     Heart sounds: No murmur heard. Pulmonary:     Effort: Pulmonary effort is normal. No respiratory distress.     Breath sounds: Normal breath sounds.  Abdominal:     General: Abdomen is flat.     Palpations: Abdomen is soft.      Tenderness: There is no abdominal tenderness.  Musculoskeletal:        General: No  tenderness.     Right lower leg: No edema.     Left lower leg: No edema.  Skin:    General: Skin is warm and dry.  Neurological:     General: No focal deficit present.     Mental Status: She is alert. Mental status is at baseline.  Psychiatric:        Mood and Affect: Mood normal.        Behavior: Behavior normal.     ED Results and Treatments Labs (all labs ordered are listed, but only abnormal results are displayed) Labs Reviewed  CBC WITH DIFFERENTIAL/PLATELET - Abnormal; Notable for the following components:      Result Value   WBC 11.7 (*)    Lymphs Abs 4.2 (*)    All other components within normal limits  RESP PANEL BY RT-PCR (FLU A&B, COVID) ARPGX2  BASIC METABOLIC PANEL                                                                                                                          Radiology CT Chest Wo Contrast  Result Date: 03/01/2022 CLINICAL DATA:  Productive cough, pneumonia suspected EXAM: CT CHEST WITHOUT CONTRAST TECHNIQUE: Multidetector CT imaging of the chest was performed following the standard protocol without IV contrast. RADIATION DOSE REDUCTION: This exam was performed according to the departmental dose-optimization program which includes automated exposure control, adjustment of the mA and/or kV according to patient size and/or use of iterative reconstruction technique. COMPARISON:  Previous studies including the radiographs done today and CT done on 06/14/2018 FINDINGS: Cardiovascular: There are small scattered calcifications in thoracic aorta. Mediastinum/Nodes: No significant lymphadenopathy is seen. Lungs/Pleura: There is no focal pulmonary consolidation. There is mild ectasia of bronchi in right lower lobe, especially in the anterior basal segment. There is presence of fluid in the lumen of ectatic bronchi in anterior basal segment of right lower lobe. Small patchy  infiltrate is noted in the anterior right lower lung field in right lower lobe. Rest of the lung fields are clear. There is no pleural effusion or pneumothorax. Upper Abdomen: There are fluid density structures in liver measuring up to 4.5 cm in diameter consistent with hepatic cysts. Similar finding was seen in the previous study. Musculoskeletal: No acute findings are seen. IMPRESSION: There is bronchiectasis in anterior basal segment in right lower lobe. There is fluid/mucous in the lumen of ectatic bronchi in this region along with small patchy infiltrates suggesting pneumonia. There is no pleural effusion. No significant lymphadenopathy seen. Hepatic cysts.  Minimal calcifications are seen in thoracic aorta. Electronically Signed   By: Elmer Picker M.D.   On: 03/01/2022 10:13   DG Chest 2 View  Result Date: 03/01/2022 CLINICAL DATA:  Productive cough EXAM: CHEST - 2 VIEW COMPARISON:  02/21/2022 FINDINGS: The heart size and mediastinal contours are within normal limits. Both lungs are clear. The visualized skeletal structures are unremarkable. IMPRESSION: No active cardiopulmonary disease. Electronically Signed   By:  Elmer Picker M.D.   On: 03/01/2022 09:28    Pertinent labs & imaging results that were available during my care of the patient were reviewed by me and considered in my medical decision making (see MDM for details).  Medications Ordered in ED Medications  ketorolac (TORADOL) 15 MG/ML injection 15 mg (15 mg Intramuscular Given 03/01/22 1010)                                                                                                                                     Procedures Procedures  (including critical care time)  Medical Decision Making / ED Course   MDM:  56 year old female presenting to the emergency department with cough.  Patient overall well-appearing, afebrile, reassuring vital signs.  Reassuring exam with clear lungs.  Suspect likely cause is  bronchitis.  Could could also represent upper respiratory tract infection.  Chest x-ray negative for pneumonia.  Low concern for pneumonia but given age, and patient previous report of negative chest x-ray and positive CT scan, reviewed records which does demonstrate this in 2014.  Will provide CT scan to rule out occult pneumonia given persistent symptoms.  CT scan negative, likely discharge.  Patient reports some chest pain with coughing extremely low concern for alternative pathology such as ACS, pulmonary embolism, dissection.  Will check EKG.  Clinical Course as of 03/01/22 1130  Thu Mar 01, 2022  1129 CT scan actually does show a pneumonia.  Will prescribe Levaquin as patient allergic to cephalosporins. Labs reassuring with very mild elevated WBC count. Will discharge patient to home. All questions answered. Patient comfortable with plan of discharge. Return precautions discussed with patient and specified on the after visit summary.  [WS]    Clinical Course User Index [WS] Cristie Hem, MD     Additional history obtained: -External records from outside source obtained and reviewed including: Chart review including previous notes, labs, imaging, consultation notes including visit in 2014, was diagnosed with PNA on CT scan   Lab Tests: -I ordered, reviewed, and interpreted labs.   The pertinent results include:   Labs Reviewed  CBC WITH DIFFERENTIAL/PLATELET - Abnormal; Notable for the following components:      Result Value   WBC 11.7 (*)    Lymphs Abs 4.2 (*)    All other components within normal limits  RESP PANEL BY RT-PCR (FLU A&B, COVID) ARPGX2  BASIC METABOLIC PANEL    Notable for leukocytosis, normal CR/BUN  EKG   EKG Interpretation  Date/Time:  Thursday March 01 2022 10:14:16 EST Ventricular Rate:  57 PR Interval:  151 QRS Duration: 86 QT Interval:  417 QTC Calculation: 406 R Axis:   11 Text Interpretation: Sinus rhythm Low voltage, precordial leads  Borderline T abnormalities, anterior leads Confirmed by Garnette Gunner [phone]) on 03/01/2022 10:51:01 AM         Imaging Studies ordered: I ordered imaging studies including CT chest  On my interpretation imaging demonstrates RLL PNA I independently visualized and interpreted imaging. I agree with the radiologist interpretation   Medicines ordered and prescription drug management: Meds ordered this encounter  Medications   ketorolac (TORADOL) 15 MG/ML injection 15 mg   levofloxacin (LEVAQUIN) 750 MG tablet    Sig: Take 1 tablet (750 mg total) by mouth daily for 7 days.    Dispense:  7 tablet    Refill:  0    -I have reviewed the patients home medicines and have made adjustments as needed  Reevaluation: After the interventions noted above, I reevaluated the patient and found that they have improved  Co morbidities that complicate the patient evaluation  Past Medical History:  Diagnosis Date   Anxiety    Arthritis    ANKLES AND FEET   Chronic cough since 2020 covid   Chronic headache    COVID 03-2019 AND 08-09-2020 RAPID HOME TEST   03-2019 SYMPTOMS X 6 MONTHS   Dyspnea    WITH EXERTION '@TIMES'$  and talking with left lower lung per pt   Fibromyalgia    Generalized weakness    GERD (gastroesophageal reflux disease)    diet controlled   Hypertension    currently no meds, controlled, checks at home   Meningitis 2003 X 2   VIRAL   Pain    LEFT  SIDE HIP KNEE SHOULDER  and abdominal pain at times   Paresthesias    chronic   PNA (pneumonia) 03/2019   WITH COVID   Post-COVID syndrome    Sleep paralysis    OCC TROUBLE GETTING UP WHEN WAKES UP, PT CALLS THIS SLEEP PALSY   Spinal headache 2003   AFTER SPINAL TAP WITH MENIGITIS 2003   Thyroid disease 2011-RESOLVED   HYPERTHRYOID      Dispostion: Disposition decision including need for hospitalization was considered, and patient discharged from emergency department.    Final Clinical Impression(s) / ED  Diagnoses Final diagnoses:  Community acquired pneumonia of right lower lobe of lung     This chart was dictated using voice recognition software.  Despite best efforts to proofread,  errors can occur which can change the documentation meaning.    Cristie Hem, MD 03/01/22 1130

## 2022-04-18 ENCOUNTER — Ambulatory Visit: Payer: Commercial Managed Care - HMO

## 2022-05-08 ENCOUNTER — Ambulatory Visit
Admission: RE | Admit: 2022-05-08 | Discharge: 2022-05-08 | Disposition: A | Discharge status: Home / Self Care | Payer: BC Managed Care – PPO | Source: Ambulatory Visit | Attending: Internal Medicine | Admitting: Internal Medicine

## 2022-05-08 DIAGNOSIS — Z1231 Encounter for screening mammogram for malignant neoplasm of breast: Secondary | ICD-10-CM

## 2022-05-30 ENCOUNTER — Other Ambulatory Visit: Payer: Self-pay

## 2022-05-30 ENCOUNTER — Emergency Department (HOSPITAL_BASED_OUTPATIENT_CLINIC_OR_DEPARTMENT_OTHER)
Admit: 2022-05-30 | Discharge: 2022-05-30 | Disposition: A | Discharge status: Home / Self Care | Payer: BC Managed Care – PPO

## 2022-05-30 ENCOUNTER — Emergency Department (HOSPITAL_COMMUNITY)
Admission: EM | Admit: 2022-05-30 | Discharge: 2022-05-30 | Disposition: A | Discharge status: Home / Self Care | Payer: BC Managed Care – PPO | Attending: Emergency Medicine | Admitting: Emergency Medicine

## 2022-05-30 ENCOUNTER — Emergency Department (HOSPITAL_COMMUNITY): Payer: BC Managed Care – PPO

## 2022-05-30 DIAGNOSIS — R0602 Shortness of breath: Secondary | ICD-10-CM | POA: Diagnosis present

## 2022-05-30 DIAGNOSIS — R52 Pain, unspecified: Secondary | ICD-10-CM | POA: Diagnosis not present

## 2022-05-30 DIAGNOSIS — Z8616 Personal history of COVID-19: Secondary | ICD-10-CM | POA: Insufficient documentation

## 2022-05-30 DIAGNOSIS — R059 Cough, unspecified: Secondary | ICD-10-CM | POA: Diagnosis not present

## 2022-05-30 DIAGNOSIS — I10 Essential (primary) hypertension: Secondary | ICD-10-CM | POA: Insufficient documentation

## 2022-05-30 DIAGNOSIS — R109 Unspecified abdominal pain: Secondary | ICD-10-CM | POA: Diagnosis not present

## 2022-05-30 DIAGNOSIS — R079 Chest pain, unspecified: Secondary | ICD-10-CM

## 2022-05-30 LAB — BASIC METABOLIC PANEL
Anion gap: 10 (ref 5–15)
BUN: 14 mg/dL (ref 6–20)
CO2: 27 mmol/L (ref 22–32)
Calcium: 8.9 mg/dL (ref 8.9–10.3)
Chloride: 104 mmol/L (ref 98–111)
Creatinine, Ser: 0.92 mg/dL (ref 0.44–1.00)
GFR, Estimated: >60 mL/min (ref >60)
Glucose, Bld: 86 mg/dL (ref 70–99)
Potassium: 3.9 mmol/L (ref 3.5–5.1)
Sodium: 141 mmol/L (ref 135–145)

## 2022-05-30 LAB — URINALYSIS, ROUTINE W REFLEX MICROSCOPIC
Bacteria, UA: NONE SEEN
Bilirubin Urine: NEGATIVE
Glucose, UA: NEGATIVE mg/dL
Ketones, ur: NEGATIVE mg/dL
Leukocytes,Ua: NEGATIVE
Nitrite: NEGATIVE
Protein, ur: NEGATIVE mg/dL
Specific Gravity, Urine: 1.017 (ref 1.005–1.030)
pH: 5 (ref 5.0–8.0)

## 2022-05-30 LAB — CBC
HCT: 40.8 % (ref 36.0–46.0)
Hemoglobin: 13.9 g/dL (ref 12.0–15.0)
MCH: 31 pg (ref 26.0–34.0)
MCHC: 34.1 g/dL (ref 30.0–36.0)
MCV: 91.1 fL (ref 80.0–100.0)
Platelets: 293 10*3/uL (ref 150–400)
RBC: 4.48 MIL/uL (ref 3.87–5.11)
RDW: 12.4 % (ref 11.5–15.5)
WBC: 12.3 10*3/uL — ABNORMAL HIGH (ref 4.0–10.5)
nRBC: 0 % (ref 0.0–0.2)

## 2022-05-30 LAB — TROPONIN I (HIGH SENSITIVITY)
Troponin I (High Sensitivity): 2 ng/L (ref <18)
Troponin I (High Sensitivity): 3 ng/L (ref <18)

## 2022-05-30 LAB — D-DIMER, QUANTITATIVE: D-Dimer, Quant: 0.74 ug/mL-FEU — ABNORMAL HIGH (ref 0.00–0.50)

## 2022-05-30 MED ORDER — IOHEXOL 350 MG/ML SOLN
75.0000 mL | Freq: Once | INTRAVENOUS | Status: AC | PRN
  Administered 2022-05-30: 75 mL via INTRAVENOUS

## 2022-05-30 MED ORDER — BENZONATATE 100 MG PO CAPS: 100.0000 mg | ORAL_CAPSULE | Freq: Three times a day (TID) | ORAL | 0 refills | Status: DC

## 2022-05-30 MED ORDER — ALBUTEROL SULFATE HFA 108 (90 BASE) MCG/ACT IN AERS
2.0000 | INHALATION_SPRAY | Freq: Once | RESPIRATORY_TRACT | Status: AC
  Administered 2022-05-30: 2 via RESPIRATORY_TRACT
  Filled 2022-05-30: qty 6.7

## 2022-05-30 NOTE — Discharge Instructions (Addendum)
Please take your medications as prescribed. Take tylenol/ibuprofen for pain. I recommend close follow-up with cardiology and your PCP for reevaluation.  Please do not hesitate to return to emergency department if worrisome signs symptoms we discussed become apparent.

## 2022-05-30 NOTE — ED Triage Notes (Signed)
Pt arrived via POV. Had covid 2 weeks ago. C/o productive couch, int chest pain, and mild SOB. Bilat flank pain for 3x days.  AOx4

## 2022-05-30 NOTE — Progress Notes (Signed)
Left lower extremity venous duplex has been completed. Preliminary results can be found in CV Proc through chart review.  Results were given to Rex Kras PA.  05/30/22 5:03 PM Donna Norton RVT

## 2022-05-30 NOTE — ED Provider Notes (Signed)
[place] EMERGENCY DEPARTMENT AT [facility] Provider Note   CSN: [id number] Arrival date & time: 05/30/22  1307     History  Chief Complaint  Patient presents with   Cough    so   Shortness of Breath    Donna Norton is a 57 y.o. female with past medical history significant for hypertension, fibromyalgia, acid reflux, anxiety, previous pneumonia, post-COVID syndrome who presents with concern for productive cough, chest pain, mild shortness of breath, and some flank pain around the back of her ribs.  Patient reports symptoms been present for around 3 days.  She reports that the chest pain sometimes worse with cough, deep breathing, and exertion.  She reports that the chest pain feels aching, cannot localize or lateralize, she denies crushing chest pain, radiation to arms, neck, nausea, vomiting.  She reports that she feels like she has been having productive cough with production of "salty sputum".   Cough Associated symptoms: shortness of breath   Shortness of Breath Associated symptoms: cough        Home Medications Prior to Admission medications   Medication Sig Start Date End Date Taking? Authorizing Provider  benzonatate (TESSALON) 100 MG capsule Take 1 capsule (100 mg total) by mouth every 8 (eight) hours. 05/30/22  Yes Rex Kras, PA  acetaminophen (TYLENOL) 500 MG tablet Take 2 tablets (1,000 mg total) by mouth every 6 (six) hours as needed. 10/14/19   Christophe Louis, MD  albuterol [facility] HFA) 108 [phone] Base) MCG/ACT inhaler Inhale 2 puffs into the lungs every 6 (six) hours as needed for wheezing or shortness of breath. 02/17/22   Allwardt, Alyssa M, PA-C  fluticasone (FLONASE) 50 MCG/ACT nasal spray Place 2 sprays into both nostrils daily. 02/17/22   Allwardt, Randa Evens, PA-C  ibuprofen (ADVIL) 800 MG tablet Take 1 tablet (800 mg total) by mouth every 8 (eight) hours as needed for mild pain, moderate pain or cramping. 09/07/20   Christophe Louis, MD  meloxicam (MOBIC) 15 MG  tablet Take 1 tablet (15 mg total) by mouth daily. 01/19/21   McDonald, Stephan Minister, DPM  Multiple Vitamins-Minerals (MULTIVITAMIN WITH MINERALS) tablet Take 1 tablet by mouth daily.    [provider]  norethindrone (AYGESTIN) 5 MG tablet Take by mouth. 09/03/20   [provider]  predniSONE (DELTASONE) 20 MG tablet Take 2 tablets (40 mg total) by mouth daily. 02/21/22   Vanessa Kick, MD  promethazine-dextromethorphan (PROMETHAZINE-DM) 6.25-15 MG/5ML syrup Take 5 mLs by mouth 4 (four) times daily as needed for cough. 02/21/22   Vanessa Kick, MD  rosuvastatin (CRESTOR) 5 MG tablet Take 2.5 mg by mouth daily. 08/30/20   [provider]      Allergies    Atorvastatin, Ceclor [cefaclor], Duloxetine, Hydrocodone bit-homatrop mbr, Methimazole, Percocet [oxycodone-acetaminophen], and Trazodone hcl    Review of Systems   Review of Systems  Respiratory:  Positive for cough and shortness of breath.   All other systems reviewed and are negative.   Physical Exam Updated Vital Signs BP 136/85   Pulse 88   Temp 97.8 F (36.6 C)   Resp (!) 21   LMP 04/23/2009   SpO2 98%  Physical Exam Vitals and nursing note reviewed.  Constitutional:      General: She is not in acute distress.    Appearance: Normal appearance.  HENT:     Head: Normocephalic and atraumatic.  Eyes:     General:        Right eye: No  discharge.        Left eye: No discharge.  Cardiovascular:     Rate and Rhythm: Normal rate and regular rhythm.     Heart sounds: No murmur heard.    No friction rub. No gallop.  Pulmonary:     Effort: Pulmonary effort is normal.     Breath sounds: Normal breath sounds.     Comments: No wheezing, rhonchi, stridor, rales on my exam, no excessive respiratory distress, occasional dry cough Chest:     Comments: Patient with some tenderness palpation of the lateral rib cage, no significant anterior chest wall tenderness. Abdominal:     General: Bowel sounds are normal.      Palpations: Abdomen is soft.  Skin:    General: Skin is warm and dry.     Capillary Refill: Capillary refill takes less than 2 seconds.  Neurological:     Mental Status: She is alert and oriented to person, place, and time.  Psychiatric:        Mood and Affect: Mood normal.        Behavior: Behavior normal.     ED Results / Procedures / Treatments   Labs (all labs ordered are listed, but only abnormal results are displayed) Labs Reviewed  CBC - Abnormal; Notable for the following components:      Result Value   WBC 12.3 (*)    All other components within normal limits  URINALYSIS, ROUTINE W REFLEX MICROSCOPIC - Abnormal; Notable for the following components:   Hgb urine dipstick MODERATE (*)    All other components within normal limits  D-DIMER, QUANTITATIVE - Abnormal; Notable for the following components:   D-Dimer, Quant 0.74 (*)    All other components within normal limits  BASIC METABOLIC PANEL  TROPONIN I (HIGH SENSITIVITY)  TROPONIN I (HIGH SENSITIVITY)    EKG EKG Interpretation  Date/Time:  Wednesday May 30 2022 13:16:50 EST Ventricular Rate:  92 PR Interval:  140 QRS Duration: 77 QT Interval:  341 QTC Calculation: 422 R Axis:   29 Text Interpretation: Sinus rhythm Borderline T abnormalities, diffuse leads Confirmed by Lavenia Atlas ([id number]) on 05/31/2022 11:27:39 AM  Radiology VAS [place] LOWER EXTREMITY VENOUS (DVT) (ONLY [facility] & [facility])  Result Date: 05/30/2022  Lower Venous DVT Study Patient Name:  Donna Norton  Date of Exam:   05/30/2022 Medical Rec #: [id number]        Accession #:    [id number] Date of Birth: 09-24-1965        Patient Gender: F Patient Age:   49 years Exam Location:  [facility] Procedure:      VAS [place] LOWER EXTREMITY VENOUS (DVT) Referring Phys: KEN LE --------------------------------------------------------------------------------  Indications: Pain.  Risk Factors: None identified. Limitations: Body habitus and poor ultrasound/tissue  interface. Comparison Study: No prior studies. Performing Technologist: Oliver Hum RVT  Examination Guidelines: A complete evaluation includes B-mode imaging, spectral Doppler, color Doppler, and power Doppler as needed of all accessible portions of each vessel. Bilateral testing is considered an integral part of a complete examination. Limited examinations for reoccurring indications may be performed as noted. The reflux portion of the exam is performed with the patient in reverse Trendelenburg.  +-----+---------------+---------+-----------+----------+--------------+ RIGHTCompressibilityPhasicitySpontaneityPropertiesThrombus Aging +-----+---------------+---------+-----------+----------+--------------+ CFV  Full           Yes      Yes                                 +-----+---------------+---------+-----------+----------+--------------+   +---------+---------------+---------+-----------+----------+--------------+  LEFT     CompressibilityPhasicitySpontaneityPropertiesThrombus Aging +---------+---------------+---------+-----------+----------+--------------+ CFV      Full           Yes      Yes                                 +---------+---------------+---------+-----------+----------+--------------+ SFJ      Full                                                        +---------+---------------+---------+-----------+----------+--------------+ FV Prox  Full                                                        +---------+---------------+---------+-----------+----------+--------------+ FV Mid   Full                                                        +---------+---------------+---------+-----------+----------+--------------+ FV Distal               Yes      Yes                                 +---------+---------------+---------+-----------+----------+--------------+ PFV      Full                                                         +---------+---------------+---------+-----------+----------+--------------+ POP      Full           Yes      Yes                                 +---------+---------------+---------+-----------+----------+--------------+ PTV      Full                                                        +---------+---------------+---------+-----------+----------+--------------+ PERO     Full                                                        +---------+---------------+---------+-----------+----------+--------------+    Summary: RIGHT: - No evidence of common femoral vein obstruction.  LEFT: - There is no evidence of deep vein thrombosis in the lower extremity. However, portions of this examination were limited- see technologist comments above.  - No cystic structure found in the popliteal fossa.  *  See table(s) above for measurements and observations. Electronically signed by Harold Barban MD on 05/30/2022 at 10:06:10 PM.    Final    CT Angio Chest PE W and/or Wo Contrast  Result Date: 05/30/2022 CLINICAL DATA:  Post COVID, productive cough and chest pain with shortness of breath. Suspicion for pulmonary embolism. EXAM: CT ANGIOGRAPHY CHEST WITH CONTRAST TECHNIQUE: Multidetector CT imaging of the chest was performed using the standard protocol during bolus administration of intravenous contrast. Multiplanar CT image reconstructions and MIPs were obtained to evaluate the vascular anatomy. RADIATION DOSE REDUCTION: This exam was performed according to the departmental dose-optimization program which includes automated exposure control, adjustment of the mA and/or kV according to patient size and/or use of iterative reconstruction technique. CONTRAST:  54m OMNIPAQUE IOHEXOL 350 MG/ML SOLN COMPARISON:  March 01, 2022 FINDINGS: Cardiovascular: Aorta is of normal caliber. No pericardial effusion or nodularity. Normal heart size. Central pulmonary arteries are opacified to 3 in 37 Hounsfield units. Study  quality mildly compromised by respiratory motion particularly at the lung bases. No signs of pulmonary embolism to the central segmental level accounting for the respiratory motion seen on the current study. Mediastinum/Nodes: No thoracic inlet, axillary, mediastinal or hilar adenopathy. Esophagus grossly normal. Lungs/Pleura: No consolidation, effusion or pneumothorax. Mild basilar atelectasis. Airways are patent. Upper Abdomen: Low-density hepatic lesions compatible with cysts not changed from previous imaging largest 4.4 cm in the LEFT hepatic lobe. Liver incompletely imaged. Imaged portions of the pancreas, spleen and adrenal glands are unremarkable as are the upper pole of LEFT and RIGHT kidney. No acute gastrointestinal findings to the extent evaluated. Musculoskeletal: No chest wall mass. No acute bone finding. No destructive bone process. Spinal degenerative changes. Review of the MIP images confirms the above findings. IMPRESSION: 1. Study quality mildly compromised by respiratory motion particularly at the lung bases. No signs of pulmonary embolism to the central segmental level accounting for the respiratory motion seen on the current study. 2. No acute cardiopulmonary findings. Electronically Signed   By: GZetta BillsM.D.   On: 05/30/2022 17:34    Procedures Procedures    Medications Ordered in ED Medications  albuterol (VENTOLIN HFA) 108 (90 Base) MCG/ACT inhaler 2 puff (2 puffs Inhalation Given 05/30/22 1419)  iohexol (OMNIPAQUE) 350 MG/ML injection 75 mL (75 mLs Intravenous Contrast Given 05/30/22 1710)    ED Course/ Medical Decision Making/ A&P                             Medical Decision Making Amount and/or Complexity of Data Reviewed Labs: ordered. Radiology: ordered.  Risk Prescription drug management.   This patient is a 57y.o. female  who presents to the ED for concern of shortness of breath with recent COVID infection, cough, some chest, rib pain.   Differential  diagnoses prior to evaluation: The emergent differential diagnosis includes, but is not limited to,  asthma exacerbation, COPD exacerbation, acute upper respiratory infection, acute bronchitis, chronic bronchitis, interstitial lung disease, ARDS, PE, pneumonia, atypical ACS, carbon monoxide poisoning, spontaneous pneumothorax, new CHF vs CHF exacerbation, versus other . This is not an exhaustive differential.   Past Medical History / Co-morbidities: Fibromyalgia, meningitis, acid reflux, hypertension, anxiety, previous pneumonia, COVID, post-COVID syndrome  Additional history: Chart reviewed. Pertinent results include: Lab work, imaging from patient's recent emergency department visits  Physical Exam: Physical exam performed. The pertinent findings include: On exam patient with occasional dry cough, but otherwise in no acute distress,  overall stable vital signs, with mild hypertension on arrival, blood pressure 141/85, she had brief episode of tachypnea with respirations of 21 but otherwise has been eupnyeic.  Stable oxygen saturation on room air.  Newness palpation of the lateral rib cage.  Lab Tests/Imaging studies: Prior to handoff patient with CBC notable for mild leukocytosis, white blood cells 12.3.  She has unremarkable BMP, and plain, chest x-ray with no evidence of acute thoracic abnormality.   D-dimer, troponin, and other lab work pending at time of my handoff.  Cardiac monitoring: EKG obtained and interpreted by my attending physician which shows: Normal sinus rhythm, borderline T wave abnormalities, no evidence of acute ischemic process.   Medications: I ordered medication including albuterol x 1.  I have reviewed the patients home medicines and have made adjustments as needed.   Care of [place] transferred to PA Merrily Pew and Dr. Tyrone Nine at the end of my shift as the patient will require reassessment once labs/imaging have resulted. Patient presentation, ED course, and plan of  care discussed with review of all pertinent labs and imaging. Please see his/her note for further details regarding further ED course and disposition. Plan at time of handoff is pending remaining lab work, imaging patient may need a CTA if elevated D-dimer, to rule out PE in context of shortness of breath and ongoing post-COVID. This may be altered or completely changed at the discretion of the oncoming team pending results of further workup.   Final Clinical Impression(s) / ED Diagnoses Final diagnoses:  Chest pain, unspecified type  Shortness of breath    Rx / DC Orders ED Discharge Orders          Ordered    Ambulatory referral to Cardiology       Comments: If you have not heard from the Cardiology office within the next 72 hours please call [phone].   05/30/22 1808    benzonatate (TESSALON) 100 MG capsule  Every 8 hours        05/30/22 1808              Anselmo Pickler, PA-C 06/01/22 [place], [place], DO 06/01/22 1518

## 2022-05-30 NOTE — ED Provider Notes (Signed)
Patient care taken over at shift handoff from outgoing provider.  Physical Exam  BP 136/85   Pulse 88   Temp 97.8 F (36.6 C)   Resp (!) 21   LMP 04/23/2009   SpO2 98%   Physical Exam Vitals and nursing note reviewed.  Constitutional:      Appearance: Normal appearance.  HENT:     Head: Normocephalic and atraumatic.     Mouth/Throat:     Mouth: Mucous membranes are moist.  Eyes:     General: No scleral icterus. Cardiovascular:     Rate and Rhythm: Normal rate and regular rhythm.     Pulses: Normal pulses.     Heart sounds: Normal heart sounds.  Pulmonary:     Effort: Pulmonary effort is normal.     Breath sounds: Normal breath sounds.  Abdominal:     General: Abdomen is flat.     Palpations: Abdomen is soft.     Tenderness: There is no abdominal tenderness.  Musculoskeletal:        General: No deformity.  Skin:    General: Skin is warm.     Findings: No rash.  Neurological:     General: No focal deficit present.     Mental Status: She is alert.  Psychiatric:        Mood and Affect: Mood normal.     Procedures  Procedures  ED Course / MDM    Medical Decision Making Amount and/or Complexity of Data Reviewed Labs: ordered. Radiology: ordered.  Risk Prescription drug management.   57 year old female presents today for evaluation of cough, shortness of breath, chest pain.  She was found to have leukocytosis 12.3.  D-dimer elevated at 0.74.  Imaging studies including CT angio chest, ultrasound of the left leg for DVT was negative.  Chest x-ray showed no evidence of pneumonia.  Patient states symptoms have improved.  I sent an rx of tessalon pearl for cough and a cardiology referral. Advised patient to follow-up with cardiology for further evaluation and management, return to the ER if new or worsening symptoms.  Treatment plan discussed with patient.  Pt acknowledged understanding was agreeable to the plan. Worrisome signs and symptoms were discussed with  patient, and patient acknowledged understanding to return to the ED if they noticed these signs and symptoms. Patient was stable upon discharge.   This chart was dictated using voice recognition software.  Despite best efforts to proofread,  errors can occur which can change the documentation meaning.       Rex Kras, [place] 05/31/22 [place], DO 05/31/22 1421

## 2022-05-30 NOTE — ED Notes (Signed)
Patient transported to X-ray 

## 2022-06-01 ENCOUNTER — Telehealth: Payer: Self-pay | Admitting: Obstetrics and Gynecology

## 2022-06-01 NOTE — Patient Outreach (Signed)
Transition Care Management Unsuccessful Follow-up Telephone Call  Date of discharge and from where:  05/30/22 from [facility]  Attempts:  1st Attempt  Reason for unsuccessful TCM follow-up call:  No answer/busy

## 2022-06-07 ENCOUNTER — Encounter: Payer: Self-pay | Admitting: Family Medicine

## 2022-06-07 ENCOUNTER — Ambulatory Visit (INDEPENDENT_AMBULATORY_CARE_PROVIDER_SITE_OTHER): Payer: Medicaid Other | Admitting: Family Medicine

## 2022-06-07 VITALS — BP 125/84 | HR 79 | Ht 63.0 in | Wt 206.4 lb

## 2022-06-07 DIAGNOSIS — R350 Frequency of micturition: Secondary | ICD-10-CM | POA: Diagnosis not present

## 2022-06-07 DIAGNOSIS — T466X5A Adverse effect of antihyperlipidemic and antiarteriosclerotic drugs, initial encounter: Secondary | ICD-10-CM

## 2022-06-07 DIAGNOSIS — Z8639 Personal history of other endocrine, nutritional and metabolic disease: Secondary | ICD-10-CM

## 2022-06-07 DIAGNOSIS — Z13228 Encounter for screening for other metabolic disorders: Secondary | ICD-10-CM | POA: Diagnosis not present

## 2022-06-07 DIAGNOSIS — Z114 Encounter for screening for human immunodeficiency virus [HIV]: Secondary | ICD-10-CM | POA: Diagnosis not present

## 2022-06-07 DIAGNOSIS — Z1159 Encounter for screening for other viral diseases: Secondary | ICD-10-CM

## 2022-06-07 DIAGNOSIS — G720 Drug-induced myopathy: Secondary | ICD-10-CM

## 2022-06-07 NOTE — Assessment & Plan Note (Signed)
Suspect that her symptoms are likely due to her known history of diabetes.  However, no A1c or records on file.  She is currently not taking any medications for this.  Given lack of dysuria, hematuria, abdominal pain, fevers, low suspicion for UTI.  Will check A1c as well as UACR.  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

## 2022-06-07 NOTE — Progress Notes (Signed)
Subjective:  Patient ID: Donna Norton, female    DOB: 06-10-1965, 57 y.o.   MRN: [id number]  CC: New Patient  HPI:  Donna Norton is a very pleasant 57 y.o. female who presents today to establish care.  Used to see [facility] physicians  PMHx includes fibromyalgia, diabetes, back pain, hypertension, HLD, s/p hysterectomy, Graves disease  For diabetes, currently diet controlled. Reports last A1c was 8 a couple of years ago  For HTN, used to be on medication but no longer taking anything - feels well controlled   For HLD, tried taking atorvastatin and rosuvastatin but had significant myalgias with these  Current concerns: some urinary frequency at night, usually worse after having heavy/sugary meals, feels that diabetes is progressing due to poor diet recently. Denies dysuria, hematuria, fevers. Also reports feeling more fatigued than normal over the past couple of years.   PMHx: Past Medical History:  Diagnosis Date   Anxiety    Arthritis    ANKLES AND FEET   Chronic cough since 2020 covid   Chronic headache    COVID 03-2019 AND 08-09-2020 RAPID HOME TEST   03-2019 SYMPTOMS X 6 MONTHS   Dyspnea    WITH EXERTION @TIMES$  and talking with left lower lung per pt   Fibromyalgia    Generalized weakness    GERD (gastroesophageal reflux disease)    diet controlled   Hypertension    currently no meds, controlled, checks at home   Meningitis 2003 X 2   VIRAL   Pain    LEFT  SIDE HIP KNEE SHOULDER  and abdominal pain at times   Paresthesias    chronic   PNA (pneumonia) 03/2019   WITH COVID   Post-COVID syndrome    Sleep paralysis    OCC TROUBLE GETTING UP WHEN WAKES UP, PT CALLS THIS SLEEP PALSY   Spinal headache 2003   AFTER SPINAL TAP WITH MENIGITIS 2003   Thyroid disease 2011-RESOLVED   HYPERTHRYOID    Surgical Hx: Past Surgical History:  Procedure Laterality Date   COLONSCOPY  05/2020   POLYPS X 2 REMOVED ONE PRECANCEROUS ONE WAS BENIGN PER PT   DILATATION &  CURETTAGE/HYSTEROSCOPY WITH MYOSURE N/A 10/14/2019   Procedure: DILATATION & CURETTAGE/HYSTEROSCOPY WITH MYOSURE;  Surgeon: Christophe Louis, MD;  Location: [place];  Service: Gynecology;  Laterality: N/A;   NO PAST SURGERIES     ROBOTIC ASSISTED LAPAROSCOPIC HYSTERECTOMY AND SALPINGECTOMY Bilateral 09/06/2020   Procedure: XI ROBOTIC ASSISTED LAPAROSCOPIC HYSTERECTOMY AND BILATERAL SALPING-OOPHERECTOMY.;  Surgeon: Christophe Louis, MD;  Location: [place];  Service: Gynecology;  Laterality: Bilateral;    Family Hx: Family History  Problem Relation Age of Onset   Hypertension Mother    Thyroid disease Mother    Heart disease Father        5 stents   Hypertension Father    Diabetes Father    Cancer - Cervical Sister    Allergies Child    Asthma Grandchild     Social Hx: Current Social History   Who lives at home: lives alone  Who would speak for you about health care matters: Daughter Scientific laboratory technician  Transportation: Drives  Work / Education:  Control and instrumentation engineer at [employer]     Medications: PRN diclofenac for joint pain PRN albuterol for asthma  ROS:  Endorses some occasional constipation/diarrhea. Endorses occasional dizziness. Denies current CP/SOB, dysuria, hematuria, hematochezia. Denies orthopnea, PND. Able to lay flat at night.   Preventative Screening Colonoscopy:  year 2022 per patient, no records on file Mammogram: Jan 2024 S/p total hysterectomy 2022 -Per op note on 09/06/2020 by Dr. Landry Mellow, patient no longer has a cervix Tetanus vaccine: patient reports in 2022 Patient states she will establish with an eye doctor soon  Never smoked cigarettes Very rare alcohol use No marijuana, cocaine, IV drugs  ROS: pertinent noted in the HPI    Objective:  BP 125/84   Pulse 79   Ht 5' 3"$  (1.6 m)   Wt 206 lb 6 oz (93.6 kg)   LMP 04/23/2009   SpO2 99%   BMI 36.56 kg/m  Vitals and nursing note reviewed  General: NAD, pleasant, able to  participate in exam Cardiac: RRR, no murmurs auscultated Respiratory: CTAB, normal WOB Abdomen: soft, non-tender, non-distended, normoactive bowel sounds Extremities: warm and well perfused, no edema or cyanosis Skin: warm and dry, no rashes noted Neuro: alert, no obvious focal deficits, speech normal Psych: Normal affect and mood  Assessment & Plan:   Donna Norton is a 57 y.o. female presenting to establish care.  BP normal, but reports history of diabetes, thyroid disease, hyperlipidemia.  Will check labs today given this information and few records available on file.  Urinary frequency Assessment & Plan: Suspect that her symptoms are likely due to her known history of diabetes.  However, no A1c or records on file.  She is currently not taking any medications for this.  Given lack of dysuria, hematuria, abdominal pain, fevers, low suspicion for UTI.  Will check A1c as well as UACR.  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  Orders: -     Hemoglobin A1c -     Microalbumin / creatinine urine ratio  Screening for metabolic disorder -     Lipid panel  Need for hepatitis C screening test -     HCV Ab w Reflex to Quant PCR  Encounter for screening for HIV -     HIV Antibody (routine testing w rflx)  History of Graves' disease -     TSH Rfx on Abnormal to Free T4  Statin myopathy   No orders of the defined types were placed in this encounter.  Return in about 6 months (around 12/06/2022). August Albino, MD 06/07/2022, 4:27 PM PGY-1, [place]

## 2022-06-07 NOTE — Patient Instructions (Signed)
It was wonderful to see you today.  Please bring ALL of your medications with you to every visit.   Updates from today's visit:  We are checking some labs today - I will call you if anything is abnormal. If everything looks good, I will send you a mychart message  Please follow up in 6 months   Thank you for choosing [facility].   Please call [phone] with any questions about today's appointment.  Please be sure to schedule follow up at the front  desk before you leave today.   August Albino, MD  Family Medicine

## 2022-06-08 LAB — HIV ANTIBODY (ROUTINE TESTING W REFLEX): HIV Screen 4th Generation wRfx: NONREACTIVE

## 2022-06-08 LAB — TSH RFX ON ABNORMAL TO FREE T4: TSH: 4.59 u[IU]/mL — ABNORMAL HIGH (ref 0.450–4.500)

## 2022-06-08 LAB — LIPID PANEL
Chol/HDL Ratio: 4.9 ratio — ABNORMAL HIGH (ref 0.0–4.4)
Cholesterol, Total: 240 mg/dL — ABNORMAL HIGH (ref 100–199)
HDL: 49 mg/dL (ref >39)
LDL Chol Calc (NIH): 166 mg/dL — ABNORMAL HIGH (ref 0–99)
Triglycerides: 137 mg/dL (ref 0–149)
VLDL Cholesterol Cal: 25 mg/dL (ref 5–40)

## 2022-06-08 LAB — MICROALBUMIN / CREATININE URINE RATIO
Creatinine, Urine: 240.7 mg/dL
Microalb/Creat Ratio: 17 mg/g creat (ref 0–29)
Microalbumin, Urine: 41.6 ug/mL

## 2022-06-08 LAB — HCV INTERPRETATION

## 2022-06-08 LAB — T4F: T4,Free (Direct): 1.22 ng/dL (ref 0.82–1.77)

## 2022-06-08 LAB — HEMOGLOBIN A1C
Est. average glucose Bld gHb Est-mCnc: 117 mg/dL
Hgb A1c MFr Bld: 5.7 % — ABNORMAL HIGH (ref 4.8–5.6)

## 2022-06-08 LAB — HCV AB W REFLEX TO QUANT PCR: HCV Ab: NONREACTIVE

## 2022-06-11 ENCOUNTER — Encounter: Payer: Self-pay | Admitting: Family Medicine

## 2022-06-11 DIAGNOSIS — R7303 Prediabetes: Secondary | ICD-10-CM | POA: Insufficient documentation

## 2022-06-11 NOTE — Progress Notes (Unsigned)
Cardiology Office Note:    Date:  06/13/2022   ID:  Donna Norton, DOB [birth year], MRN [id number]  PCP:  August Albino, [facility] Providers Cardiologist:  Lenna Sciara, MD Referring MD: Rex Kras, [place]   Chief Complaint/Reason for Referral:  Chest pain and dyspnea  ASSESSMENT:    1. Precordial pain   2. Dyspnea on exertion   3. Prediabetes   4. Hyperlipidemia LDL goal <70   5. BMI 36.0-36.9,adult   6. Snoring     PLAN:    In order of problems listed above:  Chest pain:  Start Imdur 30 QPM, Toprol XL 12.93m QPM, and ASA 845m  We will obtain a coronary CTA and echocardiogram to evaluate further.  If the patient has mild obstructive coronary artery disease, they will require a statin (with goal LDL < 70) and aspirin, if they have high-grade disease we will need to consider optimal medical therapy and if symptoms are refractory to medical therapy, then a cardiac catheterization with possible PCI will be pursued to alleviate symptoms.  If they have high risk disease we will proceed directly to cardiac catheterization.  If coronary CTA is reassuring we will alter medical therapy.  Will consider empiric Lasix for diastolic dysfunction as well based on echocardiogram. Dyspnea:  See discussion above. May add lasix depending on results of testing. Prediabetes:  Will hold off on Jardiance for now Hyperlipidemia: The patient had an LDL drawn recently which was 166.  Myalgias in response to 2 statins.  Will refer to pharmacy for further recommendations.  Check Lp(a) today. Elevated BMI: Refer to pharmacy for recommendations.           Dispo:  Return in about 3 months (around 09/11/2022).      Medication Adjustments/Labs and Tests Ordered: Current medicines are reviewed at length with the patient today.  Concerns regarding medicines are outlined above.  The following changes have been made:     Labs/tests ordered: Orders Placed This Encounter  Procedures   CT CORONARY  MORPH W/CTA COR W/SCORE W/CA W/CM &/OR WO/CM   Basic Metabolic Panel (BMET)   Lipoprotein A (LPA)   AMB Referral to [facility]   ECHOCARDIOGRAM COMPLETE    Medication Changes: Meds ordered this encounter  Medications   aspirin EC 81 MG tablet    Sig: Take 1 tablet (81 mg total) by mouth daily. Swallow whole.    Dispense:  90 tablet    Refill:  3   isosorbide mononitrate (IMDUR) 30 MG 24 hr tablet    Sig: Take 1 tablet (30 mg total) by mouth at bedtime.    Dispense:  90 tablet    Refill:  3   metoprolol succinate (TOPROL XL) 25 MG 24 hr tablet    Sig: Take 0.5 tablets (12.5 mg total) by mouth at bedtime.    Dispense:  45 tablet    Refill:  3   metoprolol tartrate (LOPRESSOR) 100 MG tablet    Sig: Take 1 tablet (100 mg total) by mouth once for 1 dose. Take 90-120 minutes prior to scan.    Dispense:  1 tablet    Refill:  0     Current medicines are reviewed at length with the patient today.  The patient does not have concerns regarding medicines.   History of Present Illness:    FOCUSED PROBLEM LIST:   T2DM, diet controlled Hyperlipidemia, intolerant of atorvastatin and crestor due to myalgias Graves disease BMI 36  The patient  is a 57 y.o. female with the indicated medical history here for recommendations regarding chest pain and dyspnea.  The patient was seen by her PCP recently with these complaints.  An EKG, troponins and other laboratories were reassuring; her D-Dimer was elevated and a LE doppler was negative.  The patient is here today and reports chest discomfort, chest fullness on recumbency, and shortness of breath.  This happens with exertion and resolves with rest.  She denies any symptoms at rest.  She denies any bleeding or bruising.  She does snore and because of problems with sleep hygiene is right now reluctant to get a sleep apnea evaluation.  She did have some peripheral edema recently but it seems to be better now that she has been off her feet for  some time.  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  She fortunately has not required any emergency room visits or hospitalizations.  She does not smoke.          Current Medications: Current Meds  Medication Sig   acetaminophen (TYLENOL) 500 MG tablet Take 2 tablets (1,000 mg total) by mouth every 6 (six) hours as needed.   albuterol (PROAIR HFA) 108 (90 Base) MCG/ACT inhaler Inhale 2 puffs into the lungs every 6 (six) hours as needed for wheezing or shortness of breath.   aspirin EC 81 MG tablet Take 1 tablet (81 mg total) by mouth daily. Swallow whole.   benzonatate (TESSALON) 100 MG capsule Take 1 capsule (100 mg total) by mouth every 8 (eight) hours.   fluticasone (FLONASE) 50 MCG/ACT nasal spray Place 2 sprays into both nostrils daily.   ibuprofen (ADVIL) 800 MG tablet Take 1 tablet (800 mg total) by mouth every 8 (eight) hours as needed for mild pain, moderate pain or cramping.   isosorbide mononitrate (IMDUR) 30 MG 24 hr tablet Take 1 tablet (30 mg total) by mouth at bedtime.   meloxicam (MOBIC) 15 MG tablet Take 1 tablet (15 mg total) by mouth daily.   metoprolol succinate (TOPROL XL) 25 MG 24 hr tablet Take 0.5 tablets (12.5 mg total) by mouth at bedtime.   metoprolol tartrate (LOPRESSOR) 100 MG tablet Take 1 tablet (100 mg total) by mouth once for 1 dose. Take 90-120 minutes prior to scan.   Multiple Vitamins-Minerals (MULTIVITAMIN WITH MINERALS) tablet Take 1 tablet by mouth daily.     Allergies:    Atorvastatin, Ceclor [cefaclor], Crestor [rosuvastatin], Duloxetine, Hydrocodone bit-homatrop mbr, Methimazole, Percocet [oxycodone-acetaminophen], and Trazodone hcl   Social History:   Social History   Tobacco Use   Smoking status: Never   Smokeless tobacco: Never  Vaping Use   Vaping Use: Never used  Substance Use Topics   Alcohol use: Not Currently   Drug use: No     Family Hx: Family History  Problem Relation Age of Onset   Hypertension  Mother    Thyroid disease Mother    Heart disease Father        5 stents   Hypertension Father    Diabetes Father    Cancer - Cervical Sister    Allergies Child    Asthma Grandchild      Review of Systems:   Please see the history of present illness.    All other systems reviewed and are negative.     EKGs/Labs/Other Test Reviewed:    EKG:  EKG performed February 2024 that I personally reviewed demonstrates SR with NSTT  Prior CV studies:  Monitor 2022: Sinus rhythm with average heart rate of 83 bpm. (Appropriate increase and decrease throught daily cycle) Rare PVC's and PAC's No atrial fibrillation, no pauses Symptoms reported (palpitations, chest pain) were associated with normal rhythm  Other studies Reviewed: Review of the additional studies/records demonstrates: Imaging studies reviewed do not demonstrate aortic atherosclerosis or coronary artery calcification  Recent Labs: 05/30/2022: BUN 14; Creatinine, Ser 0.92; Hemoglobin 13.9; Platelets 293; Potassium 3.9; Sodium 141 06/07/2022: TSH 4.590   Recent Lipid Panel Lab Results  Component Value Date/Time   CHOL 240 (H) 06/07/2022 04:54 PM   TRIG 137 06/07/2022 04:54 PM   HDL 49 06/07/2022 04:54 PM   LDLCALC 166 (H) 06/07/2022 04:54 PM    Risk Assessment/Calculations:                Physical Exam:    VS:  BP 124/82   Pulse 90   Ht 5' 3"$  (1.6 m)   Wt 205 lb 6.4 oz (93.2 kg)   LMP 04/23/2009   SpO2 98%   BMI 36.38 kg/m    Wt Readings from Last 3 Encounters:  06/13/22 205 lb 6.4 oz (93.2 kg)  06/12/22 201 lb 2 oz (91.2 kg)  06/07/22 206 lb 6 oz (93.6 kg)    GENERAL:  No apparent distress, AOx3 HEENT:  No carotid bruits, +2 carotid impulses, no scleral icterus CAR: RRR no murmurs, gallops, rubs, or thrills RES:  Clear to auscultation bilaterally ABD:  Soft, nontender, nondistended, positive bowel sounds x 4 VASC:  +2 radial pulses, +2 carotid pulses, palpable pedal pulses NEURO:  CN 2-12 grossly  intact; motor and sensory grossly intact PSYCH:  No active depression or anxiety EXT:  No edema, ecchymosis, or cyanosis  Signed, Early Osmond, MD  06/13/2022 1:46 PM    [address], [place],   [address] Phone: [phone]; Fax: [phone]   Note:  This document was prepared using Dragon voice recognition software and may include unintentional dictation errors.

## 2022-06-12 ENCOUNTER — Ambulatory Visit (INDEPENDENT_AMBULATORY_CARE_PROVIDER_SITE_OTHER): Payer: Medicaid Other | Admitting: Family Medicine

## 2022-06-12 VITALS — BP 107/85 | HR 95 | Temp 97.9°F | Ht 63.0 in | Wt 201.1 lb

## 2022-06-12 DIAGNOSIS — B349 Viral infection, unspecified: Secondary | ICD-10-CM | POA: Diagnosis not present

## 2022-06-12 NOTE — Assessment & Plan Note (Signed)
-  symptoms likely consistent with viral etiology, low suspicion for bacterial cause given exam so no indication for antibiotics at this time. Patient politely declines influenza and COVID testing. -supportive care discussed -reassurance provided -work note provided -strict return and ED precautions discussed

## 2022-06-12 NOTE — Progress Notes (Signed)
    SUBJECTIVE:   CHIEF COMPLAINT / HPI:   Patient presents with cough and congestion since 3 days ago. It started with chills, fatigue and myalgias. Reports decreased appetite since symptoms started but she is trying to stay hydrated. Denies fever, rhinorrhea, dysuria, chest pain, difficulty breathing or abdominal pain. Had COVID about a month ago. 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 Has not done a home test for COVID.   OBJECTIVE:   BP 107/85   Pulse 95   Temp 97.9 F (36.6 C) (Oral)   Ht 5' 3"$  (1.6 m)   Wt 201 lb 2 oz (91.2 kg)   LMP 04/23/2009   SpO2 97%   BMI 35.63 kg/m   General: Patient well-appearing, in no acute distress. HEENT: moist mucous membranes, presence of mild anterior cervical LAD, nonbulging TM bilaterally without erythema or drainage  CV: RRR, no murmurs or gallops auscultated  Resp: CTAB, no wheezing, rales or rhonchi noted, breathing comfortably on room air without signs of respiratory distress.  ASSESSMENT/PLAN:   Viral illness -symptoms likely consistent with viral etiology, low suspicion for bacterial cause given exam so no indication for antibiotics at this time. Patient politely declines influenza and COVID testing. -supportive care discussed -reassurance provided -work note provided -strict return and ED precautions discussed      Donney Dice, [place]

## 2022-06-12 NOTE — Patient Instructions (Signed)
It was great seeing you today!  Today we discussed your symptoms, it seems you caught a viral illness from school. Please rest, hydrated and honey can help with the cough. Using a humidifier can help clear some of the congestion as well.   If you are not improving by Friday then please schedule an appointment in our clinic.  If you get chest pain or shortness of breath then please go to the emergency department.   Please follow up at your next scheduled appointment, if anything arises between now and then, please don't hesitate to contact our office.   Thank you for allowing [place] to be a part of your medical care!  Thank you, Dr. Larae Grooms  Also a reminder of our clinic's no-show policy. Please make sure to arrive at least 15 minutes prior to your scheduled appointment time. Please try to cancel before 24 hours if you are not able to make it. If you no-show for 2 appointments then you will be receiving a warning letter. If you no-show after 3 visits, then you may be at risk of being dismissed from our clinic. This is to ensure that everyone is able to be seen in a timely manner. Thank you, we appreciate your assistance with this!

## 2022-06-13 ENCOUNTER — Encounter: Payer: Self-pay | Admitting: Internal Medicine

## 2022-06-13 ENCOUNTER — Ambulatory Visit: Payer: BC Managed Care – PPO | Attending: Internal Medicine | Admitting: Internal Medicine

## 2022-06-13 VITALS — BP 124/82 | HR 90 | Ht 63.0 in | Wt 205.4 lb

## 2022-06-13 DIAGNOSIS — E119 Type 2 diabetes mellitus without complications: Secondary | ICD-10-CM

## 2022-06-13 DIAGNOSIS — R0609 Other forms of dyspnea: Secondary | ICD-10-CM

## 2022-06-13 DIAGNOSIS — E785 Hyperlipidemia, unspecified: Secondary | ICD-10-CM | POA: Diagnosis not present

## 2022-06-13 DIAGNOSIS — R7303 Prediabetes: Secondary | ICD-10-CM | POA: Diagnosis not present

## 2022-06-13 DIAGNOSIS — R0683 Snoring: Secondary | ICD-10-CM

## 2022-06-13 DIAGNOSIS — R072 Precordial pain: Secondary | ICD-10-CM | POA: Diagnosis not present

## 2022-06-13 DIAGNOSIS — Z6836 Body mass index (BMI) 36.0-36.9, adult: Secondary | ICD-10-CM

## 2022-06-13 MED ORDER — METOPROLOL TARTRATE 100 MG PO TABS: 100.0000 mg | ORAL_TABLET | Freq: Once | ORAL | 0 refills | Status: DC

## 2022-06-13 MED ORDER — ASPIRIN 81 MG PO TBEC: 81.0000 mg | DELAYED_RELEASE_TABLET | Freq: Every day | ORAL | 3 refills | Status: AC

## 2022-06-13 MED ORDER — METOPROLOL SUCCINATE ER 25 MG PO TB24: 12.5000 mg | ORAL_TABLET | Freq: Every day | ORAL | 3 refills | Status: DC

## 2022-06-13 MED ORDER — ISOSORBIDE MONONITRATE ER 30 MG PO TB24: 30.0000 mg | ORAL_TABLET | Freq: Every day | ORAL | 3 refills | Status: DC

## 2022-06-13 NOTE — Patient Instructions (Addendum)
Medication Instructions:  Your physician has recommended you make the following change in your medication:  1.) start aspirin 81 mg - take one tablet daily 2.) start isosorbide (IMDUR) 30 mg - take one tablet daily at bedtime 3.) start metoprolol succinate (Toprol XL) 25 mg - take HALF TAB (12.5 mg) daily at bedtime  *If you need a refill on your cardiac medications before your next appointment, please call your pharmacy*   Lab Work: Today: bmet, Lp(a)  If you have labs (blood work) drawn today and your tests are completely normal, you will receive your results only by: [place] (if you have MyChart) OR A paper copy in the mail If you have any lab test that is abnormal or we need to change your treatment, we will call you to review the results.   Testing/Procedures: Your physician has requested that you have an echocardiogram. Echocardiography is a painless test that uses sound waves to create images of your heart. It provides your doctor with information about the size and shape of your heart and how well your heart's chambers and valves are working. This procedure takes approximately one hour. There are no restrictions for this procedure. Please do NOT wear cologne, perfume, aftershave, or lotions (deodorant is allowed). Please arrive 15 minutes prior to your appointment time.  Cardiac CTA - see instructions below   Follow-Up: At [facility], you and your health needs are our priority.  As part of our continuing mission to provide you with exceptional heart care, we have created designated Provider Care Teams.  These Care Teams include your primary Cardiologist (physician) and Advanced Practice Providers (APPs -  Physician Assistants and Nurse Practitioners) who all work together to provide you with the care you need, when you need it.   Your next appointment:   3-4 month(s)  Provider:   Early Osmond, MD     You have been referred to Clinical Pharmacy Team  for Lipid Management.     Your cardiac CT will be scheduled at  [facility] [place], [place] [address] [phone]  Please arrive at the [facility] and Children's Entrance (Entrance C2) of [facility] 30 minutes prior to test start time. You can use the FREE valet parking offered at entrance C (encouraged to control the heart rate for the test)  Proceed to the [facility] Radiology Department (first floor) to check-in and test prep.  All radiology patients and guests should use entrance C2 at [facility], accessed from [facility], even though the hospital's physical address listed is [address].     Please follow these instructions carefully (unless otherwise directed):   On the Night Before the Test: Be sure to Drink plenty of water. Do not consume any caffeinated/decaffeinated beverages or chocolate 12 hours prior to your test.  On the Day of the Test: Drink plenty of water until 1 hour prior to the test. Do not eat any food 1 hour prior to test. You may take your regular medications prior to the test.  Take metoprolol (Lopressor) two hours prior to test. FEMALES- please wear underwire-free bra if available, avoid dresses & tight clothing      After the Test: Drink plenty of water. After receiving IV contrast, you may experience a mild flushed feeling. This is normal. On occasion, you may experience a mild rash up to 24 hours after the test. This is not dangerous. If this occurs, you can  take Benadryl 25 mg and increase your fluid intake. If you experience trouble breathing, this can be serious. If it is severe call 911 IMMEDIATELY. If it is mild, please call our office. If you take any of these medications: Glipizide/Metformin, Avandament, Glucavance, please do not take 48 hours after completing test unless otherwise instructed.  We will call to schedule your test 2-4 weeks out understanding that some  insurance companies will need an authorization prior to the service being performed.   For non-scheduling related questions, please contact the cardiac imaging nurse navigator should you have any questions/concerns: Marchia Bond, Cardiac Imaging Nurse Navigator Gordy Clement, Cardiac Imaging Nurse Navigator [facility] Direct Office Dial: [phone]   For scheduling needs, including cancellations and rescheduling, please call [place], [phone].

## 2022-06-14 LAB — BASIC METABOLIC PANEL
BUN/Creatinine Ratio: 22 (ref 9–23)
BUN: 15 mg/dL (ref 6–24)
CO2: 22 mmol/L (ref 20–29)
Calcium: 9.6 mg/dL (ref 8.7–10.2)
Chloride: 102 mmol/L (ref 96–106)
Creatinine, Ser: 0.67 mg/dL (ref 0.57–1.00)
Glucose: 79 mg/dL (ref 70–99)
Potassium: 4.2 mmol/L (ref 3.5–5.2)
Sodium: 141 mmol/L (ref 134–144)
eGFR: 101 mL/min/{1.73_m2} (ref >59)

## 2022-06-14 LAB — LIPOPROTEIN A (LPA): Lipoprotein (a): 312.6 nmol/L — ABNORMAL HIGH (ref <75.0)

## 2022-06-21 ENCOUNTER — Emergency Department (HOSPITAL_COMMUNITY)
Admission: EM | Admit: 2022-06-21 | Discharge: 2022-06-21 | Disposition: A | Discharge status: Home / Self Care | Payer: BC Managed Care – PPO | Attending: Emergency Medicine | Admitting: Emergency Medicine

## 2022-06-21 ENCOUNTER — Emergency Department (HOSPITAL_COMMUNITY): Payer: BC Managed Care – PPO

## 2022-06-21 ENCOUNTER — Other Ambulatory Visit: Payer: Self-pay

## 2022-06-21 ENCOUNTER — Encounter (HOSPITAL_COMMUNITY): Payer: Self-pay

## 2022-06-21 DIAGNOSIS — Z79899 Other long term (current) drug therapy: Secondary | ICD-10-CM | POA: Diagnosis not present

## 2022-06-21 DIAGNOSIS — Z1152 Encounter for screening for COVID-19: Secondary | ICD-10-CM | POA: Insufficient documentation

## 2022-06-21 DIAGNOSIS — I10 Essential (primary) hypertension: Secondary | ICD-10-CM | POA: Insufficient documentation

## 2022-06-21 DIAGNOSIS — R0602 Shortness of breath: Secondary | ICD-10-CM | POA: Diagnosis present

## 2022-06-21 DIAGNOSIS — Z7982 Long term (current) use of aspirin: Secondary | ICD-10-CM | POA: Insufficient documentation

## 2022-06-21 DIAGNOSIS — J40 Bronchitis, not specified as acute or chronic: Secondary | ICD-10-CM | POA: Insufficient documentation

## 2022-06-21 DIAGNOSIS — R079 Chest pain, unspecified: Secondary | ICD-10-CM

## 2022-06-21 LAB — CBC WITH DIFFERENTIAL/PLATELET
Abs Immature Granulocytes: 0.03 10*3/uL (ref 0.00–0.07)
Basophils Absolute: 0.1 10*3/uL (ref 0.0–0.1)
Basophils Relative: 1 %
Eosinophils Absolute: 0.2 10*3/uL (ref 0.0–0.5)
Eosinophils Relative: 2 %
HCT: 45.8 % (ref 36.0–46.0)
Hemoglobin: 15.3 g/dL — ABNORMAL HIGH (ref 12.0–15.0)
Immature Granulocytes: 0 %
Lymphocytes Relative: 38 %
Lymphs Abs: 3.9 10*3/uL (ref 0.7–4.0)
MCH: 30.6 pg (ref 26.0–34.0)
MCHC: 33.4 g/dL (ref 30.0–36.0)
MCV: 91.6 fL (ref 80.0–100.0)
Monocytes Absolute: 0.6 10*3/uL (ref 0.1–1.0)
Monocytes Relative: 6 %
Neutro Abs: 5.5 10*3/uL (ref 1.7–7.7)
Neutrophils Relative %: 53 %
Platelets: 274 10*3/uL (ref 150–400)
RBC: 5 MIL/uL (ref 3.87–5.11)
RDW: 12.5 % (ref 11.5–15.5)
WBC: 10.4 10*3/uL (ref 4.0–10.5)
nRBC: 0 % (ref 0.0–0.2)

## 2022-06-21 LAB — BASIC METABOLIC PANEL
Anion gap: 9 (ref 5–15)
BUN: 17 mg/dL (ref 6–20)
CO2: 26 mmol/L (ref 22–32)
Calcium: 9.8 mg/dL (ref 8.9–10.3)
Chloride: 103 mmol/L (ref 98–111)
Creatinine, Ser: 0.63 mg/dL (ref 0.44–1.00)
GFR, Estimated: >60 mL/min (ref >60)
Glucose, Bld: 92 mg/dL (ref 70–99)
Potassium: 4.2 mmol/L (ref 3.5–5.1)
Sodium: 138 mmol/L (ref 135–145)

## 2022-06-21 LAB — TROPONIN I (HIGH SENSITIVITY): Troponin I (High Sensitivity): <2 ng/L (ref <18)

## 2022-06-21 LAB — RESP PANEL BY RT-PCR (RSV, FLU A&B, COVID)  RVPGX2
Influenza A by PCR: NEGATIVE
Influenza B by PCR: NEGATIVE
Resp Syncytial Virus by PCR: NEGATIVE
SARS Coronavirus 2 by RT PCR: NEGATIVE

## 2022-06-21 LAB — BRAIN NATRIURETIC PEPTIDE: B Natriuretic Peptide: 25.8 pg/mL (ref 0.0–100.0)

## 2022-06-21 LAB — LACTIC ACID, PLASMA: Lactic Acid, Venous: 1.4 mmol/L (ref 0.5–1.9)

## 2022-06-21 MED ORDER — DOXYCYCLINE HYCLATE 100 MG PO CAPS: 100.0000 mg | ORAL_CAPSULE | Freq: Two times a day (BID) | ORAL | 0 refills | Status: DC

## 2022-06-21 MED ORDER — SODIUM CHLORIDE (PF) 0.9 % IJ SOLN
INTRAMUSCULAR | Status: AC
  Filled 2022-06-21: qty 50

## 2022-06-21 MED ORDER — IOHEXOL 350 MG/ML SOLN
80.0000 mL | Freq: Once | INTRAVENOUS | Status: AC | PRN
  Administered 2022-06-21: 80 mL via INTRAVENOUS

## 2022-06-21 MED ORDER — PREDNISONE 20 MG PO TABS: 40.0000 mg | ORAL_TABLET | Freq: Every day | ORAL | 0 refills | Status: AC

## 2022-06-21 NOTE — ED Triage Notes (Signed)
Patient said she has been sick for 3 weeks with pneumonia. Finished antibiotics for it. When she coughs her mucus is clear in color. Stated she feels worse, no energy, left side hurts.

## 2022-06-21 NOTE — ED Notes (Signed)
Pt to xray via stretcher

## 2022-06-21 NOTE — ED Notes (Signed)
Pt endorses productive cough, clear thin sputum, and sob. Unsure of fever. Mentions intermittent sweats. Denies CP, but admits to some intermittent L side lateral lower rib pain with ambulation. Denies NVD. Reports no improvement with inhaler.

## 2022-06-21 NOTE — ED Provider Notes (Signed)
EMERGENCY DEPARTMENT AT [facility] Provider Note   CSN: [id number] Arrival date & time: 06/21/22  0800     History  Chief Complaint  Patient presents with   Shortness of Breath    Donna Norton is a 57 y.o. female.  Donna Norton is a 57 y.o. female with with a history of fibromyalgia, chronic cough, GERD, hypertension, arthritis, who presents to the emergency department for evaluation of ongoing cough, intermittent chest discomfort and shortness of breath.  Patient reports the symptoms have been ongoing for 3 weeks and she has been seen multiple times.  She was initially seen in the ED and had reassuring workup including negative CTA and was prescribed cough medication and discharged home.  Was referred to follow-up with cardiology and has also followed up with primary care, primary care felt that she most likely had an underlying viral infection.  Cardiology has scheduled her for CT coronary artery scan and echo for further evaluation given underlying cardiac risk.  She returns today reporting ongoing cough and increasing fatigue over the past few days.  Cough continues to be productive.  No fevers or chills.  She reports some intermittent shortness of breath and intermittent pain over the left side of her chest.  No other aggravating or alleviating factors.  The history is provided by the patient and medical records.  Shortness of Breath Associated symptoms: chest pain and cough   Associated symptoms: no abdominal pain, no fever, no headaches, no neck pain, no rash, no sore throat and no vomiting        Home Medications Prior to Admission medications   Medication Sig Start Date End Date Taking? Authorizing Provider  doxycycline (VIBRAMYCIN) 100 MG capsule Take 1 capsule (100 mg total) by mouth 2 (two) times daily. One po bid x 7 days 06/21/22  Yes Jacqlyn Larsen, PA-C  predniSONE (DELTASONE) 20 MG tablet Take 2 tablets (40 mg total) by mouth daily for 3  days. 06/21/22 06/24/22 Yes Jacqlyn Larsen, PA-C  acetaminophen (TYLENOL) 500 MG tablet Take 2 tablets (1,000 mg total) by mouth every 6 (six) hours as needed. 10/14/19   Christophe Louis, MD  albuterol [facility] HFA) [phone] Base) MCG/ACT inhaler Inhale 2 puffs into the lungs every 6 (six) hours as needed for wheezing or shortness of breath. 02/17/22   Allwardt, Randa Evens, PA-C  aspirin EC 81 MG tablet Take 1 tablet (81 mg total) by mouth daily. Swallow whole. 06/13/22   Early Osmond, MD  benzonatate (TESSALON) 100 MG capsule Take 1 capsule (100 mg total) by mouth every 8 (eight) hours. 05/30/22   Rex Kras, PA  fluticasone (FLONASE) 50 MCG/ACT nasal spray Place 2 sprays into both nostrils daily. 02/17/22   Allwardt, Randa Evens, PA-C  ibuprofen (ADVIL) 800 MG tablet Take 1 tablet (800 mg total) by mouth every 8 (eight) hours as needed for mild pain, moderate pain or cramping. 09/07/20   Christophe Louis, MD  isosorbide mononitrate (IMDUR) 30 MG 24 hr tablet Take 1 tablet (30 mg total) by mouth at bedtime. 06/13/22   Early Osmond, MD  meloxicam (MOBIC) 15 MG tablet Take 1 tablet (15 mg total) by mouth daily. 01/19/21   McDonald, Stephan Minister, DPM  metoprolol succinate (TOPROL XL) 25 MG 24 hr tablet Take 0.5 tablets (12.5 mg total) by mouth at bedtime. 06/13/22   Early Osmond, MD  metoprolol tartrate (LOPRESSOR) 100 MG tablet Take 1 tablet (100 mg total) by  mouth once for 1 dose. Take 90-120 minutes prior to scan. 06/13/22 06/13/22  Early Osmond, MD  Multiple Vitamins-Minerals (MULTIVITAMIN WITH MINERALS) tablet Take 1 tablet by mouth daily.    [provider]      Allergies    Atorvastatin, Ceclor [cefaclor], Crestor [rosuvastatin], Duloxetine, Hydrocodone bit-homatrop mbr, Methimazole, Percocet [oxycodone-acetaminophen], and Trazodone hcl    Review of Systems   Review of Systems  Constitutional:  Negative for chills and fever.  HENT:  Positive for congestion and rhinorrhea. Negative for sore throat.    Respiratory:  Positive for cough and shortness of breath.   Cardiovascular:  Positive for chest pain.  Gastrointestinal:  Negative for abdominal pain, diarrhea, nausea and vomiting.  Musculoskeletal:  Negative for myalgias, neck pain and neck stiffness.  Skin:  Negative for color change and rash.  Neurological:  Negative for headaches.  All other systems reviewed and are negative.   Physical Exam Updated Vital Signs BP (!) 161/108   Pulse 74   Temp 97.6 F (36.4 C) (Oral)   Resp 20   Ht '5\' 3"'$  (1.6 m)   Wt 94 kg   LMP 04/23/2009   SpO2 98%   BMI 36.71 kg/m  Physical Exam  ED Results / Procedures / Treatments   Labs (all labs ordered are listed, but only abnormal results are displayed) Labs Reviewed  CBC WITH DIFFERENTIAL/PLATELET - Abnormal; Notable for the following components:      Result Value   Hemoglobin 15.3 (*)    All other components within normal limits  RESP PANEL BY RT-PCR (RSV, FLU A&B, COVID)  RVPGX2  BASIC METABOLIC PANEL  BRAIN NATRIURETIC PEPTIDE  LACTIC ACID, PLASMA  TROPONIN I (HIGH SENSITIVITY)    EKG EKG Interpretation  Date/Time:  Thursday June 21 2022 08:07:11 EST Ventricular Rate:  73 PR Interval:  145 QRS Duration: 89 QT Interval:  355 QTC Calculation: 392 R Axis:   8 Text Interpretation: Sinus rhythm Baseline wander in lead(s) V1 V2 Confirmed by Cindee Lame [phone]) on 06/21/2022 1:07:29 PM  Radiology CT Angio Chest PE W and/or Wo Contrast  Result Date: 06/21/2022 CLINICAL DATA:  High probability for pulmonary embolus. Sick for 3 weeks. Pneumonia. Cough. No energy. EXAM: CT ANGIOGRAPHY CHEST WITH CONTRAST TECHNIQUE: Multidetector CT imaging of the chest was performed using the standard protocol during bolus administration of intravenous contrast. Multiplanar CT image reconstructions and MIPs were obtained to evaluate the vascular anatomy. RADIATION DOSE REDUCTION: This exam was performed according to the departmental  dose-optimization program which includes automated exposure control, adjustment of the mA and/or kV according to patient size and/or use of iterative reconstruction technique. CONTRAST:  35m OMNIPAQUE IOHEXOL 350 MG/ML SOLN COMPARISON:  05/30/2022 FINDINGS: Cardiovascular: Heart size is normal. There is no pericardial effusion. There is normal opacification of the pulmonary arteries. No acute pulmonary embolus. There is minimal focal atherosclerotic calcification of the thoracic aorta not associated with aneurysm. Mediastinum/Nodes: Esophagus is unremarkable. The visualized portion of the thyroid gland has a normal appearance. No significant mediastinal, hilar, mildly prominent bilateral axillary lymph nodes, likely benign. Lungs/Pleura: There are diffuse focal patchy lucent regions of the lungs, consistent with air trapping and small airways disease. No frank consolidations. There are no pleural effusions. The airways are unremarkable. No suspicious pulmonary nodules. Upper Abdomen: There are numerous hepatic cysts unchanged in appearance. Largest is identified in the LEFT hepatic lobe, 4.4 centimeters. No suspicious liver lesions. The remainder of the UPPER abdomen is unremarkable. Musculoskeletal: No chest wall  abnormality. No acute or significant osseous findings. Review of the MIP images confirms the above findings. IMPRESSION: 1. Technically adequate exam showing no acute pulmonary embolus. 2. Mild changes of small airways disease. 3. Stable hepatic cysts. Electronically Signed   By: Nolon Nations M.D.   On: 06/21/2022 12:09   DG Chest 2 View  Result Date: 06/21/2022 CLINICAL DATA:  CP, cough EXAM: CHEST - 2 VIEW COMPARISON:  05/30/2022 FINDINGS: Cardiomediastinal silhouette and pulmonary vasculature are within normal limits. Lungs are clear. IMPRESSION: No acute cardiopulmonary process. Electronically Signed   By: Miachel Roux M.D.   On: 06/21/2022 09:19    Procedures Procedures    Medications  Ordered in ED Medications  iohexol (OMNIPAQUE) 350 MG/ML injection 80 mL (80 mLs Intravenous Contrast Given 06/21/22 1130)  sodium chloride (PF) 0.9 % injection (  Given by Other 06/21/22 1203)    ED Course/ Medical Decision Making/ A&P                             Medical Decision Making  57 y.o. female presents to the ED with complaints of persistent cough with some associated chest pain and shortness of breath, this involves an extensive number of treatment options, and is a complaint that carries with it a high risk of complications and morbidity.  The differential diagnosis includes viral respiratory infection, bronchitis, pneumonia, PE, pleural effusion, pulmonary edema, ACS, musculoskeletal chest wall pain, pleurisy  On arrival pt is nontoxic, vitals stable, patient in no respiratory distress, satting well on room air.   Additional history obtained from chart review. Previous records obtained and reviewed    Lab Tests:  I Ordered, reviewed, and interpreted labs, which included: No leukocytosis, mildly elevated hemoglobin, no electrolyte derangements and normal renal function, troponin is not elevated and BNP is within normal limits, lactic acid is not elevated, RVP is negative  Imaging Studies ordered:  I ordered imaging studies which included chest x-ray and CT angio PE study, I independently visualized and interpreted imaging which showed no evidence of pneumonia, effusion or edema on chest x-ray.  CT PE study fortunately without evidence of pulmonary embolism but does show some signs of small airway disease.  ED Course:   Given 3 weeks of ongoing symptoms concern for bronchitis with small airway disease noted on CT, will treat with course of antibiotics and steroids and have patient follow-up with primary care closely if symptoms or not improving over the next 3 to 4 days.  Patient ambulated in the department and maintained normal O2 sats and work of breathing.  At this time there  does not appear to be any evidence of an acute emergency medical condition requiring further emergent evaluation and the patient appears stable for discharge with appropriate outpatient follow up. Diagnosis and return precautions discussed with patient who verbalizes understanding and is agreeable to discharge.     Portions of this note were generated with L[employer]. Dictation errors may occur despite best attempts at proofreading.         Final Clinical Impression(s) / ED Diagnoses Final diagnoses:  Bronchitis  Left-sided chest pain    Rx / DC Orders ED Discharge Orders          Ordered    doxycycline (VIBRAMYCIN) 100 MG capsule  2 times daily        06/21/22 1315    predniSONE (DELTASONE) 20 MG tablet  Daily  06/21/22 Donnelly, Lilliane Sposito N, PA-C 06/21/22 1450    Audley Hose, MD 06/21/22 1452

## 2022-06-21 NOTE — ED Notes (Signed)
Ambulatory to and from b/r, steady gait, alert, NAD, interactive, speaking in clear complete sentences.

## 2022-06-21 NOTE — ED Notes (Signed)
Pt ambulatory from w/r to exam room for triage. Alert, NAD, calm, resps e/u.

## 2022-06-21 NOTE — Discharge Instructions (Signed)
Your evaluation today was overall reassuring.  Based on your CT results it does look like you may have some underlying bronchitis or small airway disease.  Given persistent symptoms for a few weeks now please take prescribed antibiotics as well as 3 days of steroids to help reduce inflammation in the lungs.  Follow-up with your regular doctor closely if symptoms are still not improving.  If you develop fevers, worsening chest pain or shortness of breath or other new or concerning symptoms please return for reevaluation.

## 2022-06-22 ENCOUNTER — Telehealth (HOSPITAL_COMMUNITY): Payer: Self-pay | Admitting: *Deleted

## 2022-06-22 NOTE — Telephone Encounter (Signed)
Reaching out to patient to offer assistance regarding upcoming cardiac imaging study; pt verbalizes understanding of appt date/time, parking situation and where to check in, pre-test NPO status and medications ordered, and verified current allergies; name and call back number provided for further questions should they arise  Gordy Clement RN Navigator Cardiac Imaging [facility] [phone] office [phone] cell  Patient to take '100mg'$  metoprolol tartrate two hours prior to her cardiac CT scan. She is aware to arrive at 3:30pm.

## 2022-06-25 ENCOUNTER — Ambulatory Visit (HOSPITAL_COMMUNITY)
Admission: RE | Admit: 2022-06-25 | Discharge: 2022-06-25 | Disposition: A | Discharge status: Home / Self Care | Payer: BC Managed Care – PPO | Source: Ambulatory Visit | Attending: Internal Medicine | Admitting: Internal Medicine

## 2022-06-25 DIAGNOSIS — R072 Precordial pain: Secondary | ICD-10-CM | POA: Diagnosis present

## 2022-06-25 MED ORDER — IOHEXOL 350 MG/ML SOLN
95.0000 mL | Freq: Once | INTRAVENOUS | Status: AC | PRN
  Administered 2022-06-25: 95 mL via INTRAVENOUS

## 2022-06-25 MED ORDER — NITROGLYCERIN 0.4 MG SL SUBL
0.8000 mg | SUBLINGUAL_TABLET | Freq: Once | SUBLINGUAL | Status: AC
  Administered 2022-06-25: 0.8 mg via SUBLINGUAL

## 2022-06-25 MED ORDER — NITROGLYCERIN 0.4 MG SL SUBL
SUBLINGUAL_TABLET | SUBLINGUAL | Status: AC
  Filled 2022-06-25: qty 2

## 2022-07-09 ENCOUNTER — Ambulatory Visit (HOSPITAL_COMMUNITY): Payer: BC Managed Care – PPO | Attending: Internal Medicine

## 2022-07-09 DIAGNOSIS — R072 Precordial pain: Secondary | ICD-10-CM | POA: Diagnosis not present

## 2022-07-09 DIAGNOSIS — R0609 Other forms of dyspnea: Secondary | ICD-10-CM | POA: Insufficient documentation

## 2022-07-09 LAB — ECHOCARDIOGRAM COMPLETE
Area-P 1/2: 4.36 cm2
S' Lateral: 3.1 cm

## 2022-07-16 ENCOUNTER — Encounter: Payer: Self-pay | Admitting: Family Medicine

## 2022-07-16 ENCOUNTER — Ambulatory Visit (INDEPENDENT_AMBULATORY_CARE_PROVIDER_SITE_OTHER): Payer: Medicaid Other | Admitting: Family Medicine

## 2022-07-16 VITALS — BP 121/82 | HR 99 | Ht 63.0 in | Wt 206.5 lb

## 2022-07-16 DIAGNOSIS — R070 Pain in throat: Secondary | ICD-10-CM | POA: Insufficient documentation

## 2022-07-16 DIAGNOSIS — B370 Candidal stomatitis: Secondary | ICD-10-CM | POA: Diagnosis not present

## 2022-07-16 MED ORDER — NYSTATIN 100000 UNIT/ML MT SUSP: 200000.0000 [IU] | Freq: Four times a day (QID) | OROMUCOSAL | 1 refills | Status: AC

## 2022-07-16 NOTE — Assessment & Plan Note (Signed)
-  likely secondary to oral candida, prescribed nystatin and discussed med use  -other differentials considered include strep pharyngitis, epiglotitis or abscess but very low to no suspicion of this given history and exam -of note, she is only on albuterol prn for her history of asthma but I discussed that if she seems to require maintenance therapy in the future then she will need to rinse with water after each use of ICS to prevent further fungal infections. -strict return and ED precautions discussed  -follow up with PCP as appropriate

## 2022-07-16 NOTE — Patient Instructions (Addendum)
It was great seeing you today!  Today we discussed your symptoms, it seems that you have an oral fungal infection. I have prescribed nystatin, please use this 4 times a day as we discussed for no more than 7 days.   If you notice redness, pain, unable to swallow or no improvement then please return for another evaluation. If you experience any difficulty breathing then please go to the emergency department.   Please follow up at your next scheduled appointment, if anything arises between now and then, please don't hesitate to contact our office.   Thank you for allowing [place] to be a part of your medical care!  Thank you, Dr. Larae Grooms  Also a reminder of our clinic's no-show policy. Please make sure to arrive at least 15 minutes prior to your scheduled appointment time. Please try to cancel before 24 hours if you are not able to make it. If you no-show for 2 appointments then you will be receiving a warning letter. If you no-show after 3 visits, then you may be at risk of being dismissed from our clinic. This is to ensure that everyone is able to be seen in a timely manner. Thank you, we appreciate your assistance with this!

## 2022-07-16 NOTE — Progress Notes (Signed)
    SUBJECTIVE:   CHIEF COMPLAINT / HPI:   Patient presents with white patches in her mouth that she noticed last night. She also noticed that her glands were swollen. Hurts sometimes when she swallows. She has never had this before or it was not this visible. 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 She wears her mask at school. Denies fever and chills.Denies associated pain. Reports irritability with sleeping last night and myalgias. She admits that she has been more active with preparing for her camping trip and thinks her myalgias are normal are from preparing for this trip that caused her to have leg pain.   OBJECTIVE:   BP 121/82   Pulse 99   Ht 5\' 3"  (1.6 m)   Wt 206 lb 8 oz (93.7 kg)   LMP 04/23/2009   SpO2 100%   BMI 36.58 kg/m   General: Patient well-appearing, in no acute distress. HEENT: no tonsillar erythema or exudate noted, white patches along buccal mucosa with left>right, nonbulging TM bilaterally without erythema or drainage noted, presence of very minimal anterior cervical LAD, no posterior cervical LAD CV: RRR, no murmurs or gallops auscultated  Resp: CTAB, no wheezing, rales or rhonchi noted, speaking in full sentences without signs of respiratory distress  ASSESSMENT/PLAN:   Throat discomfort -likely secondary to oral candida, prescribed nystatin and discussed med use  -other differentials considered include strep pharyngitis, epiglotitis or abscess but very low to no suspicion of this given history and exam -of note, she is only on albuterol prn for her history of asthma but I discussed that if she seems to require maintenance therapy in the future then she will need to rinse with water after each use of ICS to prevent further fungal infections. -strict return and ED precautions discussed  -follow up with PCP as appropriate    -PHQ-9 score of 7 with negative question 9 reviewed.    Donney Dice, [place]

## 2022-07-24 ENCOUNTER — Ambulatory Visit: Payer: BC Managed Care – PPO | Attending: Internal Medicine | Admitting: Pharmacist

## 2022-07-24 DIAGNOSIS — E785 Hyperlipidemia, unspecified: Secondary | ICD-10-CM

## 2022-07-24 DIAGNOSIS — T466X5A Adverse effect of antihyperlipidemic and antiarteriosclerotic drugs, initial encounter: Secondary | ICD-10-CM

## 2022-07-24 DIAGNOSIS — G720 Drug-induced myopathy: Secondary | ICD-10-CM | POA: Diagnosis not present

## 2022-07-24 MED ORDER — ROSUVASTATIN CALCIUM 5 MG PO TABS: 2.5000 mg | ORAL_TABLET | Freq: Every day | ORAL | 3 refills | Status: DC

## 2022-07-24 NOTE — Assessment & Plan Note (Addendum)
Assessment: LDL-C is above goal of <70 given her elevated LPa and family hx despite her 0 CAC score Discussed PCSK9i, re-challenge with rosuvastatin 2.5mg  three times a week or pravastatin, leqvio with patient Reviewed diet, encouraged fiber and protein. Does not eat much saturated fats. Watch sugars in honey, granolla, juice  Plan: Patient would like to re-try rosuvastaitn 2.5mg  daily Recheck lipids in 3 months Increase exercise as tolerated She has not been taking ASA 81mg , but after discussion she has decided to start

## 2022-07-24 NOTE — Progress Notes (Unsigned)
Patient ID: Donna Norton                 DOB: May 20, 1965                    MRN: [id number]      HPI: Donna Norton is a 57 y.o. female patient referred to lipid clinic by  Dr. Ali Lowe. PMH is significant for T2DM, HLD, Graves disease, obesity and check pain. Patient has been intolerant to rosuvastatin and atorvastatin. LPa 312. Coronary calcium score =0, mild non-calcified plaque.   Patient presents today to lipid clinic. She reports shoulder pain- in 1 shoulder with atorvastatin and rosuvastatin. Atorvastatin was the worst, followed by rosuvastatin 5mg . Rosuvastatin 2.5mg  was not as bad.  Recently she has been feeling very poorly. Pain in her back and side. Breathing is off. When the exercises she feels so sore and tired the next day. Has been sick several times. Feels like her immune system is down. 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 Feels like she can't take care of her Mother, but her family is not understanding. Feeling stressed. Trying to improve her diet. Has been tracking her BP at home. Typically around 128/84. Gets some headaches. Has been taking IBU. Asked her to use tylenol instead.   Reviewed options for lowering LDL cholesterol, including ezetimibe, PCSK-9 inhibitors, bempedoic acid and inclisiran.  Discussed mechanisms of action, dosing, side effects and potential decreases in LDL cholesterol.  Also reviewed cost information and potential options for patient assistance.   Current Medications: none Intolerances: rosuvastatin 2.5mg  daily, rosuvastatin 5mg , atorvastatin (pain in shoulder) Risk Factors: elevated LPa, family hx LDL-C goal: <70 ApoB goal: <80  Diet: no beef, no pork, little sugar Breakfast: coffee w/ little honey and creamer, juice, yogurt and granolla Lunch: ground [place], black beans Dinner: if she isnt hungry she will eat cereal  Drinks: water, zero sugar soda  Exercise: walking, wants to ride her bike  Family History:  Family  History  Problem Relation Age of Onset   Hypertension Mother    Thyroid disease Mother    Heart disease Father        5 stents   Hypertension Father    Diabetes Father    Cancer - Cervical Sister    Allergies Child    Asthma Grandchild     Social History: no tobacco, no ETOH  Labs: Lipid Panel     Component Value Date/Time   CHOL 240 (H) 06/07/2022 1654   TRIG 137 06/07/2022 1654   HDL 49 06/07/2022 1654   CHOLHDL 4.9 (H) 06/07/2022 1654   LDLCALC 166 (H) 06/07/2022 1654   LABVLDL 25 06/07/2022 1654    Past Medical History:  Diagnosis Date   Anxiety    Arthritis    ANKLES AND FEET   Chronic cough since 2020 covid   Chronic headache    COVID 03-2019 AND 08-09-2020 RAPID HOME TEST   03-2019 SYMPTOMS X 6 MONTHS   Dyspnea    WITH EXERTION @TIMES  and talking with left lower lung per pt   Fibromyalgia    Generalized weakness    GERD (gastroesophageal reflux disease)    diet controlled   Hypertension    currently no meds, controlled, checks at home   Meningitis 2003 X 2   VIRAL   Pain    LEFT  SIDE HIP KNEE SHOULDER  and abdominal pain at times   Paresthesias    chronic   PNA (  pneumonia) 03/2019   WITH COVID   Post-COVID syndrome    Sleep paralysis    OCC TROUBLE GETTING UP WHEN WAKES UP, PT CALLS THIS SLEEP PALSY   Spinal headache 2003   AFTER SPINAL TAP WITH MENIGITIS 2003   Thyroid disease 2011-RESOLVED   HYPERTHRYOID    Current Outpatient Medications on File Prior to Visit  Medication Sig Dispense Refill   acetaminophen (TYLENOL) 500 MG tablet Take 2 tablets (1,000 mg total) by mouth every 6 (six) hours as needed. 30 tablet 0   albuterol (PROAIR HFA) 108 (90 Base) MCG/ACT inhaler Inhale 2 puffs into the lungs every 6 (six) hours as needed for wheezing or shortness of breath. 1 each 2   fluticasone (FLONASE) 50 MCG/ACT nasal spray Place 2 sprays into both nostrils daily. 16 g 3   aspirin EC 81 MG tablet Take 1 tablet (81 mg total) by mouth daily. Swallow  whole. (Patient not taking: Reported on 07/24/2022) 90 tablet 3   isosorbide mononitrate (IMDUR) 30 MG 24 hr tablet Take 1 tablet (30 mg total) by mouth at bedtime. (Patient not taking: Reported on 07/24/2022) 90 tablet 3   metoprolol succinate (TOPROL XL) 25 MG 24 hr tablet Take 0.5 tablets (12.5 mg total) by mouth at bedtime. (Patient not taking: Reported on 07/24/2022) 45 tablet 3   metoprolol tartrate (LOPRESSOR) 100 MG tablet Take 1 tablet (100 mg total) by mouth once for 1 dose. Take 90-120 minutes prior to scan. 1 tablet 0   Multiple Vitamins-Minerals (MULTIVITAMIN WITH MINERALS) tablet Take 1 tablet by mouth daily. (Patient not taking: Reported on 07/24/2022)     No current facility-administered medications on file prior to visit.    Allergies  Allergen Reactions   Atorvastatin     Other reaction(s): shoulder pain   Ceclor [Cefaclor] Hives   Crestor [Rosuvastatin] Other (See Comments)    myalgias   Duloxetine     Other reaction(s): pulling her down" and decreased appetitie, MADE SUICIDAL   Hydrocodone Bit-Homatrop Mbr     Other reaction(s): headaches/ n/v   Methimazole Other (See Comments)    Liver ENZYMES RASIED    Percocet [Oxycodone-Acetaminophen] Hives    CAN TAKE OXYCOONE CANNOT TAKE PERCOCET   Trazodone Hcl     Other reaction(s): dizziness    Assessment/Plan:  1. Hyperlipidemia -  Hyperlipidemia Assessment: LDL-C is above goal of <70 given her elevated LPa and family hx despite her 0 CAC score Discussed PCSK9i, re-challenge with rosuvastatin 2.5mg  three times a week or pravastatin, leqvio with patient Reviewed diet, encouraged fiber and protein. Does not eat much saturated fats. Watch sugars in honey, granolla, juice  Plan: Patient would like to re-try rosuvastaitn 2.5mg  daily Recheck lipids in 3 months Increase exercise as tolerated She has not been taking ASA 81mg , but after discussion she has decided to start    Thank you,  Ramond Dial, Pharm.D, BCPS,  CPP [facility] of [facility] [address], [place], [place] [address]  Phone: [phone]; Fax: [phone]

## 2022-07-24 NOTE — Patient Instructions (Addendum)
Please start taking rosuvastatin 2.5mg  daily Try to increase your exercise as much as you can Fasting Lab work on 6/28   Please call me at [phone] with any issues  Adopting a Healthy Lifestyle.   Weight: Know what a healthy weight is for you (roughly BMI <25) and aim to maintain this. You can calculate your body mass index on your smart phone  Diet: Aim for 7+ servings of fruits and vegetables daily Limit animal fats in diet for cholesterol and heart health - choose grass fed whenever available Avoid highly processed foods (fast food burgers, tacos, fried chicken, pizza, hot dogs, french fries)  Saturated fat comes in the form of butter, lard, coconut oil, margarine, partially hydrogenated oils, and fat in meat. These increase your risk of cardiovascular disease.  Use healthy plant oils, such as olive, canola, soy, corn, sunflower and peanut.  Whole foods such as fruits, vegetables and whole grains have fiber  Men need > 38 grams of fiber per day Women need > 25 grams of fiber per day  Load up on vegetables and fruits - one-half of your plate: Aim for color and variety, and remember that potatoes dont count. Go for whole grains - one-quarter of your plate: Whole wheat, barley, wheat berries, quinoa, oats, brown rice, and foods made with them. If you want pasta, go with whole wheat pasta. Protein power - one-quarter of your plate: Fish, chicken, beans, and nuts are all healthy, versatile protein sources. Limit red meat. You need carbohydrates for energy! The type of carbohydrate is more important than the amount. Choose carbohydrates such as vegetables, fruits, whole grains, beans, and nuts in the place of white rice, white pasta, potatoes (baked or fried), macaroni and cheese, cakes, cookies, and donuts.  If youre thirsty, drink water. Coffee and tea are good in moderation, but skip sugary drinks and limit milk and dairy products to one or two daily servings. Keep sugar intake at 6  teaspoons or 24 grams or LESS       Exercise: Aim for 150 min of moderate intensity exercise weekly for heart health, and weights twice weekly for bone health Stay active - any steps are better than no steps! Aim for 7-9 hours of sleep daily

## 2022-10-18 NOTE — Progress Notes (Signed)
Cardiology Office Note:    Date:  10/26/2022   ID:  Donna Norton, DOB 07-15-1965, MRN [id number]  PCP:  Vonna Drafts, MD   [facility] Providers Cardiologist:  Alverda Skeans, MD Referring MD: Vonna Drafts, MD   Chief Complaint/Reason for Referral:  Cardiology follow up  PATIENT DID NOT APPEAR FOR APPOINTMENT   ASSESSMENT:    1. Mild CAD   2. Dyspnea on exertion   3. Prediabetes   4. Elevated lipoprotein(a)   5. BMI 36.0-36.9,adult   6. Snoring     PLAN:    In order of problems listed above: Coronary artery disease: Mild on coronary CTA.  Will stop Imdur, continue aspirin and statin. Dyspnea: Will consider empiric Lasix. Prediabetes: Continue current therapy. Hyperlipidemia: Appreciate pharmacy recommendations; lipid panel to be drawn in the near future to assess response with retrial Crestor. Elevated BMI: Not a candidate for GLP-1 receptor agonist given low hemoglobin A1c Snoring: Will refer for sleep study          History of Present Illness:    FOCUSED PROBLEM LIST:   T2DM, diet controlled Hyperlipidemia, intolerant of atorvastatin and crestor due to myalgias; highly elevated LP(a) Graves disease BMI 36 Minimal CAD on coronary CTA 2024   The patient is a 57 y.o. female with the indicated medical history here for recommendations regarding chest pain and dyspnea.  The patient was seen by her PCP recently with these complaints.  An EKG, troponins and other laboratories were reassuring; her D-Dimer was elevated and a LE doppler was negative.   The patient is here today and reports chest discomfort, chest fullness on recumbency, and shortness of breath.  This happens with exertion and resolves with rest.  She denies any symptoms at rest.  She denies any bleeding or bruising.  She does snore and because of problems with sleep hygiene is right now reluctant to get a sleep apnea evaluation.  She did have some peripheral edema recently but it seems to be better  now that she has been off her feet for some time.  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  She fortunately has not required any emergency room visits or hospitalizations.   She does not smoke.  Plan:  Start Imdur 30, Toprol 12.5, and ASA; obtain coronary CTA and TTE; consider Jardiance in the future; refer to pharmacy for lipid and weight management.  Consider empiric Lasix for shortness of breath depending on testing.  Today: In the interim the patient had a coronary CTA which showed minimal obstructive coronary artery disease and an echocardiogram with preserved LV function with no significant valvular abnormalities.  She was seen by pharmacy and she decided to retrial Crestor.  Repeat lipids are pending.       Previous Medical History: Past Medical History:  Diagnosis Date   Anxiety    Arthritis    ANKLES AND FEET   Chronic cough since 2020 covid   Chronic headache    COVID 03-2019 AND 08-09-2020 RAPID HOME TEST   03-2019 SYMPTOMS X 6 MONTHS   Dyspnea    WITH EXERTION @TIMES  and talking with left lower lung per pt   Fibromyalgia    Generalized weakness    GERD (gastroesophageal reflux disease)    diet controlled   Hypertension    currently no meds, controlled, checks at home   Meningitis 2003 X 2   VIRAL   Pain    LEFT  SIDE HIP KNEE SHOULDER  and abdominal pain at times   Paresthesias    chronic   PNA (pneumonia) 03/2019   WITH COVID   Post-COVID syndrome    Sleep paralysis    OCC TROUBLE GETTING UP WHEN WAKES UP, PT CALLS THIS SLEEP PALSY   Spinal headache 2003   AFTER SPINAL TAP WITH MENIGITIS 2003   Thyroid disease 2011-RESOLVED   HYPERTHRYOID     Current Medications: No outpatient medications have been marked as taking for the 10/26/22 encounter (Office Visit) with Orbie Pyo, MD.     Allergies:    Atorvastatin, Ceclor [cefaclor], Crestor [rosuvastatin], Duloxetine, Hydrocodone bit-homatrop mbr, Methimazole, Percocet  [oxycodone-acetaminophen], and Trazodone hcl   Social History:   Social History   Tobacco Use   Smoking status: Never   Smokeless tobacco: Never  Vaping Use   Vaping Use: Never used  Substance Use Topics   Alcohol use: Not Currently   Drug use: No     Family Hx: Family History  Problem Relation Age of Onset   Hypertension Mother    Thyroid disease Mother    Heart disease Father        5 stents   Hypertension Father    Diabetes Father    Cancer - Cervical Sister    Allergies Child    Asthma Grandchild      Review of Systems:   Please see the history of present illness.    All other systems reviewed and are negative.     EKGs/Labs/Other Test Reviewed:    EKG:    EKG Interpretation Date/Time:    Ventricular Rate:    PR Interval:    QRS Duration:    QT Interval:    QTC Calculation:   R Axis:      Text Interpretation:           Prior CV studies:  Cardiac Studies & Procedures       ECHOCARDIOGRAM  ECHOCARDIOGRAM COMPLETE 07/09/2022  Narrative ECHOCARDIOGRAM REPORT    Patient Name:   Donna VONBEHREN Date of Exam: 07/09/2022 Medical Rec #:  [id number]       Height:       63.0 in Accession #:    [id number]      Weight:       207.2 lb Date of Birth:  06-09-1965       BSA:          1.963 m Patient Age:    57 years        BP:           141/97 mmHg Patient Gender: F               HR:           58 bpm. Exam Location:  [address]  Procedure: 2D Echo, 3D Echo, Cardiac Doppler and Color Doppler  Indications:    R07.9 Chest Pain  History:        Patient has no prior history of Echocardiogram examinations. Signs/Symptoms:Dyspnea; Risk Factors:HLD. Prediabetes.  Sonographer:    Clearence Ped RCS Referring Phys: [id number] Orbie Pyo  IMPRESSIONS   1. Left ventricular ejection fraction, by estimation, is 55 to 60%. The left ventricle has normal function. The left ventricle has no regional wall motion abnormalities. Left ventricular diastolic  parameters were normal. 2. Right ventricular systolic function is normal. The right ventricular size is normal. There is normal pulmonary artery systolic pressure. The estimated right ventricular systolic pressure is 27.5 mmHg. 3. The  mitral valve is normal in structure. Trivial mitral valve regurgitation. No evidence of mitral stenosis. 4. The aortic valve is tricuspid. There is mild calcification of the aortic annulus. Aortic valve regurgitation is not visualized. No aortic stenosis is present. 5. The inferior vena cava is dilated in size with >50% respiratory variability, suggesting right atrial pressure of 8 mmHg.  FINDINGS Left Ventricle: Left ventricular ejection fraction, by estimation, is 55 to 60%. The left ventricle has normal function. The left ventricle has no regional wall motion abnormalities. 3D ejection fraction reviewed and evaluated as part of the interpretation. Alternate measurement of EF is felt to be most reflective of LV function. The left ventricular internal cavity size was normal in size. There is no left ventricular hypertrophy. Left ventricular diastolic parameters were normal.  Right Ventricle: The right ventricular size is normal. No increase in right ventricular wall thickness. Right ventricular systolic function is normal. There is normal pulmonary artery systolic pressure. The tricuspid regurgitant velocity is 2.21 m/s, and with an assumed right atrial pressure of 8 mmHg, the estimated right ventricular systolic pressure is 27.5 mmHg.  Left Atrium: Left atrial size was normal in size.  Right Atrium: Right atrial size was normal in size.  Pericardium: There is no evidence of pericardial effusion.  Mitral Valve: The mitral valve is normal in structure. Trivial mitral valve regurgitation. No evidence of mitral valve stenosis.  Tricuspid Valve: The tricuspid valve is normal in structure. Tricuspid valve regurgitation is trivial. No evidence of tricuspid  stenosis.  Aortic Valve: The aortic valve is tricuspid. There is mild calcification of the aortic annulus. Aortic valve regurgitation is not visualized. No aortic stenosis is present.  Pulmonic Valve: The pulmonic valve was normal in structure. Pulmonic valve regurgitation is trivial. No evidence of pulmonic stenosis.  Aorta: The aortic root is normal in size and structure.  Venous: The inferior vena cava is dilated in size with greater than 50% respiratory variability, suggesting right atrial pressure of 8 mmHg.  IAS/Shunts: No atrial level shunt detected by color flow Doppler.   LEFT VENTRICLE PLAX 2D LVIDd:         4.40 cm   Diastology LVIDs:         3.10 cm   LV e' medial:    8.49 cm/s LV PW:         1.20 cm   LV E/e' medial:  9.9 LV IVS:        0.90 cm   LV e' lateral:   12.30 cm/s LVOT diam:     2.00 cm   LV E/e' lateral: 6.9 LV SV:         66 LV SV Index:   33 LVOT Area:     3.14 cm  3D Volume EF: 3D EF:        53 % LV EDV:       108 ml LV ESV:       51 ml LV SV:        57 ml  RIGHT VENTRICLE RV Basal diam:  3.70 cm RV S prime:     8.27 cm/s TAPSE (M-mode): 2.4 cm RVSP:           22.5 mmHg  LEFT ATRIUM             Index        RIGHT ATRIUM           Index LA diam:        3.90 cm 1.99  cm/m   RA Pressure: 3.00 mmHg LA Vol (A2C):   46.4 ml 23.64 ml/m  RA Area:     15.10 cm LA Vol (A4C):   35.1 ml 17.88 ml/m  RA Volume:   40.70 ml  20.73 ml/m LA Biplane Vol: 40.7 ml 20.73 ml/m AORTIC VALVE LVOT Vmax:   84.60 cm/s LVOT Vmean:  54.300 cm/s LVOT VTI:    0.209 m  AORTA Ao Root diam: 3.10 cm Ao Asc diam:  3.40 cm  MITRAL VALVE               TRICUSPID VALVE MV Area (PHT):             TR Peak grad:   19.5 mmHg MV Decel Time:             TR Vmax:        221.00 cm/s MV E velocity: 84.40 cm/s  Estimated RAP:  3.00 mmHg MV A velocity: 62.90 cm/s  RVSP:           22.5 mmHg MV E/A ratio:  1.34 SHUNTS Systemic VTI:  0.21 m Systemic Diam: 2.00 cm  Weston Brass MD Electronically signed by Weston Brass MD Signature Date/Time: 07/09/2022/8:50:47 PM    Final    MONITORS  LONG TERM MONITOR (3-14 DAYS) 08/12/2020  Narrative  Sinus rhythm with average heart rate of 83 bpm. (Appropriate increase and decrease throught daily cycle)  Rare PVC's and PAC's  No atrial fibrillation, no pauses  Symptoms reported (palpitations, chest pain) were associated with normal rhythm  Reassuring monitor Donato Schultz, MD   CT SCANS  CT CORONARY MORPH W/CTA COR W/SCORE 06/26/2022  Addendum 06/26/2022 10:09 PM ADDENDUM REPORT: 06/26/2022 22:06  EXAM: OVER-READ INTERPRETATION  CT CHEST  The following report is an over-read performed by radiologist Dr. Noe Gens [facility] on 06/26/2022. This over-read does not include interpretation of cardiac or coronary anatomy or pathology. The coronary CTA interpretation by the cardiologist is attached.  COMPARISON:  06/21/2022  FINDINGS: Heart is normal size. Aorta normal caliber. No adenopathy. No confluent opacities or effusions. Scattered low-density lesions in the liver most compatible with cysts. No acute findings in the upper abdomen. Chest wall soft tissues are unremarkable. No acute bony abnormality.  IMPRESSION: No acute extra cardiac abnormality.   Electronically Signed By: Charlett Nose M.D. On: 06/26/2022 22:06  Narrative HISTORY: 57 yo female with chest pain, nonspecific  EXAM: Cardiac/Coronary CTA  TECHNIQUE: The patient was scanned on a [employer].  PROTOCOL: A 120 kV prospective scan was triggered in the descending thoracic aorta at 111 HU's. Axial non-contrast 3 mm slices were carried out through the heart. The data set was analyzed on a dedicated work station and scored using the Agatson method. Gantry rotation speed was 250 msecs and collimation was .6 mm. Beta blockade and 0.8 mg of sl NTG was given. The 3D data set was reconstructed in 5%  intervals of the 35-75 % of the R-R cycle. Diastolic phases were analyzed on a dedicated work station using MPR, MIP and VRT modes. The patient received 95mL OMNIPAQUE IOHEXOL 350 MG/ML SOLN of contrast.  FINDINGS: Quality: Good, HR 64, attenuation artifact  Coronary calcium score: The patient's coronary artery calcium score is 0, which places the patient in the 0 percentile.  Coronary arteries: Normal coronary origins.  Right dominance.  Right Coronary Artery: Dominant. Mild 25-49% ostial non-calcified stenosis.  Left Main Coronary Artery: Normal. Bifurcates into the LAD and LCx  arteries.  Left Anterior Descending Coronary Artery: Anterior artery that wraps around the apex. There is no significant disease. 2 small diagonal branches without disease.  Left Circumflex Artery: AV groove vessel - minimal proximal non-calcified stenosis (1-24%).  Aorta: Normal size, 30 mm at the mid ascending aorta (level of the PA bifurcation) measured double oblique. No calcifications. No dissection.  Aortic Valve: Trileaflet. No calcifications.  Other findings:  Normal pulmonary vein drainage into the left atrium.  Normal left atrial appendage without a thrombus.  Normal size of the pulmonary artery.  IMPRESSION: 1. Minimal to mild non-calcified CAD, CADRADS = 2.  2. Coronary calcium score of 0. This was 0 percentile for age and sex matched control.  3. Normal coronary origin with right dominance.  4. Cardiovascular risk factor modification is recommended.  Electronically Signed: By: Chrystie Nose M.D. On: 06/25/2022 19:13          Other studies Reviewed: Review of the additional studies/records demonstrates: Imaging studies reviewed do not demonstrate aortic atherosclerosis or coronary artery calcification   Recent Labs: 06/07/2022: TSH 4.590 06/21/2022: B Natriuretic Peptide 25.8; BUN 17; Creatinine, Ser 0.63; Hemoglobin 15.3; Platelets 274; Potassium 4.2; Sodium  138 10/19/2022: ALT 15   Recent Lipid Panel Lab Results  Component Value Date/Time   CHOL 163 10/19/2022 07:39 AM   TRIG 86 10/19/2022 07:39 AM   HDL 53 10/19/2022 07:39 AM   LDLCALC 94 10/19/2022 07:39 AM    Risk Assessment/Calculations:            Physical Exam:     Signed, Orbie Pyo, MD  10/26/2022 11:02 AM    [facility] [address] [place], [place], [place]  [address] Phone: [phone]; Fax: [phone]   Note:  This document was prepared using Dragon voice recognition software and may include unintentional dictation errors.

## 2022-10-19 ENCOUNTER — Ambulatory Visit: Payer: BC Managed Care – PPO | Attending: Internal Medicine

## 2022-10-19 DIAGNOSIS — E785 Hyperlipidemia, unspecified: Secondary | ICD-10-CM

## 2022-10-20 LAB — HEPATIC FUNCTION PANEL
ALT: 15 IU/L (ref 0–32)
AST: 19 IU/L (ref 0–40)
Albumin: 4.3 g/dL (ref 3.8–4.9)
Alkaline Phosphatase: 68 IU/L (ref 44–121)
Bilirubin Total: 0.8 mg/dL (ref 0.0–1.2)
Bilirubin, Direct: 0.21 mg/dL (ref 0.00–0.40)
Total Protein: 7.1 g/dL (ref 6.0–8.5)

## 2022-10-20 LAB — LIPID PANEL
Chol/HDL Ratio: 3.1 ratio (ref 0.0–4.4)
Cholesterol, Total: 163 mg/dL (ref 100–199)
HDL: 53 mg/dL (ref >39)
LDL Chol Calc (NIH): 94 mg/dL (ref 0–99)
Triglycerides: 86 mg/dL (ref 0–149)
VLDL Cholesterol Cal: 16 mg/dL (ref 5–40)

## 2022-10-26 ENCOUNTER — Ambulatory Visit: Payer: BC Managed Care – PPO | Attending: Internal Medicine | Admitting: Internal Medicine

## 2022-10-26 DIAGNOSIS — Z6836 Body mass index (BMI) 36.0-36.9, adult: Secondary | ICD-10-CM

## 2022-10-26 DIAGNOSIS — E7841 Elevated Lipoprotein(a): Secondary | ICD-10-CM

## 2022-10-26 DIAGNOSIS — R7303 Prediabetes: Secondary | ICD-10-CM

## 2022-10-26 DIAGNOSIS — R0609 Other forms of dyspnea: Secondary | ICD-10-CM

## 2022-10-26 DIAGNOSIS — I2510 Atherosclerotic heart disease of native coronary artery without angina pectoris: Secondary | ICD-10-CM

## 2022-10-26 DIAGNOSIS — R0683 Snoring: Secondary | ICD-10-CM

## 2023-01-09 ENCOUNTER — Ambulatory Visit: Payer: BC Managed Care – PPO | Admitting: Family Medicine

## 2023-04-02 ENCOUNTER — Ambulatory Visit: Payer: BC Managed Care – PPO | Admitting: Family Medicine

## 2023-04-04 ENCOUNTER — Ambulatory Visit (INDEPENDENT_AMBULATORY_CARE_PROVIDER_SITE_OTHER): Payer: BLUE CROSS/BLUE SHIELD | Admitting: Family Medicine

## 2023-04-04 ENCOUNTER — Encounter: Payer: Self-pay | Admitting: Family Medicine

## 2023-04-04 VITALS — BP 121/83 | HR 81 | Ht 63.0 in | Wt 214.6 lb

## 2023-04-04 DIAGNOSIS — I2510 Atherosclerotic heart disease of native coronary artery without angina pectoris: Secondary | ICD-10-CM | POA: Diagnosis not present

## 2023-04-04 DIAGNOSIS — R7303 Prediabetes: Secondary | ICD-10-CM

## 2023-04-04 DIAGNOSIS — E785 Hyperlipidemia, unspecified: Secondary | ICD-10-CM

## 2023-04-04 DIAGNOSIS — E039 Hypothyroidism, unspecified: Secondary | ICD-10-CM

## 2023-04-04 LAB — POCT GLYCOSYLATED HEMOGLOBIN (HGB A1C): HbA1c, POC (prediabetic range): 5.7 % (ref 5.7–6.4)

## 2023-04-04 NOTE — Progress Notes (Signed)
    SUBJECTIVE:   CHIEF COMPLAINT / HPI:   Donna Norton is a 57yo F w/ hx of thyroid dysfunction, HLD, HTN, CAD prediabetes that presents for blood pressure follow-up.  HTN - Not taking meds because she has been busy and ran out and haven't thought to go pick it up. Has been off these meds for past 2 weeks. - Last week, started to have sharp L sided chest pain, occasionally has dyspnea.  - Also had a lapse in insurance, but got insurance again, which is why she waited to come in today.  - Not taking imdur, metoprolol, ASA   HLD - She is taking her crestor, but misses some doses. Takes about 4-5 doses a week. She is tolerating the 5mg  tablet, no longer having the shoulder myalgia that she had in the past so she is happy to continue 5mg  dose.  - She was supposed to take a half pill, but took the full pill of crestor instead.   - Has a follow-up with Cardiologist in January   OBJECTIVE:   BP 121/83   Pulse 81   Ht 5\' 3"  (1.6 m)   Wt 214 lb 9.6 oz (97.3 kg)   LMP 04/23/2009   SpO2 100%   BMI 38.01 kg/m   General: Alert, pleasant woman. NAD. HEENT: NCAT. MMM. CV: RRR, no murmurs.  Resp: CTAB, no wheezing or crackles. Normal WOB on RA.  Abm: Soft, nontender, nondistended. BS present. Ext: Moves all ext spontaneously Skin: Warm, well perfused   ASSESSMENT/PLAN:    Assessment & Plan Hypothyroidism, unspecified type TSH in Feb 4.59, with FT4 1.22.  Will recheck TSH today to assess thyroid status. Prediabetes A1c stable compared to prior, diet controlled. Hyperlipidemia, unspecified hyperlipidemia type Patient is now taking 5 mg of Crestor daily.  She was previously supposed to take 2.5 due to myalgias, but is tolerating 5 mg. - Cont Crestor 5mg  daily Coronary artery disease involving native heart, unspecified vessel or lesion type, unspecified whether angina present Has not been taking ASA or metoprolol. BP at goal. Has f/u with Cardiology in 1 month. Advised to have Cardiologist  reassess need for ASA and metop.     Lincoln Brigham, MD [facility]

## 2023-04-04 NOTE — Patient Instructions (Signed)
Good to see you today - Thank you for coming in  Things we discussed today:  1) Continue taking your Crestor 5mg  tablet. I'm glad you are tolerating it well.  2) We will check your thyroid levels today

## 2023-04-05 LAB — TSH RFX ON ABNORMAL TO FREE T4: TSH: 3.6 u[IU]/mL (ref 0.450–4.500)

## 2023-04-07 MED ORDER — ROSUVASTATIN CALCIUM 5 MG PO TABS: 5.0000 mg | ORAL_TABLET | Freq: Every day | ORAL | 3 refills | Status: DC

## 2023-04-07 NOTE — Assessment & Plan Note (Signed)
A1c stable compared to prior, diet controlled.

## 2023-04-07 NOTE — Assessment & Plan Note (Signed)
Patient is now taking 5 mg of Crestor daily.  She was previously supposed to take 2.5 due to myalgias, but is tolerating 5 mg. - Cont Crestor 5mg  daily

## 2023-04-29 NOTE — Progress Notes (Signed)
 Cardiology Office Note:   Date:  05/02/2023  ID:  Donna Norton, DOB [birth year], MRN [id number] PCP:  [name], MD  [facility] Providers Cardiologist:  [name], MD Referring MD: [name], MD  Chief Complaint/Reason for Referral: Cardiology follow-up ASSESSMENT:    1. Mild CAD   2. Hyperlipidemia LDL goal <70   3. Elevated lipoprotein(a)   4. BMI 36.0-36.9,adult   5. Prediabetes   6. Palpitations     PLAN:   In order of problems listed above: Coronary artery disease: Mild on coronary CTA: Continue Crestor  5 mg and start aspirin  81 mg.  No need to be on Imdur  or beta-blocker. Hyperlipidemia: Check lipid panel and LFTs today.  Given elevated LP(a) will target LDL goal less than 55. Elevated LP(a): See discussion above. Elevated BMI: Diet and exercise.  If she is diagnosed with diabetes consider GLP-1 receptor agonist therapy. Prediabetes: Per PCP. Palpitations: Will check reflex TSH and monitor. Orthopnea: The patient gets a peculiar chest heaviness when she lays flat.  We will trial Lasix  20 mg for 3 days to see if this improves.  If it does the patient will let us  know and we will continue.  If it does not the patient should consider perhaps a GI evaluation.            Dispo:  Return in about 6 months (around 10/30/2023).      Medication Adjustments/Labs and Tests Ordered: Current medicines are reviewed at length with the patient today.  Concerns regarding medicines are outlined above.  The following changes have been made:     Labs/tests ordered: Orders Placed This Encounter  Procedures   Lipoprotein A (LPA)   Lipid panel   Hepatic function panel   TSH Rfx on Abnormal to Free T4   LONG TERM MONITOR (3-14 DAYS)    Medication Changes: Meds ordered this encounter  Medications   furosemide  (LASIX ) 20 MG tablet    Sig: Take 1 tablet (20 mg total) by mouth daily.    Dispense:  15 tablet    Refill:  0    Current medicines are reviewed at length  with the patient today.  The patient does not have concerns regarding medicines.  I spent 33 minutes reviewing all clinical data during and prior to this visit including all relevant imaging studies, laboratories, clinical information from other health systems and prior notes from both Cardiology and other specialties, interviewing the patient, conducting a complete physical examination, and coordinating care in order to formulate a comprehensive and personalized evaluation and treatment plan.   History of Present Illness:      FOCUSED PROBLEM LIST:   Prediabetes Hyperlipidemia Intolerant of atorvastatin  LP(a) 312 Coronary artery disease Minimal coronary CTA 2024 Graves' disease BMI 36  2/24: The patient is a 58 y.o. female with the indicated medical history here for recommendations regarding chest pain and dyspnea.  The patient was seen by her PCP recently with these complaints.  An EKG, troponins and other laboratories were reassuring; her D-Dimer was elevated and a LE doppler was negative.   The patient is here today and reports chest discomfort, chest fullness on recumbency, and shortness of breath.  This happens with exertion and resolves with rest.  She denies any symptoms at rest.  She denies any bleeding or bruising.  She does snore and because of problems with sleep hygiene is right now reluctant to get a sleep apnea evaluation.  She did have some peripheral edema recently but  it seems to be better now that she has been off her feet for some time.  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  She fortunately has not required any emergency room visits or hospitalizations.   She does not smoke.  Plan:  Start Imdur  30, Toprol  12.5, and ASA; obtain coronary CTA and TTE; consider Jardiance in the future; refer to pharmacy for lipid and weight management.  Consider empiric Lasix  for shortness of breath depending on testing.   1/25: In the interim the patient had a coronary  CTA which showed minimal obstructive coronary artery disease and an echocardiogram with preserved LV function with no significant valvular abnormalities.  She was seen by pharmacy and she decided to retrial Crestor .  She was seen by her PCP and was not taking Imdur , metoprolol , or aspirin .  She was tolerating Crestor  5 mg however.  The patient is doing fairly well.  She occasionally gets short of breath when she goes upstairs.  She does not exercise on a regular basis and thinks that might be the issue.  She typically not short of breath most days.  She did denies any exertional angina.  Of note she is not noticed a racing heartbeat particularly when she lays down.  She will feel a forceful thumping and sometimes a fast heartbeat.  This last perhaps several minutes.  She also notices when she lays directly on her back she will feel a chest fullness.  She has to lay on her side to make this better.  She denies any peripheral edema, presyncope, or syncope.  She is tolerating low-dose Crestor  well.          Current Medications: Current Meds  Medication Sig   albuterol  (PROAIR  HFA) 108 (90 Base) MCG/ACT inhaler Inhale 2 puffs into the lungs every 6 (six) hours as needed for wheezing or shortness of breath.   furosemide  (LASIX ) 20 MG tablet Take 1 tablet (20 mg total) by mouth daily.   ibuprofen  (ADVIL ) 200 MG tablet Take 200 mg by mouth every 6 (six) hours as needed.   meloxicam  (MOBIC ) 15 MG tablet Take 15 mg by mouth daily as needed.   Multiple Vitamins-Minerals (MULTIVITAMIN WITH MINERALS) tablet Take 1 tablet by mouth daily.   rosuvastatin  (CRESTOR ) 5 MG tablet Take 1 tablet (5 mg total) by mouth daily.   valACYclovir (VALTREX) 500 MG tablet Take by mouth.     Review of Systems:   Please see the history of present illness.    All other systems reviewed and are negative.     EKGs/Labs/Other Test Reviewed:   EKG: EKG performed February 2024 demonstrates sinus rhythm  EKG  Interpretation Date/Time:    Ventricular Rate:    PR Interval:    QRS Duration:    QT Interval:    QTC Calculation:   R Axis:      Text Interpretation:           Risk Assessment/Calculations:          Physical Exam:   VS:  BP 122/76   Pulse 69   Ht 5' 3 (1.6 m)   Wt 212 lb 6.4 oz (96.3 kg)   LMP 04/23/2009   SpO2 97%   BMI 37.62 kg/m        Wt Readings from Last 3 Encounters:  05/02/23 212 lb 6.4 oz (96.3 kg)  04/04/23 214 lb 9.6 oz (97.3 kg)  07/16/22 206 lb 8 oz (93.7 kg)  GENERAL:  No apparent distress, AOx3 HEENT:  No carotid bruits, +2 carotid impulses, no scleral icterus CAR: RRR no murmurs, gallops, rubs, or thrills RES:  Clear to auscultation bilaterally ABD:  Soft, nontender, nondistended, positive bowel sounds x 4 VASC:  +2 radial pulses, +2 carotid pulses NEURO:  CN 2-12 grossly intact; motor and sensory grossly intact PSYCH:  No active depression or anxiety EXT:  No edema, ecchymosis, or cyanosis  Signed, Lurena MARLA Red, MD  05/02/2023 9:08 AM    [facility] [address] [place], [place], [place]  [address] Phone: [phone]; Fax: [phone]   Note:  This document was prepared using Dragon voice recognition software and may include unintentional dictation errors.

## 2023-05-02 ENCOUNTER — Ambulatory Visit (INDEPENDENT_AMBULATORY_CARE_PROVIDER_SITE_OTHER): Payer: No Typology Code available for payment source

## 2023-05-02 ENCOUNTER — Encounter: Payer: Self-pay | Admitting: Internal Medicine

## 2023-05-02 ENCOUNTER — Ambulatory Visit: Payer: No Typology Code available for payment source | Attending: Internal Medicine | Admitting: Internal Medicine

## 2023-05-02 VITALS — BP 122/76 | HR 69 | Ht 63.0 in | Wt 212.4 lb

## 2023-05-02 DIAGNOSIS — I2510 Atherosclerotic heart disease of native coronary artery without angina pectoris: Secondary | ICD-10-CM | POA: Diagnosis not present

## 2023-05-02 DIAGNOSIS — R002 Palpitations: Secondary | ICD-10-CM

## 2023-05-02 DIAGNOSIS — Z6836 Body mass index (BMI) 36.0-36.9, adult: Secondary | ICD-10-CM

## 2023-05-02 DIAGNOSIS — E785 Hyperlipidemia, unspecified: Secondary | ICD-10-CM

## 2023-05-02 DIAGNOSIS — E7841 Elevated Lipoprotein(a): Secondary | ICD-10-CM | POA: Diagnosis not present

## 2023-05-02 DIAGNOSIS — R7303 Prediabetes: Secondary | ICD-10-CM

## 2023-05-02 MED ORDER — FUROSEMIDE 20 MG PO TABS: 20.0000 mg | ORAL_TABLET | Freq: Every day | ORAL | 0 refills | Status: AC

## 2023-05-02 NOTE — Patient Instructions (Signed)
 Medication Instructions:  Your physician has recommended you make the following change in your medication:  1.) start furosemide  (Lasix ) 20 mg - one tablet in the morning daily for 3 days - call or write in to let Dr. Wendel know if this helps  *If you need a refill on your cardiac medications before your next appointment, please call your pharmacy*   Lab Work: Today: lipids, liver, Lpa, TSH w reflex T4 if abnormal If you have labs (blood work) drawn today and your tests are completely normal, you will receive your results only by: MyChart Message (if you have MyChart) OR A paper copy in the mail If you have any lab test that is abnormal or we need to change your treatment, we will call you to review the results.   Testing/Procedures: 3 day Zio Heart Monitor   Follow-Up: At [facility], you and your health needs are our priority.  As part of our continuing mission to provide you with exceptional heart care, we have created designated Provider Care Teams.  These Care Teams include your primary Cardiologist (physician) and Advanced Practice Providers (APPs -  Physician Assistants and Nurse Practitioners) who all work together to provide you with the care you need, when you need it.    Your next appointment:   6 month(s)  Provider:   Arun K Thukkani, MD     Other instructions: Donna Norton- Long Term Monitor Instructions  Your physician has requested you wear a ZIO patch monitor for 3 days.  This is a single patch monitor. [employer] supplies one patch monitor per enrollment. Additional stickers are not available. Please do not apply patch if you will be having a Nuclear Stress Test,  Echocardiogram, Cardiac CT, MRI, or Chest Xray during the period you would be wearing the  monitor. The patch cannot be worn during these tests. You cannot remove and re-apply the  ZIO XT patch monitor.  Your ZIO patch monitor will be mailed 3 day USPS to your address on file. It may take 3-5 days   to receive your monitor after you have been enrolled.  Once you have received your monitor, please review the enclosed instructions. Your monitor  has already been registered assigning a specific monitor serial # to you.  Billing and Patient Assistance Program Information  We have supplied [employer] with any of your insurance information on file for billing purposes. [employer] offers a sliding scale Patient Assistance Program for patients that do not have  insurance, or whose insurance does not completely cover the cost of the ZIO monitor.  You must apply for the Patient Assistance Program to qualify for this discounted rate.  To apply, please call [employer] at [phone], select option 4, select option 2, ask to apply for  Patient Assistance Program. Meredeth will ask your household income, and how many people  are in your household. They will quote your out-of-pocket cost based on that information.  [employer] will also be able to set up a 66-month, interest-free payment plan if needed.  Applying the monitor   Shave hair from upper left chest.  Hold abrader disc by orange tab. Rub abrader in 40 strokes over the upper left chest as  indicated in your monitor instructions.  Clean area with 4 enclosed alcohol pads. Let dry.  Apply patch as indicated in monitor instructions. Patch will be placed under collarbone on left  side of chest with arrow pointing upward.  Rub patch adhesive wings for 2 minutes. Remove white label  marked 1. Remove the white  label marked 2. Rub patch adhesive wings for 2 additional minutes.  While looking in a mirror, press and release button in center of patch. A small green light will  flash 3-4 times. This will be your only indicator that the monitor has been turned on.  Do not shower for the first 24 hours. You may shower after the first 24 hours.  Press the button if you feel a symptom. You will hear a small click. Record Date, Time and  Symptom in the Patient  Logbook.  When you are ready to remove the patch, follow instructions on the last 2 pages of Patient  Logbook. Stick patch monitor onto the last page of Patient Logbook.  Place Patient Logbook in the blue and white box. Use locking tab on box and tape box closed  securely. The blue and white box has prepaid postage on it. Please place it in the mailbox as  soon as possible. Your physician should have your test results approximately 7 days after the  monitor has been mailed back to [facility].  Call [facility] Customer Care at [phone] if you have questions regarding  your ZIO XT patch monitor. Call them immediately if you see an orange light blinking on your  monitor.  If your monitor falls off in less than 4 days, contact our Monitor department at [phone].  If your monitor becomes loose or falls off after 4 days call [employer] at [phone] for  suggestions on securing your monitor

## 2023-05-02 NOTE — Progress Notes (Unsigned)
 Enrolled for [employer] to mail a ZIO XT long term holter monitor to the patients address on file.

## 2023-05-03 LAB — LIPID PANEL
Chol/HDL Ratio: 3.8 {ratio} (ref 0.0–4.4)
Cholesterol, Total: 190 mg/dL (ref 100–199)
HDL: 50 mg/dL (ref >39)
LDL Chol Calc (NIH): 119 mg/dL — ABNORMAL HIGH (ref 0–99)
Triglycerides: 117 mg/dL (ref 0–149)
VLDL Cholesterol Cal: 21 mg/dL (ref 5–40)

## 2023-05-03 LAB — HEPATIC FUNCTION PANEL
ALT: 15 [IU]/L (ref 0–32)
AST: 17 [IU]/L (ref 0–40)
Albumin: 4.5 g/dL (ref 3.8–4.9)
Alkaline Phosphatase: 89 [IU]/L (ref 44–121)
Bilirubin Total: 0.8 mg/dL (ref 0.0–1.2)
Bilirubin, Direct: 0.24 mg/dL (ref 0.00–0.40)
Total Protein: 7.4 g/dL (ref 6.0–8.5)

## 2023-05-03 LAB — TSH RFX ON ABNORMAL TO FREE T4: TSH: 3.06 u[IU]/mL (ref 0.450–4.500)

## 2023-05-03 LAB — LIPOPROTEIN A (LPA): Lipoprotein (a): 322.8 nmol/L — ABNORMAL HIGH (ref <75.0)

## 2023-05-06 ENCOUNTER — Emergency Department
Admission: EM | Admit: 2023-05-06 | Discharge: 2023-05-06 | Disposition: A | Discharge status: Home / Self Care | Payer: No Typology Code available for payment source | Attending: Emergency Medicine | Admitting: Emergency Medicine

## 2023-05-06 ENCOUNTER — Other Ambulatory Visit: Payer: Self-pay

## 2023-05-06 ENCOUNTER — Emergency Department: Payer: No Typology Code available for payment source

## 2023-05-06 ENCOUNTER — Telehealth: Payer: No Typology Code available for payment source | Admitting: Family Medicine

## 2023-05-06 DIAGNOSIS — Z20822 Contact with and (suspected) exposure to covid-19: Secondary | ICD-10-CM | POA: Diagnosis not present

## 2023-05-06 DIAGNOSIS — R6883 Chills (without fever): Secondary | ICD-10-CM | POA: Diagnosis not present

## 2023-05-06 DIAGNOSIS — R5381 Other malaise: Secondary | ICD-10-CM | POA: Diagnosis not present

## 2023-05-06 DIAGNOSIS — R059 Cough, unspecified: Secondary | ICD-10-CM | POA: Diagnosis not present

## 2023-05-06 DIAGNOSIS — R5383 Other fatigue: Secondary | ICD-10-CM | POA: Insufficient documentation

## 2023-05-06 DIAGNOSIS — R509 Fever, unspecified: Secondary | ICD-10-CM

## 2023-05-06 DIAGNOSIS — M791 Myalgia, unspecified site: Secondary | ICD-10-CM | POA: Diagnosis present

## 2023-05-06 DIAGNOSIS — R6889 Other general symptoms and signs: Secondary | ICD-10-CM

## 2023-05-06 LAB — CBC WITH DIFFERENTIAL/PLATELET
Abs Immature Granulocytes: 0.06 10*3/uL (ref 0.00–0.07)
Basophils Absolute: 0.1 10*3/uL (ref 0.0–0.1)
Basophils Relative: 1 %
Eosinophils Absolute: 0.1 10*3/uL (ref 0.0–0.5)
Eosinophils Relative: 1 %
HCT: 45.7 % (ref 36.0–46.0)
Hemoglobin: 15.7 g/dL — ABNORMAL HIGH (ref 12.0–15.0)
Immature Granulocytes: 0 %
Lymphocytes Relative: 22 %
Lymphs Abs: 3.2 10*3/uL (ref 0.7–4.0)
MCH: 31.2 pg (ref 26.0–34.0)
MCHC: 34.4 g/dL (ref 30.0–36.0)
MCV: 90.9 fL (ref 80.0–100.0)
Monocytes Absolute: 0.6 10*3/uL (ref 0.1–1.0)
Monocytes Relative: 4 %
Neutro Abs: 10.4 10*3/uL — ABNORMAL HIGH (ref 1.7–7.7)
Neutrophils Relative %: 72 %
Platelets: 273 10*3/uL (ref 150–400)
RBC: 5.03 MIL/uL (ref 3.87–5.11)
RDW: 12.2 % (ref 11.5–15.5)
WBC: 14.4 10*3/uL — ABNORMAL HIGH (ref 4.0–10.5)
nRBC: 0 % (ref 0.0–0.2)

## 2023-05-06 LAB — RESP PANEL BY RT-PCR (RSV, FLU A&B, COVID)  RVPGX2
Influenza A by PCR: NEGATIVE
Influenza B by PCR: NEGATIVE
Resp Syncytial Virus by PCR: NEGATIVE
SARS Coronavirus 2 by RT PCR: NEGATIVE

## 2023-05-06 LAB — BASIC METABOLIC PANEL
Anion gap: 14 (ref 5–15)
BUN: 11 mg/dL (ref 6–20)
CO2: 27 mmol/L (ref 22–32)
Calcium: 9.7 mg/dL (ref 8.9–10.3)
Chloride: 100 mmol/L (ref 98–111)
Creatinine, Ser: 0.61 mg/dL (ref 0.44–1.00)
GFR, Estimated: >60 mL/min (ref >60)
Glucose, Bld: 92 mg/dL (ref 70–99)
Potassium: 3.9 mmol/L (ref 3.5–5.1)
Sodium: 141 mmol/L (ref 135–145)

## 2023-05-06 MED ORDER — ACETAMINOPHEN 325 MG PO TABS
650.0000 mg | ORAL_TABLET | Freq: Once | ORAL | Status: AC
  Administered 2023-05-06: 650 mg via ORAL
  Filled 2023-05-06: qty 2

## 2023-05-06 MED ORDER — CYCLOBENZAPRINE HCL 5 MG PO TABS: 5.0000 mg | ORAL_TABLET | Freq: Three times a day (TID) | ORAL | 0 refills | Status: DC | PRN

## 2023-05-06 MED ORDER — CLINDAMYCIN HCL 300 MG PO CAPS: 300.0000 mg | ORAL_CAPSULE | Freq: Three times a day (TID) | ORAL | 0 refills | Status: AC

## 2023-05-06 MED ORDER — HYDROCODONE-ACETAMINOPHEN 5-325 MG PO TABS: 1.0000 | ORAL_TABLET | Freq: Three times a day (TID) | ORAL | 0 refills | Status: AC | PRN

## 2023-05-06 NOTE — Patient Instructions (Signed)
  Verma M Kye, thank you for joining Chiquita CHRISTELLA Barefoot, NP for today's virtual visit.  While this provider is not your primary care provider (PCP), if your PCP is located in our provider database this encounter information will be shared with them immediately following your visit.   A [facility] MyChart account gives you access to today's visit and all your visits, tests, and labs performed at [facility]  click here if you don't have a [facility] MyChart account or go to [email or web address]  Consent: (Patient) Donna Norton provided verbal consent for this virtual visit at the beginning of the encounter.  Current Medications:  Current Outpatient Medications:    albuterol  (PROAIR  HFA) 108 (90 Base) MCG/ACT inhaler, Inhale 2 puffs into the lungs every 6 (six) hours as needed for wheezing or shortness of breath., Disp: 1 each, Rfl: 2   aspirin  EC 81 MG tablet, Take 1 tablet (81 mg total) by mouth daily. Swallow whole. (Patient not taking: Reported on 05/02/2023), Disp: 90 tablet, Rfl: 3   furosemide  (LASIX ) 20 MG tablet, Take 1 tablet (20 mg total) by mouth daily., Disp: 15 tablet, Rfl: 0   ibuprofen  (ADVIL ) 200 MG tablet, Take 200 mg by mouth every 6 (six) hours as needed., Disp: , Rfl:    isosorbide  mononitrate (IMDUR ) 30 MG 24 hr tablet, Take 1 tablet (30 mg total) by mouth at bedtime. (Patient not taking: Reported on 05/02/2023), Disp: 90 tablet, Rfl: 3   meloxicam  (MOBIC ) 15 MG tablet, Take 15 mg by mouth daily as needed., Disp: , Rfl:    metoprolol  succinate (TOPROL  XL) 25 MG 24 hr tablet, Take 0.5 tablets (12.5 mg total) by mouth at bedtime. (Patient not taking: Reported on 05/02/2023), Disp: 45 tablet, Rfl: 3   Multiple Vitamins-Minerals (MULTIVITAMIN WITH MINERALS) tablet, Take 1 tablet by mouth daily., Disp: , Rfl:    rosuvastatin  (CRESTOR ) 5 MG tablet, Take 1 tablet (5 mg total) by mouth daily., Disp: 90 tablet, Rfl: 3   valACYclovir (VALTREX) 500 MG tablet, Take by  mouth., Disp: , Rfl:    Medications ordered in this encounter:  No orders of the defined types were placed in this encounter.    *If you need refills on other medications prior to your next appointment, please contact your pharmacy*  Follow-Up: Call back or seek an in-person evaluation if the symptoms worsen or if the condition fails to improve as anticipated.  [facility] [phone]  Other Instructions  Please be seen in person ASAP- do not drive yourself.    If you have been instructed to have an in-person evaluation today at a local Urgent Care facility, please use the link below. It will take you to a list of all of our available [facility], including address, phone number and hours of operation. Please do not delay care.  [facility]  If you or a family member do not have a primary care provider, use the link below to schedule a visit and establish care. When you choose a [facility] primary care physician or advanced practice provider, you gain a long-term partner in health. Find a Primary Care Provider  Learn more about [facility]'s in-office and virtual care options: [facility] Get Care Now

## 2023-05-06 NOTE — ED Notes (Signed)
 First nurse note-pt brought in via ems from home.  Pt reports feeling bad for 2 days.  Bp 159/93, temp-98.1, oxygen sat 99%, heart rate-93 cbg-124 per ems.  Pt alert, sitting in wheelchair in lobby.

## 2023-05-06 NOTE — ED Triage Notes (Signed)
 Blood pressure elevated, chills, cough, BP elevated.  Finished antibiotics for a dental procedure, root canal on 04/23/2023.  States started to feel off yesterday, but today not feeling well.  AAOx3.  Skin warm and dry. NAD

## 2023-05-06 NOTE — ED Provider Notes (Signed)
 [facility] Emergency Department Provider Note     Event Date/Time   First MD Initiated Contact with Patient 05/06/23 1747     (approximate)   History   Generalized Body Aches   HPI  Donna Norton is a 58 y.o. female with a history of hypertension, prediabetes, GERD, anxiety, fibromyalgia, presents to the ED for evaluation of elevated blood pressure, cough, chills, malaise.  Patient would endorse she recently finished an antibiotic for a dental procedure secondary to a root canal with underlying infection.  She began to feel ill on yesterday, but overall she is also not feeling well today.  She denies any GI symptoms at this time.  Patient's primary complaint is fatigue and malaise.  She denies any recent fevers, chills, or sweats.  She denies difficulty breathing, swallowing, controlling oral secretions.  She also denies any frank swelling to the mouth or jaw.  Physical Exam   Triage Vital Signs: ED Triage Vitals  Encounter Vitals Group     BP 05/06/23 1727 (!) 168/96     Systolic BP Percentile --      Diastolic BP Percentile --      Pulse Rate 05/06/23 1727 90     Resp 05/06/23 1727 16     Temp 05/06/23 1728 99.7 F (37.6 C)     Temp Source 05/06/23 1727 Oral     SpO2 05/06/23 1809 98 %     Weight 05/06/23 1727 212 lb 4.9 oz (96.3 kg)     Height --      Head Circumference --      Peak Flow --      Pain Score 05/06/23 1744 0     Pain Loc --      Pain Education --      Exclude from Growth Chart --     Most recent vital signs: Vitals:   05/06/23 1809 05/06/23 1937  BP:  (!) 145/91  Pulse:  96  Resp:  17  Temp:  100 F (37.8 C)  SpO2: 98% 97%    General Awake, no distress. NAD HEENT NCAT. PERRL. EOMI. No rhinorrhea. Mucous membranes are moist.  CV:  Good peripheral perfusion. RRR RESP:  Normal effort. CTA ABD:  No distention.    ED Results / Procedures / Treatments   Labs (all labs ordered are listed, but only abnormal results  are displayed) Labs Reviewed  CBC WITH DIFFERENTIAL/PLATELET - Abnormal; Notable for the following components:      Result Value   WBC 14.4 (*)    Hemoglobin 15.7 (*)    Neutro Abs 10.4 (*)    All other components within normal limits  RESP PANEL BY RT-PCR (RSV, FLU A&B, COVID)  RVPGX2  BASIC METABOLIC PANEL     EKG   RADIOLOGY  I personally viewed and evaluated these images as part of my medical decision making, as well as reviewing the written report by the radiologist.  ED Provider Interpretation: No acute intrathoracic process based on interpretation  CXR  IMPRESSION: No active cardiopulmonary disease. PROCEDURES:  Critical Care performed: No  Procedures   MEDICATIONS ORDERED IN ED: Medications  acetaminophen  (TYLENOL ) tablet 650 mg (650 mg Oral Given 05/06/23 1950)     IMPRESSION / MDM / ASSESSMENT AND PLAN / ED COURSE  I reviewed the triage vital signs and the nursing notes.  Differential diagnosis includes, but is not limited to, COVID, flu, RSV, viral URI, gastroenteritis, pneumonia, bronchitis, electrolyte abnormality, dental infection, headache syndrome  Patient's presentation is most consistent with acute complicated illness / injury requiring diagnostic workup.  Patient's diagnosis is consistent with flulike symptoms with concern for underlying infectious etiology.  Patient presented in no acute distress, with complaints of fatigue and malaise.  Viral panel test was negative.  Chest x-ray without any obvious intrathoracic process, based on my interpretation.  She did have an elevated white count at 14 with a left shift.  Patient the time of evaluation did not want to remain in the ED for further evaluation and workup, despite presenting with concern for evolving, septic presentation.  Patient advised that she likely discharge in the ED and will follow her pending results on [facility] MyChart.  Given the concern for possible odontogenic  infection, I decided to discharge the patient with a prescription for clindamycin .    Patient will be discharged home with prescriptions for cyclobenzaprine  and hydrocodone , for her musculoskeletal pain.. Patient is to follow up with her PCP as discussed, as needed or otherwise directed. Patient is given ED precautions to return to the ED for any worsening or new symptoms.   FINAL CLINICAL IMPRESSION(S) / ED DIAGNOSES   Final diagnoses:  Flu-like symptoms     Rx / DC Orders   ED Discharge Orders          Ordered    clindamycin  (CLEOCIN ) 300 MG capsule  3 times daily        05/06/23 2024    HYDROcodone -acetaminophen  (NORCO) 5-325 MG tablet  3 times daily PRN        05/06/23 2024    cyclobenzaprine  (FLEXERIL ) 5 MG tablet  3 times daily PRN        05/06/23 2024             Note:  This document was prepared using Dragon voice recognition software and may include unintentional dictation errors.    Loyd Candida LULLA Aldona, PA-C 05/11/23 2129    Dorothyann Drivers, MD 05/12/23 [phone]

## 2023-05-06 NOTE — Discharge Instructions (Addendum)
 Your exam and available labs are consistent with infection/inflammation. You should take the prescription meds as directed. Follow-up with your primary provider or return for further evaluation.

## 2023-05-06 NOTE — ED Provider Triage Note (Signed)
 Emergency Medicine Provider Triage Evaluation Note  Donna Norton , a 58 y.o. female  was evaluated in triage.  Pt complains of infection in right upper molar are after extraction. Patient states dry cough, back pain, poor appetite. Denies fever. Finished antibiotic prescribed by dentist.  Review of Systems  Positive:  Negative:   Physical Exam  BP (!) 168/96 (BP Location: Left Arm)   Pulse 90   Temp 99.7 F (37.6 C) (Oral)   Resp 16   Wt 96.3 kg   LMP 04/23/2009   BMI 37.61 kg/m  Gen:   Awake, no distress   Resp:  Normal effort  MSK:   Moves extremities without difficulty  Other:  Mouth: Tender to palpation in right upper maxillary,   Medical Decision Making  Medically screening exam initiated at 5:32 PM.  Appropriate orders placed.  Donna Norton was informed that the remainder of the evaluation will be completed by another provider, this initial triage assessment does not replace that evaluation, and the importance of remaining in the ED until their evaluation is complete.  Patient with possible dental infection, ordered CBC   Donna Kast, PA-C 05/06/23 1735

## 2023-05-06 NOTE — Progress Notes (Signed)
 Virtual Visit Consent   Donna Norton, you are scheduled for a virtual visit with a [facility] provider today. Just as with appointments in the office, your consent must be obtained to participate. Your consent will be active for this visit and any virtual visit you may have with one of our providers in the next 365 days. If you have a MyChart account, a copy of this consent can be sent to you electronically.  As this is a virtual visit, video technology does not allow for your provider to perform a traditional examination. This may limit your provider's ability to fully assess your condition. If your provider identifies any concerns that need to be evaluated in person or the need to arrange testing (such as labs, EKG, etc.), we will make arrangements to do so. Although advances in technology are sophisticated, we cannot ensure that it will always work on either your end or our end. If the connection with a video visit is poor, the visit may have to be switched to a telephone visit. With either a video or telephone visit, we are not always able to ensure that we have a secure connection.  By engaging in this virtual visit, you consent to the provision of healthcare and authorize for your insurance to be billed (if applicable) for the services provided during this visit. Depending on your insurance coverage, you may receive a charge related to this service.  I need to obtain your verbal consent now. Are you willing to proceed with your visit today? Donna Norton has provided verbal consent on 05/06/2023 for a virtual visit (video or telephone). Donna CHRISTELLA Barefoot, NP  Date: 05/06/2023 2:52 PM  Virtual Visit via Video Note   I, Donna Norton, connected with  Donna Norton  ([id number], 1965/09/04) on 05/06/23 at  2:45 PM EST by a video-enabled telemedicine application and verified that I am speaking with the correct person using two identifiers.  Location: Patient: Virtual Visit Location Patient:  Home Provider: Virtual Visit Location Provider: Home Office   I discussed the limitations of evaluation and management by telemedicine and the availability of in person appointments. The patient expressed understanding and agreed to proceed.    History of Present Illness: Donna Norton is a 58 y.o. who identifies as a female who was assigned female at birth, and is being seen today for several concerns and symptoms  Onset was last night feeling bad, felt she was not voiding has much as she was drinking.  A lot of pain, swelling in hands, and feet felt funny, tossed and turned all night, woke with a headache, some pressure in head, and a mild cough, reduced appetite, and feeling weak and fatigued. Feels like she has a fever- but doesn't have a thermometer to check it  Feels like she will just fall asleep.   I have not felt this bad since I had COVID Of note recent dental infection completed her ABX with this thought.    Problems:  Patient Active Problem List   Diagnosis Date Noted   Hyperlipidemia 07/24/2022   Throat discomfort 07/16/2022   Viral illness 06/12/2022   Prediabetes 06/11/2022   Statin myopathy 06/07/2022   Urinary frequency 06/07/2022   Postmenopausal bleeding 09/06/2020   S/P hysterectomy with oophorectomy 09/06/2020   Essential hypertension, benign 05/31/2013   Cough 05/13/2013    Allergies:  Allergies  Allergen Reactions   Atorvastatin     Other reaction(s): shoulder pain   Ceclor [Cefaclor] Hives  Crestor  [Rosuvastatin ] Other (See Comments)    myalgias   Duloxetine      Other reaction(s): pulling her down and decreased appetitie, MADE SUICIDAL   Hydrocodone  Bit-Homatrop Mbr     Other reaction(s): headaches/ n/v   Methimazole Other (See Comments)    Liver ENZYMES RASIED    Percocet [Oxycodone -Acetaminophen ] Hives    CAN TAKE OXYCOONE CANNOT TAKE PERCOCET   Trazodone Hcl     Other reaction(s): dizziness   Medications:  Current Outpatient  Medications:    albuterol  (PROAIR  HFA) 108 (90 Base) MCG/ACT inhaler, Inhale 2 puffs into the lungs every 6 (six) hours as needed for wheezing or shortness of breath., Disp: 1 each, Rfl: 2   aspirin  EC 81 MG tablet, Take 1 tablet (81 mg total) by mouth daily. Swallow whole. (Patient not taking: Reported on 05/02/2023), Disp: 90 tablet, Rfl: 3   furosemide  (LASIX ) 20 MG tablet, Take 1 tablet (20 mg total) by mouth daily., Disp: 15 tablet, Rfl: 0   ibuprofen  (ADVIL ) 200 MG tablet, Take 200 mg by mouth every 6 (six) hours as needed., Disp: , Rfl:    isosorbide  mononitrate (IMDUR ) 30 MG 24 hr tablet, Take 1 tablet (30 mg total) by mouth at bedtime. (Patient not taking: Reported on 05/02/2023), Disp: 90 tablet, Rfl: 3   meloxicam  (MOBIC ) 15 MG tablet, Take 15 mg by mouth daily as needed., Disp: , Rfl:    metoprolol  succinate (TOPROL  XL) 25 MG 24 hr tablet, Take 0.5 tablets (12.5 mg total) by mouth at bedtime. (Patient not taking: Reported on 05/02/2023), Disp: 45 tablet, Rfl: 3   Multiple Vitamins-Minerals (MULTIVITAMIN WITH MINERALS) tablet, Take 1 tablet by mouth daily., Disp: , Rfl:    rosuvastatin  (CRESTOR ) 5 MG tablet, Take 1 tablet (5 mg total) by mouth daily., Disp: 90 tablet, Rfl: 3   valACYclovir (VALTREX) 500 MG tablet, Take by mouth., Disp: , Rfl:   Observations/Objective: Patient is well-developed, well-nourished in no acute distress.  Resting comfortably  at home.  Head is normocephalic, atraumatic.  No labored breathing.  Speech is clear and coherent with logical content.  Patient is alert and oriented at baseline.    Assessment and Plan:  1. Other fatigue (Primary)   2. Feels feverish   -ill appearing given the array of symptoms, history of recent dental infection, and excessive fatigue- in person would be better suited for evaluation.   Advised her not to drive and to either call EMS or a family member to help.   Patient acknowledged agreement and understanding of the plan.     Follow Up Instructions: I discussed the assessment and treatment plan with the patient. The patient was provided an opportunity to ask questions and all were answered. The patient agreed with the plan and demonstrated an understanding of the instructions.  A copy of instructions were sent to the patient via MyChart unless otherwise noted below.    The patient was advised to call back or seek an in-person evaluation if the symptoms worsen or if the condition fails to improve as anticipated.    Donna CHRISTELLA Barefoot, NP

## 2023-05-10 ENCOUNTER — Other Ambulatory Visit: Payer: Self-pay | Admitting: Family Medicine

## 2023-05-10 ENCOUNTER — Telehealth: Payer: Self-pay | Admitting: *Deleted

## 2023-05-10 DIAGNOSIS — E785 Hyperlipidemia, unspecified: Secondary | ICD-10-CM

## 2023-05-10 DIAGNOSIS — Z0000 Encounter for general adult medical examination without abnormal findings: Secondary | ICD-10-CM

## 2023-05-10 MED ORDER — ROSUVASTATIN CALCIUM 10 MG PO TABS: 10.0000 mg | ORAL_TABLET | Freq: Every day | ORAL | 3 refills | Status: DC

## 2023-05-10 NOTE — Telephone Encounter (Signed)
-----   Message from Orbie Pyo sent at 05/03/2023  7:25 AM EST ----- Increase crestor to 10mg , lipids/lfts in 2 months

## 2023-05-10 NOTE — Telephone Encounter (Signed)
Called and spoke w the patient.  After her visit she at the ER at [facility] for an infection.  She is just getting back on her feet.  She feels that underlying infection could have contributing to her palpitations.  She had oral surgery at the end of December and they think an infection could have been lingering since then.      She will apply her 3 day monitor next week. Wants to start slow with the Crestor increase. Will take one and half tablets for several days before going to 10 mg to make sure she tolerates.  Has intolerance to atorvastatin.  Aware to let us know when she needs her prescription updated as she has a good supply of 5 mg tablets at home.

## 2023-05-13 ENCOUNTER — Encounter: Payer: Self-pay | Admitting: Family Medicine

## 2023-05-13 ENCOUNTER — Ambulatory Visit (INDEPENDENT_AMBULATORY_CARE_PROVIDER_SITE_OTHER): Payer: No Typology Code available for payment source | Admitting: Family Medicine

## 2023-05-13 VITALS — BP 110/84 | HR 78 | Ht 63.0 in | Wt 213.0 lb

## 2023-05-13 DIAGNOSIS — K047 Periapical abscess without sinus: Secondary | ICD-10-CM

## 2023-05-13 NOTE — Progress Notes (Signed)
    SUBJECTIVE:   CHIEF COMPLAINT / HPI:   Visited ED 1/13 due to flulike symptoms.  However in the setting of recent dental procedure there was concern for odontogenic infection so she was discharged with clindamycin  Has been feeling better since ED visit. Still on clindamycin  Still having some residual back pain although has chronic fibromyalgia. Received flexeril in the ED  Would like to have CBC rechecked as she had some leukocytosis in the ED  PERTINENT  PMH / PSH: CAD, HLD  OBJECTIVE:   BP 110/84   Pulse 78   Ht 5\' 3"  (1.6 m)   Wt 213 lb (96.6 kg)   LMP 04/23/2009   SpO2 100%   BMI 37.73 kg/m    General: NAD, pleasant, able to participate in exam HEENT: R upper premolar tooth s/p extraction, no obvious swelling, redness, or lesions noted in mouth Cardiac: RRR, no murmurs auscultated Respiratory: CTAB, normal WOB Abdomen: soft, non-tender, non-distended, normoactive bowel sounds Extremities: warm and well perfused, no edema or cyanosis Skin: warm and dry, no rashes noted Neuro: alert, no obvious focal deficits, speech normal Psych: Normal affect and mood  ASSESSMENT/PLAN:   Assessment & Plan Dental infection Stable, no evidence of acute or worsening infection today. Advised to complete course of clindamycin as prescribed and follow-up as needed for this.  Recheck CBC given leukocytosis noted in the ED and patient preference   Vonna Drafts, MD [facility]

## 2023-05-13 NOTE — Patient Instructions (Signed)
Please finish up your course of clindamycin and take flexeril as needed for back spasms  Please let me know if you become interested in a sleep study  I will let you know if any results from today are abnormal

## 2023-05-14 ENCOUNTER — Other Ambulatory Visit: Payer: Self-pay | Admitting: Internal Medicine

## 2023-05-14 DIAGNOSIS — R002 Palpitations: Secondary | ICD-10-CM

## 2023-05-14 LAB — CBC WITH DIFFERENTIAL/PLATELET
Basophils Absolute: 0.1 10*3/uL (ref 0.0–0.2)
Basos: 1 %
EOS (ABSOLUTE): 0.3 10*3/uL (ref 0.0–0.4)
Eos: 2 %
Hematocrit: 43.4 % (ref 34.0–46.6)
Hemoglobin: 14.9 g/dL (ref 11.1–15.9)
Immature Grans (Abs): 0 10*3/uL (ref 0.0–0.1)
Immature Granulocytes: 0 %
Lymphocytes Absolute: 4.9 10*3/uL — ABNORMAL HIGH (ref 0.7–3.1)
Lymphs: 39 %
MCH: 31 pg (ref 26.6–33.0)
MCHC: 34.3 g/dL (ref 31.5–35.7)
MCV: 90 fL (ref 79–97)
Monocytes Absolute: 0.6 10*3/uL (ref 0.1–0.9)
Monocytes: 5 %
Neutrophils Absolute: 6.7 10*3/uL (ref 1.4–7.0)
Neutrophils: 53 %
Platelets: 265 10*3/uL (ref 150–450)
RBC: 4.81 x10E6/uL (ref 3.77–5.28)
RDW: 12.5 % (ref 11.7–15.4)
WBC: 12.6 10*3/uL — ABNORMAL HIGH (ref 3.4–10.8)

## 2023-05-16 NOTE — Telephone Encounter (Signed)
This pt of Dr. Lynnette Caffey was seen on 05/02/23. She's on a trial Furosemide requesting a refill. Does Dr Lynnette Caffey want to refill? Please advise

## 2023-05-20 ENCOUNTER — Telehealth: Payer: Self-pay

## 2023-05-20 NOTE — Telephone Encounter (Signed)
Patient Donna Norton on nurse line regarding concerns with continued elevated BP.   Returned call to patient. She was seen in the office on 05/13/23 for dental infection. She reports that she has now finished abx.   Last couple days BP has been 140's/ 90's. She reports that she will also have headache/ head pressure when BP is elevated.   161/92 around 1630. She reports a mild headache.   Patient measured BP while on the phone. BP: 150/91. She states that she does not take any BP medications.   Scheduled patient for follow up tomorrow afternoon at 1:50 pm.   ED precautions discussed.   Veronda Prude, RN

## 2023-05-21 ENCOUNTER — Ambulatory Visit (INDEPENDENT_AMBULATORY_CARE_PROVIDER_SITE_OTHER): Payer: No Typology Code available for payment source | Admitting: Student

## 2023-05-21 VITALS — BP 138/82 | HR 84 | Temp 97.9°F | Ht 63.0 in | Wt 215.0 lb

## 2023-05-21 DIAGNOSIS — I2510 Atherosclerotic heart disease of native coronary artery without angina pectoris: Secondary | ICD-10-CM | POA: Diagnosis not present

## 2023-05-21 DIAGNOSIS — I10 Essential (primary) hypertension: Secondary | ICD-10-CM

## 2023-05-21 NOTE — Progress Notes (Signed)
    SUBJECTIVE:   CHIEF COMPLAINT / HPI:   HTN Took HCTZ several years ago but BP has been controlled recently. Takes BP daily at home which has been running 140s-160s/90s.  When it is in the 160 systolic she has a headache and at one point had some blurry vision.  Today feels like her neck is a little stiff but not painful and denies headache, chest pain or vision changes. No nausea or vomiting.   Say BP has been elevated since she was recently ill with a dental infection. Just completed course of clindamycin 5 days ago after having a tooth pulled.    PERTINENT  PMH / PSH: HTN, prediabetes  OBJECTIVE:   BP 138/82   Pulse 84   Temp 97.9 F (36.6 C)   Ht 5\' 3"  (1.6 m)   Wt 215 lb (97.5 kg)   LMP 04/23/2009   SpO2 98%   BMI 38.09 kg/m    General: NAD, pleasant, able to participate in exam Cardiac: RRR, no murmurs. Respiratory: CTAB, normal effort, No wheezes, rales or rhonchi Skin: warm and dry, no rashes noted Neuro: alert, no obvious focal deficits Psych: Normal affect and mood  ASSESSMENT/PLAN:   Essential hypertension, benign Not currently on medications.  BP well-controlled in office today and has been well-controlled at previous visits on chart review. She is feeling well with normal neuroexam.  Discussed possibility that BP was elevated in setting of tooth infection which was very disruptive to her life. Given elevated home readings associated with blurred vision and headache, shared decision making used and decided on following up with Dr. Raymondo Band for ambulatory blood pressure monitoring.  Coronary artery disease involving native heart without angina pectoris Seen by cardiology on 05/02/23 with plan to trial lasix for orthopnea. Their note says she should take ASA 81 mg and Crestor for CAD but does not need to take Imdur or a Bblocker.  Pt denies orthopnea today and has not been taking Lasix. Metoprolol and Imdur removed from medication list.  Patient adviesed to continue  to follow-up with cardiology as planned     Dr. Erick Alley, DO [facility]

## 2023-05-21 NOTE — Assessment & Plan Note (Signed)
Seen by cardiology on 05/02/23 with plan to trial lasix for orthopnea. Their note says she should take ASA 81 mg and Crestor for CAD but does not need to take Imdur or a Bblocker.  Pt denies orthopnea today and has not been taking Lasix. Metoprolol and Imdur removed from medication list.  Patient adviesed to continue to follow-up with cardiology as planned

## 2023-05-21 NOTE — Patient Instructions (Signed)
It was great to see you! Thank you for allowing me to participate in your care!   Our plans for today:  - schedule apt with Dr. Raymondo Band for ambulatory blood pressure monitoring  - If you develop any concerning symptoms before then including blurry vision, headache with nausea or vomiting especially if blood pressure is >160/100, please return or go to the ED.   Take care and seek immediate care sooner if you develop any concerns.   Dr. Erick Alley, DO [facility]

## 2023-05-21 NOTE — Assessment & Plan Note (Signed)
Not currently on medications.  BP well-controlled in office today and has been well-controlled at previous visits on chart review. She is feeling well with normal neuroexam.  Discussed possibility that BP was elevated in setting of tooth infection which was very disruptive to her life. Given elevated home readings associated with blurred vision and headache, shared decision making used and decided on following up with Dr. Raymondo Band for ambulatory blood pressure monitoring.

## 2023-05-22 ENCOUNTER — Telehealth: Payer: Self-pay

## 2023-05-22 NOTE — Telephone Encounter (Signed)
Patient calls nurse line regarding yesterday's visit. She reports that since this visit she has had worsening in how she is feeling.   Reports headache, pain on right side of her face where tooth was removed, fatigue and overall not feeling well. Also reports elevated BP reading this morning of 140/92.  She is concerned about having continued infection. She denies fever, chills, chest pain, SHOB or visual changes.   She would like to schedule follow up visit and get repeat labs.   Scheduled for tomorrow afternoon.   ED precautions discussed.   Veronda Prude, RN

## 2023-05-23 ENCOUNTER — Encounter: Payer: Self-pay | Admitting: Internal Medicine

## 2023-05-23 ENCOUNTER — Ambulatory Visit (INDEPENDENT_AMBULATORY_CARE_PROVIDER_SITE_OTHER): Payer: No Typology Code available for payment source | Admitting: Family Medicine

## 2023-05-23 VITALS — BP 130/91 | HR 83 | Ht 63.0 in | Wt 215.2 lb

## 2023-05-23 DIAGNOSIS — I10 Essential (primary) hypertension: Secondary | ICD-10-CM | POA: Diagnosis not present

## 2023-05-23 DIAGNOSIS — R5383 Other fatigue: Secondary | ICD-10-CM

## 2023-05-23 NOTE — Assessment & Plan Note (Addendum)
Likely multifactorial.  Differential includes viral infection, chronic fibromyalgia, hypertension, thyroid dysfunction (TSH recently normal), residual tooth infection (though lack of fever and significant facial swelling and s/p antibiotics), intracranial process/stroke (though without focal neurologic signs on exam other than mildly reduced strength in left arm the patient reports her right arm has been more weak), pneumonia (though vitals and exam reassuring), endocarditis in the setting of recent tooth infection (though vitals and cardiac exam normal), and CAD/CHF (though chest pain/shortness of breath is intermittent and mostly associated with other above sick symptoms only, recent CAC 0%ile with minimal-mild CAD on CT angio coronaries, and patient is without signs of volume overload on exam). After shared decision making with patient, will recollect CBC to ensure white blood cell count downtrending.  Will also repeat BMP to ensure renal function/electrolytes not contributing to elevated BP.  Recommended keeping appointment with Dr. Raymondo Band for ambulatory blood pressure monitoring, as this is likely contributing to her headaches.  Follow-up results of blood testing and consider further evaluation by her dentist as well as repeat chest x-ray, echocardiogram, EKG if persistent. Can also consider additional fatigue workup with vitamin D, vitamin B12, and CK.

## 2023-05-23 NOTE — Progress Notes (Cosign Needed Addendum)
    SUBJECTIVE:   CHIEF COMPLAINT / HPI:   Sick symptoms She has had sick symptoms, including pain in her jaw, headaches, pain in the neck, shortness of breath, right arm weakness, occasional chest pain, and upper back pain for over a month at this point.  Has also had occasional cough with phlegm and congestion.  It all started before she knew she had a tooth infection.  She has since been treated with antibiotics for this and the tooth removed.  She will at times get better, though she feels worse again soon after.  No fevers.  No sick contacts.  PERTINENT  PMH / PSH: Hypertension, CAD, prediabetes, HLD  OBJECTIVE:   BP (!) 130/91 (Cuff Size: Large)   Pulse 83   Ht 5\' 3"  (1.6 m)   Wt 215 lb 3.2 oz (97.6 kg)   LMP 04/23/2009   SpO2 100%   BMI 38.12 kg/m   General: Alert and oriented, in NAD Skin: Warm, dry, and intact HEENT: NCAT, EOM grossly normal, midline nasal septum Cardiac: RRR, no m/r/g appreciated Respiratory: CTAB, breathing and speaking comfortably on RA Abdominal: Soft, nondistended, normoactive bowel sounds Extremities: Moves all extremities grossly equally Neurological: No gross focal deficit, cranial nerves II through XII intact, sensation intact throughout, left upper extremity 4/5 strength and 5 out of 5 throughout otherwise, gait normal Psychiatric: Anxious mood and affect, thought process normal  ASSESSMENT/PLAN:   Fatigue Likely multifactorial.  Differential includes viral infection, chronic fibromyalgia, hypertension, thyroid dysfunction (TSH recently normal), residual tooth infection (though lack of fever and significant facial swelling and s/p antibiotics), intracranial process/stroke (though without focal neurologic signs on exam other than mildly reduced strength in left arm the patient reports her right arm has been more weak), pneumonia (though vitals and exam reassuring), endocarditis in the setting of recent tooth infection (though vitals and cardiac  exam normal), and CAD/CHF (though chest pain/shortness of breath is intermittent and mostly associated with other above sick symptoms only, recent CAC 0%ile with minimal-mild CAD on CT angio coronaries, and patient is without signs of volume overload on exam). After shared decision making with patient, will recollect CBC to ensure white blood cell count downtrending.  Will also repeat BMP to ensure renal function/electrolytes not contributing to elevated BP.  Recommended keeping appointment with Dr. Raymondo Band for ambulatory blood pressure monitoring, as this is likely contributing to her headaches.  Follow-up results of blood testing and consider further evaluation by her dentist as well as repeat chest x-ray, echocardiogram, EKG if persistent. Can also consider additional fatigue workup with vitamin D, vitamin B12, and CK.   Janeal Holmes, MD [facility]

## 2023-05-23 NOTE — Patient Instructions (Signed)
I have made you an appointment for tomorrow morning to get a CBC and metabolic panel completed.  In the meantime, I recommend doing Tylenol and Motrin for any discomfort.  I am reassured against any problems with your brain, lungs, or heart, based on your physical exam today.  I highly recommend keeping the appointment with Dr. Hildred Laser to get ambulatory blood pressure monitoring so we can get you treated for this, as I feel this is likely contributing to your headaches.  I will keep you updated on your lab work.  Be sure to return to care ASAP if you have worsening symptoms.

## 2023-05-24 ENCOUNTER — Telehealth: Payer: Self-pay

## 2023-05-24 ENCOUNTER — Encounter: Payer: Self-pay | Admitting: Family Medicine

## 2023-05-24 ENCOUNTER — Other Ambulatory Visit (INDEPENDENT_AMBULATORY_CARE_PROVIDER_SITE_OTHER): Payer: No Typology Code available for payment source

## 2023-05-24 ENCOUNTER — Ambulatory Visit: Payer: No Typology Code available for payment source

## 2023-05-24 DIAGNOSIS — I10 Essential (primary) hypertension: Secondary | ICD-10-CM

## 2023-05-24 DIAGNOSIS — R7303 Prediabetes: Secondary | ICD-10-CM | POA: Diagnosis not present

## 2023-05-24 LAB — GLUCOSE, POCT (MANUAL RESULT ENTRY): POC Glucose: 88 mg/dL (ref 70–99)

## 2023-05-24 NOTE — Telephone Encounter (Signed)
Patient came in for LAB work today.   Scheduled her an appointment with Dr. Raymondo Band for 05/28/2023 @ 0830 for blood pressure monitoring.  Patient was complaining of not feeling well and the fact that she is prediabetic.   Robert performed a CBG which was 88.  Patient not currently having chest pains but just endorses a feeling of lethargy.  Advised patient to go to UC or ED on weekend if palpitations and chest pains return.  Glennie Hawk, CMA

## 2023-05-25 LAB — BASIC METABOLIC PANEL
BUN/Creatinine Ratio: 15 (ref 9–23)
BUN: 11 mg/dL (ref 6–24)
CO2: 23 mmol/L (ref 20–29)
Calcium: 9.4 mg/dL (ref 8.7–10.2)
Chloride: 103 mmol/L (ref 96–106)
Creatinine, Ser: 0.71 mg/dL (ref 0.57–1.00)
Glucose: 82 mg/dL (ref 70–99)
Potassium: 4.4 mmol/L (ref 3.5–5.2)
Sodium: 142 mmol/L (ref 134–144)
eGFR: 98 mL/min/{1.73_m2} (ref >59)

## 2023-05-25 LAB — CBC
Hematocrit: 43 % (ref 34.0–46.6)
Hemoglobin: 14.7 g/dL (ref 11.1–15.9)
MCH: 31.3 pg (ref 26.6–33.0)
MCHC: 34.2 g/dL (ref 31.5–35.7)
MCV: 92 fL (ref 79–97)
Platelets: 250 10*3/uL (ref 150–450)
RBC: 4.7 x10E6/uL (ref 3.77–5.28)
RDW: 12.3 % (ref 11.7–15.4)
WBC: 10.2 10*3/uL (ref 3.4–10.8)

## 2023-05-27 ENCOUNTER — Encounter: Payer: Self-pay | Admitting: Family Medicine

## 2023-06-14 ENCOUNTER — Ambulatory Visit: Payer: No Typology Code available for payment source

## 2023-06-25 ENCOUNTER — Encounter: Payer: Self-pay | Admitting: Pharmacist

## 2023-06-25 ENCOUNTER — Ambulatory Visit (INDEPENDENT_AMBULATORY_CARE_PROVIDER_SITE_OTHER): Payer: No Typology Code available for payment source | Admitting: Pharmacist

## 2023-06-25 VITALS — BP 133/90 | HR 91 | Wt 218.6 lb

## 2023-06-25 DIAGNOSIS — I10 Essential (primary) hypertension: Secondary | ICD-10-CM

## 2023-06-25 NOTE — Assessment & Plan Note (Signed)
 History of hypertension in the past not currently being treated with any HTN agents. Goal presssure of <130/80.     -Placed blood pressure cuff, provided education, patient instructed to wear cuff for 24 hours and return tomorrow to review results.

## 2023-06-25 NOTE — Progress Notes (Signed)
 S:     Chief Complaint  Patient presents with   Medication Management    Amb Blood Pressure - Day #1   58 y.o. female who presents for hypertension evaluation, education, and management. Patient arrives in fair- good spirits and presents without any assistance.   PMH is significant for recent illness, neck pain..  Patient reports having multiple stressors lately including her father having current admission to the hospital with stroke and transitioning to palliative care.  She verbalizes increased emotion and stress related to these events.   Patient was referred and last seen by Primary Care Provider, Dr. Phineas Real, on 05/23/2023.  At last visit, patient reported sickness and pain from her jaw, neck, upper back and chest.   Diagnosed with Hypertension in the year of unclear- reports from 2015 but no formal diagnosis.    Medication compliance is reported to be stopping all prescription medications in the last 2 weeks.  Discussed procedure for wearing the monitor and gave patient written instructions. Monitor was placed on non-dominant arm with instructions to return in the morning.   Current BP Medications include:  None  Antihypertensives tried in the past include: beta blockers and hydrochlorothiazide   O:  Review of Systems  HENT:  Positive for tinnitus (loud white noise intermittently).   Musculoskeletal:  Positive for neck pain.  Neurological:  Positive for headaches (burning sensation).  All other systems reviewed and are negative.   Physical Exam Constitutional:      Appearance: Normal appearance.  Pulmonary:     Effort: Pulmonary effort is normal.  Musculoskeletal:     Right lower leg: No edema.     Left lower leg: Edema (trace) present.  Neurological:     Mental Status: She is alert.  Psychiatric:        Behavior: Behavior normal.        Thought Content: Thought content normal.        Judgment: Judgment normal.    Last 3 Office BP readings: BP Readings from  Last 3 Encounters:  05/23/23 (!) 130/91  05/21/23 138/82  05/13/23 110/84    Clinical Atherosclerotic Cardiovascular Disease (ASCVD): Yes  The 10-year ASCVD risk score (Arnett DK, et al., 2019) is: 7.2%   Values used to calculate the score:     Age: 61 years     Sex: Female     Is Non-Hispanic African American: Yes     Diabetic: No     Tobacco smoker: No     Systolic Blood Pressure: 130 mmHg     Is BP treated: Yes     HDL Cholesterol: 50 mg/dL     Total Cholesterol: 190 mg/dL  Basic Metabolic Panel    Component Value Date/Time   NA 142 05/24/2023 1657   K 4.4 05/24/2023 1657   CL 103 05/24/2023 1657   CO2 23 05/24/2023 1657   GLUCOSE 82 05/24/2023 1657   GLUCOSE 92 05/06/2023 1731   BUN 11 05/24/2023 1657   CREATININE 0.71 05/24/2023 1657   CALCIUM 9.4 05/24/2023 1657   GFRNONAA >60 05/06/2023 1731   GFRAA >60 10/14/2019 1250    ABPM Study Data: Arm Placement left arm   For Office Goal BP of <130/80 mmHg:  ABPM thresholds: Overall BP <125/75 mmHg, daytime BP <130/80 mmHg, sleeptime BP <110/65 mmHg      A/P: History of hypertension in the past not currently being treated with any HTN agents. Goal presssure of <130/80.     -Placed blood pressure  cuff, provided education, patient instructed to wear cuff for 24 hours and return tomorrow to review results.   Written patient instructions provided including activity/symptom/event log. Patient verbalized understanding of plan. Total time in face to face counseling 34 minutes.    Follow-up: Tomorrow AM - early morning appointment 8:30 AM  Patient seen with Threasa Heads, PharmD Candidate and Mack Guise, PharmD Candidate.

## 2023-06-25 NOTE — Patient Instructions (Signed)

## 2023-06-25 NOTE — Progress Notes (Signed)
 Reviewed and agree with Dr Macky Lower plan.

## 2023-06-26 ENCOUNTER — Encounter: Payer: Self-pay | Admitting: Pharmacist

## 2023-06-26 ENCOUNTER — Ambulatory Visit (INDEPENDENT_AMBULATORY_CARE_PROVIDER_SITE_OTHER): Admitting: Pharmacist

## 2023-06-26 VITALS — BP 123/78 | HR 85

## 2023-06-26 DIAGNOSIS — I10 Essential (primary) hypertension: Secondary | ICD-10-CM | POA: Diagnosis not present

## 2023-06-26 NOTE — Patient Instructions (Signed)
It was nice to see you today!  Thank you for completing the blood pressure monitoring evaluation.  Your goal blood pressure is < 130/80 mmHg   Medication Changes:  Continue all other medication the same.   Monitor blood pressure at home and keep a log (on a piece of paper) to bring with you to your next visit.

## 2023-06-26 NOTE — Progress Notes (Signed)
 Reviewed and agree with Dr Macky Lower plan.

## 2023-06-26 NOTE — Assessment & Plan Note (Signed)
 History of hypertension in the past, currently not taking any medication for blood pressure lowering.  Goal presssure of <130/80.  Found to have normal blood pressure with 24-hour ambulatory blood pressure evaluation which demonstrates an average AWAKE blood pressure of 123/78 mmHg. Nocturnal dipping pattern is normal.   Changes to medications - None

## 2023-06-26 NOTE — Progress Notes (Signed)
 S:     Chief Complaint  Patient presents with   Medication Management    Amb BP Monitor Day#2   58 y.o. female who  presents for hypertension evaluation, education, and management. Patient arrives in fair- good spirits and presents without any assistance.    PMH is significant for recent illness, neck pain.  Patient was referred and last seen by Primary Care Provider, Dr. Phineas Real, on 05/23/2023.    Diagnosed with Hypertension in the year of unclear- reports from 2015 but no formal diagnosis.     Medication compliance is reported to be stopping all prescription medications in the last 2 weeks.  Patient returns to clinic with 24 hour blood pressure monitor and reports not sleeping well, feeling improved mood. Patient reports they were able to wear the Ambulatory Blood Pressure Cuff for the entire 24 evaluation period.   O:  Review of Systems  Musculoskeletal:  Positive for neck pain.  All other systems reviewed and are negative.   Physical Exam Vitals reviewed.  Constitutional:      Appearance: Normal appearance.  Pulmonary:     Effort: Pulmonary effort is normal.  Neurological:     Mental Status: She is alert.  Psychiatric:        Mood and Affect: Mood normal.        Behavior: Behavior normal.        Thought Content: Thought content normal.        Judgment: Judgment normal.     Last 3 Office BP readings: BP Readings from Last 3 Encounters:  06/26/23 123/78  06/25/23 (!) 133/90  05/23/23 (!) 130/91    Clinical Atherosclerotic Cardiovascular Disease (ASCVD): Yes  The 10-year ASCVD risk score (Arnett DK, et al., 2019) is: 6.1%   Values used to calculate the score:     Age: 74 years     Sex: Female     Is Non-Hispanic African American: Yes     Diabetic: No     Tobacco smoker: No     Systolic Blood Pressure: 123 mmHg     Is BP treated: Yes     HDL Cholesterol: 50 mg/dL     Total Cholesterol: 190 mg/dL  Basic Metabolic Panel    Component Value Date/Time   NA  142 05/24/2023 1657   K 4.4 05/24/2023 1657   CL 103 05/24/2023 1657   CO2 23 05/24/2023 1657   GLUCOSE 82 05/24/2023 1657   GLUCOSE 92 05/06/2023 1731   BUN 11 05/24/2023 1657   CREATININE 0.71 05/24/2023 1657   CALCIUM 9.4 05/24/2023 1657   GFRNONAA >60 05/06/2023 1731   GFRAA >60 10/14/2019 1250    Renal function: CrCl cannot be calculated (Patient's most recent lab result is older than the maximum 21 days allowed.).   ABPM Study Data: Arm Placement left arm  Overall Mean 24hr BP:   118/74 mmHg  HR: 83  Daytime Mean BP:  123/78 mmHg  HR: 85  Nighttime Mean BP:  102/62 mmHg  HR: 79  Dipping Pattern: Yes.    Sys:   17%   Dia: 20%   [normal dipping ~10-20%]    For Office Goal BP of <130/80 mmHg:  ABPM thresholds: Overall BP <125/75 mmHg, daytime BP <130/80 mmHg, sleeptime BP <110/65 mmHg    A/P: History of hypertension in the past, currently not taking any medication for blood pressure lowering.  Goal presssure of <130/80.  Found to have normal blood pressure with 24-hour ambulatory blood pressure evaluation  which demonstrates an average AWAKE blood pressure of 123/78 mmHg. Nocturnal dipping pattern is normal.   Changes to medications - None  Results reviewed and written information provided.    Written patient instructions provided. Patient verbalized understanding of treatment plan.  Total time in face to face counseling 15 minutes.    Follow-up:  Pharmacist None PCP clinic visit in PRN - TBD by patient  Patient seen with Threasa Heads, PharmD Candidate and Mack Guise, PharmD Candidate.

## 2023-08-01 ENCOUNTER — Other Ambulatory Visit: Payer: Self-pay | Admitting: Internal Medicine

## 2023-08-28 ENCOUNTER — Other Ambulatory Visit (HOSPITAL_BASED_OUTPATIENT_CLINIC_OR_DEPARTMENT_OTHER): Payer: Self-pay

## 2023-10-01 ENCOUNTER — Encounter: Payer: Self-pay | Admitting: Family Medicine

## 2023-10-01 ENCOUNTER — Ambulatory Visit: Admitting: Family Medicine

## 2023-10-01 VITALS — BP 123/78 | HR 83 | Ht 63.0 in | Wt 222.0 lb

## 2023-10-01 DIAGNOSIS — R7303 Prediabetes: Secondary | ICD-10-CM

## 2023-10-01 DIAGNOSIS — Z1211 Encounter for screening for malignant neoplasm of colon: Secondary | ICD-10-CM

## 2023-10-01 DIAGNOSIS — Z1231 Encounter for screening mammogram for malignant neoplasm of breast: Secondary | ICD-10-CM | POA: Diagnosis not present

## 2023-10-01 DIAGNOSIS — G629 Polyneuropathy, unspecified: Secondary | ICD-10-CM

## 2023-10-01 LAB — POCT GLYCOSYLATED HEMOGLOBIN (HGB A1C): HbA1c, POC (prediabetic range): 5.7 % (ref 5.7–6.4)

## 2023-10-01 MED ORDER — GABAPENTIN 100 MG PO CAPS: 100.0000 mg | ORAL_CAPSULE | Freq: Three times a day (TID) | ORAL | 3 refills | Status: AC

## 2023-10-01 NOTE — Assessment & Plan Note (Signed)
 A1c 5.7, discussed Discussed lifestyle interventions

## 2023-10-01 NOTE — Patient Instructions (Addendum)
 Try gabapentin 100mg  every night  If you respond well to this can go up to three times daily  If symptoms resolve you can take it as needed  Follow up in 1 month  I'll let you know if any results from today are abnormal. Otherwise will send a message on MyChart  You should get a call soon to set up your colonoscopy

## 2023-10-01 NOTE — Progress Notes (Signed)
    SUBJECTIVE:   CHIEF COMPLAINT / HPI:   Numbness/tingling and constant dull pain in both arms. Also having some sharp pains in her feet. Pain starts in fingers/toes and radiates upwards towards elbows/knees. Sometimes sharp, sometimes burning pain. X1.11mo  Has been experiencing these symptoms on and off for a few years in her hands However, over the past 1.52mo has also been experiencing symptoms in her feet  Currently taking multivitamins but not taking other meds currently  Due for mammogram and colonoscopy, willing to get these done  Prediabetes - wanting A1c checked, did go through a rough patch in life recently when she was stressed out and eating a lot but feels her lifestyle has improved a bit since then  PERTINENT  PMH / PSH: HTN, prediabetes, mild CAD  OBJECTIVE:   BP 123/78   Pulse 83   Ht 5\' 3"  (1.6 m)   Wt 222 lb (100.7 kg)   LMP 04/23/2009   SpO2 100%   BMI 39.33 kg/m    General: NAD, pleasant, able to participate in exam Cardiac: RRR, no murmurs auscultated Respiratory: CTAB, normal WOB Abdomen: soft, non-tender, non-distended, normoactive bowel sounds Extremities: warm and well perfused, no edema or cyanosis. 5/5 strength in b/l upper and lower extremities Skin: warm and dry, no rashes noted Neuro: alert, no obvious focal deficits, speech normal. Sensation intact b/l upper and lower extremities Psych: Normal affect and mood     ASSESSMENT/PLAN:   Assessment & Plan Polyneuropathy Unclear etiology, description of her pain does seem neuropathic in nature Not diabetic - A1c 5.7 Eval for anemia, electrolyte changes, vitamin deficiencies, liver disease - CBC, CMP, B12 Also get TSH, HIV SPEP to look for other causes of neuropathies/myelopathies   Rx gabapentin low dose 100mg  TID  If no improvement on this, consider increasing dose vs neuro referral for further eval, consider EMG Prediabetes A1c 5.7, discussed Discussed lifestyle  interventions Encounter for screening mammogram for malignant neoplasm of breast Ordered mammogram  Screening for colon cancer GI referral colonoscopy    Edison Gore, MD [facility]

## 2023-10-02 LAB — TSH RFX ON ABNORMAL TO FREE T4: TSH: 2.78 u[IU]/mL (ref 0.450–4.500)

## 2023-10-03 LAB — COMPREHENSIVE METABOLIC PANEL WITH GFR
ALT: 17 IU/L (ref 0–32)
AST: 18 IU/L (ref 0–40)
Albumin: 4.1 g/dL (ref 3.8–4.9)
Alkaline Phosphatase: 85 IU/L (ref 44–121)
BUN/Creatinine Ratio: 19 (ref 12–28)
BUN: 14 mg/dL (ref 8–27)
Bilirubin Total: 0.3 mg/dL (ref 0.0–1.2)
CO2: 18 mmol/L — ABNORMAL LOW (ref 20–29)
Calcium: 9.1 mg/dL (ref 8.7–10.3)
Chloride: 103 mmol/L (ref 96–106)
Creatinine, Ser: 0.73 mg/dL (ref 0.57–1.00)
Globulin, Total: 2.7 g/dL (ref 1.5–4.5)
Glucose: 96 mg/dL (ref 70–99)
Potassium: 3.8 mmol/L (ref 3.5–5.2)
Sodium: 139 mmol/L (ref 134–144)
Total Protein: 6.8 g/dL (ref 6.0–8.5)
eGFR: 94 mL/min/{1.73_m2} (ref >59)

## 2023-10-03 LAB — CBC WITH DIFFERENTIAL/PLATELET
Basophils Absolute: 0.1 10*3/uL (ref 0.0–0.2)
Basos: 1 %
EOS (ABSOLUTE): 0.3 10*3/uL (ref 0.0–0.4)
Eos: 2 %
Hematocrit: 43.4 % (ref 34.0–46.6)
Hemoglobin: 14.7 g/dL (ref 11.1–15.9)
Immature Grans (Abs): 0 10*3/uL (ref 0.0–0.1)
Immature Granulocytes: 0 %
Lymphocytes Absolute: 4.7 10*3/uL — ABNORMAL HIGH (ref 0.7–3.1)
Lymphs: 35 %
MCH: 31.4 pg (ref 26.6–33.0)
MCHC: 33.9 g/dL (ref 31.5–35.7)
MCV: 93 fL (ref 79–97)
Monocytes Absolute: 0.8 10*3/uL (ref 0.1–0.9)
Monocytes: 6 %
Neutrophils Absolute: 7.5 10*3/uL — ABNORMAL HIGH (ref 1.4–7.0)
Neutrophils: 56 %
Platelets: 250 10*3/uL (ref 150–450)
RBC: 4.68 x10E6/uL (ref 3.77–5.28)
RDW: 12.9 % (ref 11.7–15.4)
WBC: 13.3 10*3/uL — ABNORMAL HIGH (ref 3.4–10.8)

## 2023-10-03 LAB — PROTEIN ELECTROPHORESIS, SERUM
A/G Ratio: 1 (ref 0.7–1.7)
Albumin ELP: 3.4 g/dL (ref 2.9–4.4)
Alpha 1: 0.2 g/dL (ref 0.0–0.4)
Alpha 2: 0.8 g/dL (ref 0.4–1.0)
Beta: 1.3 g/dL (ref 0.7–1.3)
Gamma Globulin: 1.1 g/dL (ref 0.4–1.8)
Globulin, Total: 3.4 g/dL (ref 2.2–3.9)
M-Spike, %: 0.3 g/dL — ABNORMAL HIGH

## 2023-10-03 LAB — VITAMIN B12: Vitamin B-12: 565 pg/mL (ref 232–1245)

## 2023-10-03 LAB — HIV ANTIBODY (ROUTINE TESTING W REFLEX): HIV Screen 4th Generation wRfx: NONREACTIVE

## 2023-10-07 ENCOUNTER — Ambulatory Visit: Payer: Self-pay | Admitting: Family Medicine

## 2023-10-07 DIAGNOSIS — R778 Other specified abnormalities of plasma proteins: Secondary | ICD-10-CM

## 2023-10-09 ENCOUNTER — Other Ambulatory Visit: Payer: Self-pay

## 2023-10-09 DIAGNOSIS — R778 Other specified abnormalities of plasma proteins: Secondary | ICD-10-CM

## 2023-10-11 ENCOUNTER — Telehealth: Payer: Self-pay | Admitting: Family Medicine

## 2023-10-11 LAB — CBC WITH DIFFERENTIAL/PLATELET
Hemoglobin: 14.8 g/dL (ref 11.1–15.9)
Lymphocytes Absolute: 3.6 10*3/uL — ABNORMAL HIGH (ref 0.7–3.1)
MCH: 31.1 pg (ref 26.6–33.0)
MCHC: 33.3 g/dL (ref 31.5–35.7)
WBC: 8.2 10*3/uL (ref 3.4–10.8)

## 2023-10-11 LAB — PE AND FLC, SERUM: Ig Kappa Free Light Chain: 18 mg/L (ref 3.3–19.4)

## 2023-10-11 LAB — IMMUNOFIXATION, SERUM: IgG (Immunoglobin G), Serum: 1102 mg/dL (ref 586–1602)

## 2023-10-11 NOTE — Telephone Encounter (Signed)
 Patient calls back and says she will stop by the office on Monday to fill out this release form to get her records from [place].

## 2023-10-11 NOTE — Telephone Encounter (Signed)
 Spoke with patient informing her that we have not received her records from there. I suggested that she goes to [facility] and have them fax her records over. I also let her know that her forms can not be completed until we have her records. The forms will be placed back in [facility] folder until we receive records form [facility]. Courtny Bennison, CMA\

## 2023-10-14 ENCOUNTER — Other Ambulatory Visit: Payer: Self-pay | Admitting: Family Medicine

## 2023-10-14 ENCOUNTER — Ambulatory Visit: Payer: Self-pay | Admitting: Family Medicine

## 2023-10-14 DIAGNOSIS — G629 Polyneuropathy, unspecified: Secondary | ICD-10-CM

## 2023-10-14 DIAGNOSIS — M1990 Unspecified osteoarthritis, unspecified site: Secondary | ICD-10-CM

## 2023-10-14 LAB — CBC WITH DIFFERENTIAL/PLATELET
Basophils Absolute: 0.1 10*3/uL (ref 0.0–0.2)
Basos: 1 %
EOS (ABSOLUTE): 0.2 10*3/uL (ref 0.0–0.4)
Eos: 2 %
Hematocrit: 44.4 % (ref 34.0–46.6)
Immature Grans (Abs): 0 10*3/uL (ref 0.0–0.1)
Immature Granulocytes: 0 %
Lymphs: 43 %
MCV: 93 fL (ref 79–97)
Monocytes Absolute: 0.5 10*3/uL (ref 0.1–0.9)
Monocytes: 6 %
Neutrophils Absolute: 3.9 10*3/uL (ref 1.4–7.0)
Neutrophils: 48 %
Platelets: 239 10*3/uL (ref 150–450)
RBC: 4.76 x10E6/uL (ref 3.77–5.28)
RDW: 12.8 % (ref 11.7–15.4)

## 2023-10-14 LAB — ANA W/REFLEX: Anti Nuclear Antibody (ANA): NEGATIVE

## 2023-10-14 LAB — PE AND FLC, SERUM
A/G Ratio: 1 (ref 0.7–1.7)
Albumin ELP: 3.3 g/dL (ref 2.9–4.4)
Alpha 1: 0.2 g/dL (ref 0.0–0.4)
Alpha 2: 0.8 g/dL (ref 0.4–1.0)
Beta: 1.2 g/dL (ref 0.7–1.3)
Gamma Globulin: 1.1 g/dL (ref 0.4–1.8)
Globulin, Total: 3.3 g/dL (ref 2.2–3.9)
Ig Lambda Free Light Chain: 16.9 mg/L (ref 5.7–26.3)
KAPPA/LAMBDA RATIO: 1.07 (ref 0.26–1.65)
M-Spike, %: 0.2 g/dL — ABNORMAL HIGH
Total Protein: 6.6 g/dL (ref 6.0–8.5)

## 2023-10-14 LAB — IMMUNOFIXATION, SERUM
IgA/Immunoglobulin A, Serum: 314 mg/dL (ref 87–352)
IgM (Immunoglobulin M), Srm: 110 mg/dL (ref 26–217)

## 2023-10-14 MED ORDER — DICLOFENAC SODIUM 50 MG PO TBEC: 50.0000 mg | DELAYED_RELEASE_TABLET | Freq: Two times a day (BID) | ORAL | 1 refills | Status: DC

## 2023-10-14 NOTE — Progress Notes (Signed)
 Called patient to discuss results  Her labs redemonstrated slight M spike albeit slightly improved from the previous check at 0.2 from 0.3  Immunofixation revealed IgG monoclonal protein with lambda light chain specificity, per the report the monoclonal bands were faint and retesting in 4 to 6 months was suggested  (?MGUS)  I did speak with Dr. Tina who is on-call for hematology today to review these results.  He advised that routine outpatient follow-up with hematology is appropriate and that this is unlikely to be causing the patient's symptoms.  Patient reports she has not yet tried gabapentin  but does continue to have some persistent symptoms.  Reports that she often experiences stiffness in her knuckles/hands associated with her pain, curious whether this is arthritis.  Reports that she has previously taken diclofenac in the past which has helped her a lot with symptoms.  She is requesting to try this again.  I think is reasonable to trial a course of diclofenac.  I advised her to avoid other NSAIDs although she can try Voltaren gel.  Also advised to take this with meals and that this would likely not be a long-term medication.  Discussed return precautions and answered questions.  Refilled diclofenac and placed routine referral to hematology.  Consider neurology eval / EMG vs increasing gabapentin  if neuropathy persists.    Payton Coward, MD 6:23 PM 10/14/23

## 2023-10-21 NOTE — Telephone Encounter (Signed)
 Waiting on Medical Record to be faxed in from [place]. Nelson Land, CMA

## 2023-10-22 NOTE — Telephone Encounter (Signed)
 Patient calls nurse line in regards to medical records.   She reports she filled out a ROI last week and was told it would take 24 hours for her [facility] records to be released to us .   She dropped off a form that can not be completed until we receive her [place] records.   Will forward to medical records for an update.

## 2023-10-24 NOTE — Telephone Encounter (Signed)
 Attempted to call patient to give ROI update.  However, no answer or option for VM.

## 2023-10-24 NOTE — Telephone Encounter (Signed)
 Patient returned call to nurse line and LVM.   Attempted to call patient back, she did not answer and VM is not set up.   Chiquita JAYSON English, RN

## 2023-10-28 ENCOUNTER — Inpatient Hospital Stay: Attending: Oncology | Admitting: Oncology

## 2023-10-28 ENCOUNTER — Encounter: Payer: Self-pay | Admitting: Oncology

## 2023-10-28 ENCOUNTER — Inpatient Hospital Stay

## 2023-10-28 VITALS — BP 121/88 | HR 90 | Temp 98.6°F | Resp 16 | Ht 63.0 in | Wt 221.6 lb

## 2023-10-28 DIAGNOSIS — Z79899 Other long term (current) drug therapy: Secondary | ICD-10-CM | POA: Insufficient documentation

## 2023-10-28 DIAGNOSIS — I10 Essential (primary) hypertension: Secondary | ICD-10-CM | POA: Diagnosis not present

## 2023-10-28 DIAGNOSIS — E785 Hyperlipidemia, unspecified: Secondary | ICD-10-CM | POA: Diagnosis not present

## 2023-10-28 DIAGNOSIS — G629 Polyneuropathy, unspecified: Secondary | ICD-10-CM | POA: Diagnosis not present

## 2023-10-28 DIAGNOSIS — D472 Monoclonal gammopathy: Secondary | ICD-10-CM | POA: Diagnosis not present

## 2023-10-28 DIAGNOSIS — M797 Fibromyalgia: Secondary | ICD-10-CM | POA: Diagnosis not present

## 2023-10-28 DIAGNOSIS — Z7982 Long term (current) use of aspirin: Secondary | ICD-10-CM | POA: Insufficient documentation

## 2023-10-28 DIAGNOSIS — G6289 Other specified polyneuropathies: Secondary | ICD-10-CM

## 2023-10-28 DIAGNOSIS — D72820 Lymphocytosis (symptomatic): Secondary | ICD-10-CM | POA: Insufficient documentation

## 2023-10-28 DIAGNOSIS — I2510 Atherosclerotic heart disease of native coronary artery without angina pectoris: Secondary | ICD-10-CM | POA: Insufficient documentation

## 2023-10-28 LAB — CBC WITH DIFFERENTIAL (CANCER CENTER ONLY)
Abs Immature Granulocytes: 0.04 K/uL (ref 0.00–0.07)
Basophils Absolute: 0.1 K/uL (ref 0.0–0.1)
Basophils Relative: 0 %
Eosinophils Absolute: 0.2 K/uL (ref 0.0–0.5)
Eosinophils Relative: 2 %
HCT: 44.2 % (ref 36.0–46.0)
Hemoglobin: 15.1 g/dL — ABNORMAL HIGH (ref 12.0–15.0)
Immature Granulocytes: 0 %
Lymphocytes Relative: 35 %
Lymphs Abs: 4.5 K/uL — ABNORMAL HIGH (ref 0.7–4.0)
MCH: 30.8 pg (ref 26.0–34.0)
MCHC: 34.2 g/dL (ref 30.0–36.0)
MCV: 90 fL (ref 80.0–100.0)
Monocytes Absolute: 0.7 K/uL (ref 0.1–1.0)
Monocytes Relative: 6 %
Neutro Abs: 7.5 K/uL (ref 1.7–7.7)
Neutrophils Relative %: 57 %
Platelet Count: 262 K/uL (ref 150–400)
RBC: 4.91 MIL/uL (ref 3.87–5.11)
RDW: 12.6 % (ref 11.5–15.5)
WBC Count: 13.1 K/uL — ABNORMAL HIGH (ref 4.0–10.5)
nRBC: 0 % (ref 0.0–0.2)

## 2023-10-28 LAB — CMP (CANCER CENTER ONLY)
ALT: 14 U/L (ref 0–44)
AST: 19 U/L (ref 15–41)
Albumin: 4.3 g/dL (ref 3.5–5.0)
Alkaline Phosphatase: 86 U/L (ref 38–126)
Anion gap: 14 (ref 5–15)
BUN: 11 mg/dL (ref 6–20)
CO2: 21 mmol/L — ABNORMAL LOW (ref 22–32)
Calcium: 10.1 mg/dL (ref 8.9–10.3)
Chloride: 103 mmol/L (ref 98–111)
Creatinine: 0.71 mg/dL (ref 0.44–1.00)
GFR, Estimated: >60 mL/min (ref >60)
Glucose, Bld: 102 mg/dL — ABNORMAL HIGH (ref 70–99)
Potassium: 4 mmol/L (ref 3.5–5.1)
Sodium: 139 mmol/L (ref 135–145)
Total Bilirubin: 0.8 mg/dL (ref 0.0–1.2)
Total Protein: 7.9 g/dL (ref 6.5–8.1)

## 2023-10-28 LAB — LACTATE DEHYDROGENASE: LDH: 186 U/L (ref 98–192)

## 2023-10-28 NOTE — Progress Notes (Signed)
 [facility]  HEMATOLOGY CLINIC CONSULTATION NOTE   PATIENT NAME: Donna Norton   MR#: [id number] DOB: 14-Apr-1966  DATE OF SERVICE: 10/28/2023   REFERRING PROVIDER  [name], MD   Patient Care Team: [name], MD as PCP - General (Family Medicine) Thukkani, Arun K, MD as PCP - Cardiology (Cardiology)   REASON FOR CONSULTATION/ CHIEF COMPLAINT:  Abnormal SPEP  ASSESSMENT & PLAN:  Donna Norton is a 58 y.o. lady with a past medical history of prediabetes, polyneuropathy, CAD, dyslipidemia, hypertension, was referred to our service for evaluation of abnormal SPEP.    Monoclonal gammopathy As part of workup for polyneuropathy, SPEP was obtained in June 2025.  It showed faint amount of M spike at 0.2 g/dL.  No CRAB features.  Labs today showed normal hemoglobin of 15.1, MCV 90.  Mild relative lymphocytosis of 4500 noted.  White count was 13,100.  If persistent lymphocytosis is noted, we will obtain flow cytometry on return visit.  CMP showed creatinine of 0.71, calcium  normal at 10.1.  LDH normal.  We did check SPEP, IFE, quantitative immunoglobulins, serum free light chains today.  Will follow-up on the results and inform patient via MyChart.  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  She was provided reassurance.  RTC in 4 months with labs 2 weeks prior to return visit.  Peripheral neuropathy Tingling and numbness in the hands, extending past the elbows, worse on the right side. No clear etiology identified from the current workup. Differential diagnosis included amyloidosis, but this was ruled out based on current findings. - Monitor symptoms and report any changes  This is not contributed by monoclonal gammopathy/MGUS.    I reviewed lab results and outside records for this visit and discussed relevant results with the patient. Diagnosis, plan of care and treatment  options were also discussed in detail with the patient. Opportunity provided to ask questions and answers provided to her apparent satisfaction. Provided instructions to call our clinic with any problems, questions or concerns prior to return visit. I recommended to continue follow-up with PCP and sub-specialists. She verbalized understanding and agreed with the plan. No barriers to learning was detected.  Chinita Patten, MD  10/28/2023 4:29 PM  [facility] - A DEPT OF [facility] [address] [place] [address] Dept: [phone] Dept Fax: [phone]   HISTORY OF PRESENT ILLNESS:   Discussed the use of AI scribe software for clinical note transcription with the patient, who gave verbal consent to proceed.  History of Present Illness Donna Norton is a 58 year old female who presents with tingling and numbness in the hands and feet. She was referred by her primary doctor for evaluation of neuropathy.  She experiences tingling and numbness in her hands, extending past her elbows, with the right side more affected than the left. Blood tests revealed a small amount of M protein (0.2 grams), prompting further evaluation in our clinic.  She has intermittent hip and knee pain, attributed to daily activities such as walking up two flights of stairs. She describes her joints as 'very weak' and has difficulty with movements like getting in and out of the tub or rising from the floor.  She recalls a previous hospitalization for a severe infection following dental work, which led to neck pain and stiffness. This neck pain persists intermittently,  causing significant stiffness upon waking, despite changing pillows. She has not been admitted recently but visited the emergency room for this issue.  No new bone pains or joint pains beyond what she has described. No anemia, kidney dysfunction, or elevated calcium  levels.   She  denies fever, cough, diarrhea, or other infectious symptoms.  She denies epistaxis, bloody stool, melena, hematuria, bruising or other bleeding symptoms. She also denies unintentional weight loss, night sweats or other constitutional symptoms.  MEDICAL HISTORY Past Medical History:  Diagnosis Date   Anxiety    Arthritis    ANKLES AND FEET   Chronic cough since 2020 covid   Chronic headache    COVID 03-2019 AND 08-09-2020 RAPID HOME TEST   03-2019 SYMPTOMS X 6 MONTHS   Dyspnea    WITH EXERTION @TIMES  and talking with left lower lung per pt   Fibromyalgia    Generalized weakness    GERD (gastroesophageal reflux disease)    diet controlled   Hypertension    currently no meds, controlled, checks at home   Meningitis 2003 X 2   VIRAL   Pain    LEFT  SIDE HIP KNEE SHOULDER  and abdominal pain at times   Paresthesias    chronic   PNA (pneumonia) 03/2019   WITH COVID   Post-COVID syndrome    Sleep paralysis    OCC TROUBLE GETTING UP WHEN WAKES UP, PT CALLS THIS SLEEP PALSY   Spinal headache 2003   AFTER SPINAL TAP WITH MENIGITIS 2003   Thyroid  disease 2011-RESOLVED   HYPERTHRYOID     SURGICAL HISTORY Past Surgical History:  Procedure Laterality Date   COLONSCOPY  05/2020   POLYPS X 2 REMOVED ONE PRECANCEROUS ONE WAS BENIGN PER PT   DILATATION & CURETTAGE/HYSTEROSCOPY WITH MYOSURE N/A 10/14/2019   Procedure: DILATATION & CURETTAGE/HYSTEROSCOPY WITH MYOSURE;  Surgeon: Rosalva Sawyer, MD;  Location: [facility];  Service: Gynecology;  Laterality: N/A;   NO PAST SURGERIES     ROBOTIC ASSISTED LAPAROSCOPIC HYSTERECTOMY AND SALPINGECTOMY Bilateral 09/06/2020   Procedure: XI ROBOTIC ASSISTED LAPAROSCOPIC HYSTERECTOMY AND BILATERAL SALPING-OOPHERECTOMY.;  Surgeon: Rosalva Sawyer, MD;  Location: [facility];  Service: Gynecology;  Laterality: Bilateral;     SOCIAL HISTORY: She reports that she has never smoked. She has never used smokeless tobacco. She reports that  she does not currently use alcohol. She reports that she does not use drugs. Social History   Socioeconomic History   Marital status: Single    Spouse name: Not on file   Number of children: 3   Years of education: Not on file   Highest education level: Associate degree: occupational, Scientist, product/process development, or vocational program  Occupational History   Not on file  Tobacco Use   Smoking status: Never   Smokeless tobacco: Never  Vaping Use   Vaping status: Never Used  Substance and Sexual Activity   Alcohol use: Not Currently   Drug use: No   Sexual activity: Not on file  Other Topics Concern   Not on file  Social History Narrative   Lives with son in a one story home.  Has 3 children.     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     Education: some college.     Social Drivers of Health   Financial Resource Strain: Medium Risk (05/22/2023)   Overall Financial Resource Strain (CARDIA)    Difficulty of Paying Living Expenses: Somewhat hard  Food Insecurity: No Food Insecurity (10/28/2023)  Hunger Vital Sign    Worried About Running Out of Food in the Last Year: Never true    Ran Out of Food in the Last Year: Never true  Transportation Needs: No Transportation Needs (10/28/2023)   PRAPARE - Administrator, Civil Service (Medical): No    Lack of Transportation (Non-Medical): No  Physical Activity: Unknown (05/22/2023)   Exercise Vital Sign    Days of Exercise per Week: 0 days    Minutes of Exercise per Session: Not on file  Stress: Stress Concern Present (05/22/2023)   [employer] of Occupational Health - Occupational Stress Questionnaire    Feeling of Stress : Rather much  Social Connections: Moderately Integrated (05/22/2023)   Social Connection and Isolation Panel    Frequency of Communication with Friends and Family: Three times a week    Frequency of Social Gatherings with Friends and Family: Once a week    Attends Religious Services: More than 4 times per year     Active Member of [employer] or Organizations: Yes    Attends Engineer, structural: More than 4 times per year    Marital Status: Divorced  Intimate Partner Violence: Not At Risk (10/28/2023)   Humiliation, Afraid, Rape, and Kick questionnaire    Fear of Current or Ex-Partner: No    Emotionally Abused: No    Physically Abused: No    Sexually Abused: No    FAMILY HISTORY: Her family history includes Allergies in her child; Asthma in her grandchild; Cancer - Cervical in her sister; Diabetes in her father; Heart disease in her father; Hypertension in her father and mother; Thyroid  disease in her mother.  CURRENT MEDICATIONS   Current Outpatient Medications  Medication Instructions   albuterol  (PROAIR  HFA) 108 (90 Base) MCG/ACT inhaler 2 puffs, Inhalation, Every 6 hours PRN   aspirin  EC 81 mg, Oral, Daily, Swallow whole.   diclofenac  (VOLTAREN ) 50 mg, Oral, 2 times daily with meals   furosemide  (LASIX ) 20 mg, Oral, Daily   gabapentin  (NEURONTIN ) 100 mg, Oral, 3 times daily   ibuprofen  (ADVIL ) 200 mg, Every 6 hours PRN   Multiple Vitamins-Minerals (MULTIVITAMIN WITH MINERALS) tablet 1 tablet, Daily   valACYclovir (VALTREX) 500 MG tablet Take by mouth.     ALLERGIES  She is allergic to duloxetine, atorvastatin, ceclor [cefaclor], crestor  [rosuvastatin ], hydrocodone  bit-homatrop mbr, percocet [oxycodone -acetaminophen ], trazodone hcl, and methimazole.  REVIEW OF SYSTEMS:  Review of Systems - Oncology   Rest of the pertinent review of systems is unremarkable except as mentioned above in HPI.  PHYSICAL EXAMINATION:    Onc Performance Status - 10/28/23 1356       ECOG Perf Status   ECOG Perf Status Restricted in physically strenuous activity but ambulatory and able to carry out work of a light or sedentary nature, e.g., light house work, office work      KPS SCALE   KPS % SCORE Normal activity with effort, some s/s of disease          Vitals:   10/28/23 1352  BP: 121/88   Pulse: 90  Resp: 16  Temp: 98.6 F (37 C)  SpO2: 95%   Filed Weights   10/28/23 1352  Weight: 221 lb 9.6 oz (100.5 kg)    Physical Exam Constitutional:      General: She is not in acute distress.    Appearance: Normal appearance.  HENT:     Head: Normocephalic and atraumatic.  Cardiovascular:     Rate and Rhythm:  Normal rate.  Pulmonary:     Effort: Pulmonary effort is normal. No respiratory distress.  Abdominal:     General: There is no distension.  Neurological:     General: No focal deficit present.     Mental Status: She is alert and oriented to person, place, and time.  Psychiatric:        Mood and Affect: Mood normal.        Behavior: Behavior normal.       LABORATORY DATA:   I have reviewed the data as listed.  Results for orders placed or performed in visit on 10/28/23  Lactate dehydrogenase  Result Value Ref Range   LDH 186 98 - 192 U/L  CMP (Cancer Center only)  Result Value Ref Range   Sodium 139 135 - 145 mmol/L   Potassium 4.0 3.5 - 5.1 mmol/L   Chloride 103 98 - 111 mmol/L   CO2 21 (L) 22 - 32 mmol/L   Glucose, Bld 102 (H) 70 - 99 mg/dL   BUN 11 6 - 20 mg/dL   Creatinine 9.28 9.55 - 1.00 mg/dL   Calcium  10.1 8.9 - 10.3 mg/dL   Total Protein 7.9 6.5 - 8.1 g/dL   Albumin 4.3 3.5 - 5.0 g/dL   AST 19 15 - 41 U/L   ALT 14 0 - 44 U/L   Alkaline Phosphatase 86 38 - 126 U/L   Total Bilirubin 0.8 0.0 - 1.2 mg/dL   GFR, Estimated >39 >39 mL/min   Anion gap 14 5 - 15  CBC with Differential (Cancer Center Only)  Result Value Ref Range   WBC Count 13.1 (H) 4.0 - 10.5 K/uL   RBC 4.91 3.87 - 5.11 MIL/uL   Hemoglobin 15.1 (H) 12.0 - 15.0 g/dL   HCT 55.7 63.9 - 53.9 %   MCV 90.0 80.0 - 100.0 fL   MCH 30.8 26.0 - 34.0 pg   MCHC 34.2 30.0 - 36.0 g/dL   RDW 87.3 88.4 - 84.4 %   Platelet Count 262 150 - 400 K/uL   nRBC 0.0 0.0 - 0.2 %   Neutrophils Relative % 57 %   Neutro Abs 7.5 1.7 - 7.7 K/uL   Lymphocytes Relative 35 %   Lymphs Abs 4.5 (H) 0.7  - 4.0 K/uL   Monocytes Relative 6 %   Monocytes Absolute 0.7 0.1 - 1.0 K/uL   Eosinophils Relative 2 %   Eosinophils Absolute 0.2 0.0 - 0.5 K/uL   Basophils Relative 0 %   Basophils Absolute 0.1 0.0 - 0.1 K/uL   Immature Granulocytes 0 %   Abs Immature Granulocytes 0.04 0.00 - 0.07 K/uL     RADIOGRAPHIC STUDIES:  No pertinent imaging studies available to review.  Orders Placed This Encounter  Procedures   CBC with Differential (Cancer Center Only)    Standing Status:   Future    Number of Occurrences:   1    Expiration Date:   10/27/2024   CMP (Cancer Center only)    Standing Status:   Future    Number of Occurrences:   1    Expiration Date:   10/27/2024   Lactate dehydrogenase    Standing Status:   Future    Number of Occurrences:   1    Expiration Date:   10/27/2024   Kappa/lambda light chains    Standing Status:   Future    Number of Occurrences:   1    Expiration Date:   10/27/2024  Multiple Myeloma Panel (SPEP&IFE w/QIG)    Standing Status:   Future    Number of Occurrences:   1    Expiration Date:   10/27/2024   Beta 2 microglobulin, serum    Standing Status:   Future    Number of Occurrences:   1    Expiration Date:   10/27/2024   CBC with Differential (Cancer Center Only)    Standing Status:   Future    Expected Date:   02/28/2024    Expiration Date:   05/28/2024   CMP (Cancer Center only)    Standing Status:   Future    Expected Date:   02/28/2024    Expiration Date:   05/28/2024   Flow Cytometry, Peripheral Blood (Oncology)    Standing Status:   Future    Expected Date:   02/28/2024    Expiration Date:   05/28/2024   Multiple Myeloma Panel (SPEP&IFE w/QIG)    Standing Status:   Future    Expected Date:   02/28/2024    Expiration Date:   05/28/2024   Kappa/lambda light chains    Standing Status:   Future    Expected Date:   02/28/2024    Expiration Date:   05/28/2024   Lactate dehydrogenase    Standing Status:   Future    Expected Date:   02/28/2024    Expiration Date:    05/28/2024    Future Appointments  Date Time Provider Department Center  02/14/2024  9:00 AM [facility] PHLEBOTOMIST [facility] None  02/25/2024  9:45 AM Gor Vestal, Chinita, MD [facility] None    I spent a total of 40 minutes during this encounter with the patient including review of chart and various tests results, discussions about plan of care and coordination of care plan.  This document was completed utilizing speech recognition software. Grammatical errors, random word insertions, pronoun errors, and incomplete sentences are an occasional consequence of this system due to software limitations, ambient noise, and hardware issues. Any formal questions or concerns about the content, text or information contained within the body of this dictation should be directly addressed to the provider for clarification.

## 2023-10-28 NOTE — Assessment & Plan Note (Addendum)
 Tingling and numbness in the hands, extending past the elbows, worse on the right side. No clear etiology identified from the current workup. Differential diagnosis included amyloidosis, but this was ruled out based on current findings. - Monitor symptoms and report any changes  This is not contributed by monoclonal gammopathy/MGUS.

## 2023-10-28 NOTE — Assessment & Plan Note (Signed)
 As part of workup for polyneuropathy, SPEP was obtained in June 2025.  It showed faint amount of M spike at 0.2 g/dL.  No CRAB features.  Labs today showed normal hemoglobin of 15.1, MCV 90.  Mild relative lymphocytosis of 4500 noted.  White count was 13,100.  If persistent lymphocytosis is noted, we will obtain flow cytometry on return visit.  CMP showed creatinine of 0.71, calcium  normal at 10.1.  LDH normal.  We did check SPEP, IFE, quantitative immunoglobulins, serum free light chains today.  Will follow-up on the results and inform patient via MyChart.  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  She was provided reassurance.  RTC in 4 months with labs 2 weeks prior to return visit.

## 2023-10-29 LAB — KAPPA/LAMBDA LIGHT CHAINS
Kappa free light chain: 19.7 mg/L — ABNORMAL HIGH (ref 3.3–19.4)
Kappa, lambda light chain ratio: 1.11 (ref 0.26–1.65)
Lambda free light chains: 17.8 mg/L (ref 5.7–26.3)

## 2023-10-29 LAB — BETA 2 MICROGLOBULIN, SERUM: Beta-2 Microglobulin: 1.6 mg/L (ref 0.6–2.4)

## 2023-10-31 LAB — MULTIPLE MYELOMA PANEL, SERUM
Albumin SerPl Elph-Mcnc: 3.8 g/dL (ref 2.9–4.4)
Albumin/Glob SerPl: 1.2 (ref 0.7–1.7)
Alpha 1: 0.2 g/dL (ref 0.0–0.4)
Alpha2 Glob SerPl Elph-Mcnc: 0.8 g/dL (ref 0.4–1.0)
B-Globulin SerPl Elph-Mcnc: 1.3 g/dL (ref 0.7–1.3)
Gamma Glob SerPl Elph-Mcnc: 1 g/dL (ref 0.4–1.8)
Globulin, Total: 3.4 g/dL (ref 2.2–3.9)
IgA: 347 mg/dL (ref 87–352)
IgG (Immunoglobin G), Serum: 1213 mg/dL (ref 586–1602)
IgM (Immunoglobulin M), Srm: 111 mg/dL (ref 26–217)
M Protein SerPl Elph-Mcnc: 0.3 g/dL — ABNORMAL HIGH
Total Protein ELP: 7.2 g/dL (ref 6.0–8.5)

## 2023-11-01 ENCOUNTER — Telehealth: Payer: Self-pay

## 2023-11-01 ENCOUNTER — Other Ambulatory Visit: Payer: Self-pay | Admitting: Family Medicine

## 2023-11-01 DIAGNOSIS — Z021 Encounter for pre-employment examination: Secondary | ICD-10-CM

## 2023-11-01 DIAGNOSIS — G629 Polyneuropathy, unspecified: Secondary | ICD-10-CM

## 2023-11-01 DIAGNOSIS — M1990 Unspecified osteoarthritis, unspecified site: Secondary | ICD-10-CM

## 2023-11-01 MED ORDER — DICLOFENAC SODIUM 50 MG PO TBEC: 50.0000 mg | DELAYED_RELEASE_TABLET | Freq: Two times a day (BID) | ORAL | 0 refills | Status: DC

## 2023-11-01 NOTE — Telephone Encounter (Signed)
 Patient calls nurse line requesting medication refill on Voltaren  pills.   She is requesting 30 day supply.   Will forward request to PCP.   Chiquita JAYSON English, RN

## 2023-11-01 NOTE — Telephone Encounter (Signed)
 Patient calls nurse line to check status of form completion.   Donna Norton- have we received the medical records from the specialist?   Please advise.   Donna JAYSON English, RN

## 2023-11-04 ENCOUNTER — Other Ambulatory Visit: Payer: Self-pay | Admitting: Family Medicine

## 2023-11-04 DIAGNOSIS — Z021 Encounter for pre-employment examination: Secondary | ICD-10-CM

## 2023-11-04 DIAGNOSIS — Z111 Encounter for screening for respiratory tuberculosis: Secondary | ICD-10-CM

## 2023-11-04 NOTE — Telephone Encounter (Signed)
 Spoke with patient. Made nurse visit for 7/15 at 10:00am. Also going to get Tdap vaccine. Dr. Romelle is also putting in lab orders.. Arlin Savona, CMA

## 2023-11-05 ENCOUNTER — Ambulatory Visit (INDEPENDENT_AMBULATORY_CARE_PROVIDER_SITE_OTHER)

## 2023-11-05 DIAGNOSIS — Z23 Encounter for immunization: Secondary | ICD-10-CM

## 2023-11-05 DIAGNOSIS — Z111 Encounter for screening for respiratory tuberculosis: Secondary | ICD-10-CM

## 2023-11-05 DIAGNOSIS — Z021 Encounter for pre-employment examination: Secondary | ICD-10-CM

## 2023-11-05 NOTE — Progress Notes (Signed)
 Patient presents to nurse clinic for Tdap vaccine.  Vaccine administered without complication.  See admin for details.   Patient dropped off at the lab for Quant Gold and titers. Will await results.

## 2023-11-07 ENCOUNTER — Telehealth: Payer: Self-pay

## 2023-11-07 NOTE — Telephone Encounter (Signed)
 Called and scheduled patient Hep B vaccine for her 1st and 2nd dose. Patient will be coming in on 07/25 for the 1st one and 08/28 for the second one both at 10 a.m

## 2023-11-08 LAB — VARICELLA ZOSTER ANTIBODY, IGG: Varicella zoster IgG: REACTIVE

## 2023-11-08 LAB — QUANTIFERON-TB GOLD PLUS
QuantiFERON Mitogen Value: >10 [IU]/mL
QuantiFERON Nil Value: 0.04 [IU]/mL
QuantiFERON TB1 Ag Value: 0.08 [IU]/mL
QuantiFERON TB2 Ag Value: 0.06 [IU]/mL
QuantiFERON-TB Gold Plus: NEGATIVE

## 2023-11-08 LAB — HEPATITIS B SURFACE ANTIBODY, QUANTITATIVE: Hepatitis B Surf Ab Quant: <3.5 m[IU]/mL — ABNORMAL LOW

## 2023-11-08 LAB — MEASLES/MUMPS/RUBELLA IMMUNITY
MUMPS ABS, IGG: 111 [AU]/ml (ref >10.9)
RUBEOLA AB, IGG: >300 [AU]/ml (ref >16.4)
Rubella Antibodies, IGG: 22.7 {index} (ref >0.99)

## 2023-11-13 ENCOUNTER — Telehealth: Payer: Self-pay

## 2023-11-13 NOTE — Telephone Encounter (Signed)
 I sat aside a Hep B vaccine for the patient for her appointment. The vaccine is placed at the back of the fridge.

## 2023-11-13 NOTE — Telephone Encounter (Signed)
-----   Message from [facility] sent at 11/12/2023  1:27 PM EDT ----- Regarding: Form in box Hey -- this patient is scheduled for her first HepB vaccine on 7/25, RN visit  FYI I have signed her employee form and it's in my box on the bottom with a blue sticky on top  Ok to give her this form when she's here 7/25. Thanks  Atif

## 2023-11-15 ENCOUNTER — Ambulatory Visit

## 2023-11-15 DIAGNOSIS — Z23 Encounter for immunization: Secondary | ICD-10-CM

## 2023-11-15 NOTE — Progress Notes (Signed)
 Patient presents to nurse clinic for Hepatitis B vaccination. Denies fever in the last 24 hours or history of previous allergic reaction to vaccines.   Administered in LD, site unremarkable, tolerated injection well.   Provided patient with updated immunization record and paperwork completed by Dr. Romelle.   Patient has already been scheduled for second Hep B vaccine on 12/19/23.  Chiquita JAYSON English, RN

## 2023-11-22 ENCOUNTER — Encounter: Payer: Self-pay | Admitting: Family Medicine

## 2023-11-23 ENCOUNTER — Encounter: Payer: Self-pay | Admitting: Oncology

## 2023-11-26 ENCOUNTER — Telehealth: Payer: Self-pay | Admitting: Oncology

## 2023-11-26 NOTE — Telephone Encounter (Signed)
 PT called in stating that her kidneys are failing, her disease is progressing and that she is in really bad pain. Changed appt to 8/11 with Pasam and sent nurse a note to reach back out to her, due to pain.

## 2023-11-27 ENCOUNTER — Ambulatory Visit (INDEPENDENT_AMBULATORY_CARE_PROVIDER_SITE_OTHER): Admitting: Family Medicine

## 2023-11-27 ENCOUNTER — Telehealth: Payer: Self-pay

## 2023-11-27 VITALS — BP 114/81 | HR 68 | Ht 63.0 in | Wt 222.4 lb

## 2023-11-27 DIAGNOSIS — M545 Low back pain, unspecified: Secondary | ICD-10-CM

## 2023-11-27 DIAGNOSIS — D472 Monoclonal gammopathy: Secondary | ICD-10-CM | POA: Diagnosis not present

## 2023-11-27 MED ORDER — TRAMADOL HCL 50 MG PO TABS: 50.0000 mg | ORAL_TABLET | Freq: Four times a day (QID) | ORAL | 0 refills | Status: AC | PRN

## 2023-11-27 NOTE — Assessment & Plan Note (Addendum)
 Will repeat BMP today, given patient instructions to come in for check of kidney function.

## 2023-11-27 NOTE — Telephone Encounter (Signed)
 Followed up with patient via phone in regards to her leaving a message yesterday with scheduling stated  Kidneys are failing, pain is getting worse, bad hip pain, wanting to get prescribed pain medication.Patient stated today that her pain has decreased to mild, hands, feet, Abdominal area have started to swell which have been occurring almost 2 weeks, PCP prescribed Voltaren  gel on 10/09/23 to decrease with pain, reports not effective. having headaches that come/ go, slight vision changes, creatinine-0.71,GFR>60 were done on 10/723 at our office. Patient is concerned about Kidneys not working appropriately. Made aware to contact primary care doctor today to further evaluate symptoms, understood stated would go ahead and call them. No further questions.

## 2023-11-27 NOTE — Progress Notes (Signed)
    SUBJECTIVE:   CHIEF COMPLAINT / HPI: Discuss kidney function per cancer center  Has ongoing back and extremity in the setting of her peripheral neuropathy. Feels like she has been on the decline for awhile, but doing okay today. Pain improved with taking diclofenac .  Talked to nurse at oncology center today and was told to check kidney function. Has not been taking Gabapentin  due to issues with sleeping and feeling sleeping throughout the day. States her kidneys are acting up. Normal bowel movements. Seeing cancer center for monoclonal gammopathy. No dysuria or frequency.  Back pain ongoing for a week. Radiates up back, does not radiate now butt or legs. Constant sharp pain. Worse with standing up and walking. Specifically worse with bending forward. No incontinence. No new numbness or tingling in the past week. No falls.  Osteoporosis in the family. Has not broken any bones.  Creatinine on 10/28/2023 0.71.  PERTINENT  PMH / PSH: Hypertension, CAD, peripheral neuropathy, monoclonal, apathy  OBJECTIVE:   BP 114/81   Pulse 68   Ht 5' 3 (1.6 m)   Wt 222 lb 6.4 oz (100.9 kg)   LMP 04/23/2009   SpO2 99%   BMI 39.40 kg/m   General: NAD, well appearing Neuro: A&O Respiratory: normal WOB on RA Extremities: Moving all 4 extremities equally Back: no deformity or swelling, tenderness to palpation over lumbar spinous process L3-L5, normal gait, strength 5/5 bilateral lower extremity  ASSESSMENT/PLAN:   Assessment & Plan Acute midline low back pain without sciatica Exam and history in the setting of familial osteoporosis is mildly suggestive of possible lumbar compression fracture however I suspect this is likely all secondary to lumbar paraspinal muscle irritation/spasm based on severity of pain and exam.  No red flags. - Complete lumbar x-rays - Extensively discussed Voltaren  is contraindicated given history of angina and CAD - Acute course 50 mg tramadol  every 6  hours as needed send for pain - OTC Tylenol  discussed Monoclonal gammopathy Will repeat BMP today, given patient instructions to come in for check of kidney function.  Return if symptoms worsen or fail to improve.  Donna Provencal, MD [facility]

## 2023-11-27 NOTE — Patient Instructions (Signed)
 It was great to see you! Thank you for allowing me to participate in your care!  Our plans for today:  - You can go to the imaging center on [address] to get your back x-rays done. - I will let you know the results of these x-rays as well as your lab results. - Please make sure to follow-up with your regular doctor if you are having any further symptoms. - I do not recommend you take Voltaren . If you would like a different pain medicine I happy to prescribe a short course.   Please arrive 15 minutes PRIOR to your next scheduled appointment time! If you do not, this affects OTHER patients' care.  Take care and seek immediate care sooner if you develop any concerns.   Ozell Provencal, MD, PGY-3 [facility] 10:33 AM 11/27/2023  [facility]

## 2023-11-29 ENCOUNTER — Ambulatory Visit: Payer: Self-pay | Admitting: Family Medicine

## 2023-11-29 LAB — BASIC METABOLIC PANEL WITH GFR
BUN/Creatinine Ratio: 18 (ref 12–28)
BUN: 13 mg/dL (ref 8–27)
CO2: 22 mmol/L (ref 20–29)
Calcium: 9.8 mg/dL (ref 8.7–10.3)
Chloride: 100 mmol/L (ref 96–106)
Creatinine, Ser: 0.71 mg/dL (ref 0.57–1.00)
Glucose: 101 mg/dL — ABNORMAL HIGH (ref 70–99)
Potassium: 4.5 mmol/L (ref 3.5–5.2)
Sodium: 138 mmol/L (ref 134–144)
eGFR: 97 mL/min/1.73 (ref >59)

## 2023-12-01 ENCOUNTER — Other Ambulatory Visit: Payer: Self-pay | Admitting: Family Medicine

## 2023-12-01 DIAGNOSIS — M1990 Unspecified osteoarthritis, unspecified site: Secondary | ICD-10-CM

## 2023-12-01 DIAGNOSIS — G629 Polyneuropathy, unspecified: Secondary | ICD-10-CM

## 2023-12-02 ENCOUNTER — Inpatient Hospital Stay (HOSPITAL_BASED_OUTPATIENT_CLINIC_OR_DEPARTMENT_OTHER): Admitting: Oncology

## 2023-12-02 ENCOUNTER — Inpatient Hospital Stay: Attending: Oncology

## 2023-12-02 ENCOUNTER — Other Ambulatory Visit: Payer: Self-pay

## 2023-12-02 VITALS — BP 123/82 | HR 63 | Temp 98.2°F | Resp 16 | Ht 63.0 in | Wt 225.4 lb

## 2023-12-02 DIAGNOSIS — D472 Monoclonal gammopathy: Secondary | ICD-10-CM | POA: Insufficient documentation

## 2023-12-02 DIAGNOSIS — Z79899 Other long term (current) drug therapy: Secondary | ICD-10-CM | POA: Insufficient documentation

## 2023-12-02 DIAGNOSIS — D72820 Lymphocytosis (symptomatic): Secondary | ICD-10-CM | POA: Insufficient documentation

## 2023-12-02 LAB — CMP (CANCER CENTER ONLY)
ALT: 16 U/L (ref 0–44)
AST: 21 U/L (ref 15–41)
Albumin: 4.2 g/dL (ref 3.5–5.0)
Alkaline Phosphatase: 75 U/L (ref 38–126)
Anion gap: 13 (ref 5–15)
BUN: 11 mg/dL (ref 6–20)
CO2: 23 mmol/L (ref 22–32)
Calcium: 9.7 mg/dL (ref 8.9–10.3)
Chloride: 101 mmol/L (ref 98–111)
Creatinine: 0.71 mg/dL (ref 0.44–1.00)
GFR, Estimated: >60 mL/min (ref >60)
Glucose, Bld: 97 mg/dL (ref 70–99)
Potassium: 4.1 mmol/L (ref 3.5–5.1)
Sodium: 137 mmol/L (ref 135–145)
Total Bilirubin: 0.7 mg/dL (ref 0.0–1.2)
Total Protein: 7.6 g/dL (ref 6.5–8.1)

## 2023-12-02 LAB — CBC WITH DIFFERENTIAL (CANCER CENTER ONLY)
Abs Immature Granulocytes: 0.05 K/uL (ref 0.00–0.07)
Basophils Absolute: 0.1 K/uL (ref 0.0–0.1)
Basophils Relative: 1 %
Eosinophils Absolute: 0.2 K/uL (ref 0.0–0.5)
Eosinophils Relative: 2 %
HCT: 42.8 % (ref 36.0–46.0)
Hemoglobin: 14.9 g/dL (ref 12.0–15.0)
Immature Granulocytes: 1 %
Lymphocytes Relative: 42 %
Lymphs Abs: 3.8 K/uL (ref 0.7–4.0)
MCH: 31.4 pg (ref 26.0–34.0)
MCHC: 34.8 g/dL (ref 30.0–36.0)
MCV: 90.3 fL (ref 80.0–100.0)
Monocytes Absolute: 0.6 K/uL (ref 0.1–1.0)
Monocytes Relative: 6 %
Neutro Abs: 4.3 K/uL (ref 1.7–7.7)
Neutrophils Relative %: 48 %
Platelet Count: 235 K/uL (ref 150–400)
RBC: 4.74 MIL/uL (ref 3.87–5.11)
RDW: 12.4 % (ref 11.5–15.5)
WBC Count: 8.9 K/uL (ref 4.0–10.5)
nRBC: 0 % (ref 0.0–0.2)

## 2023-12-02 LAB — LACTATE DEHYDROGENASE: LDH: 180 U/L (ref 98–192)

## 2023-12-02 NOTE — Progress Notes (Signed)
 [facility]  HEMATOLOGY CLINIC PROGRESS NOTE  PATIENT NAME: Donna Norton   MR#: [id number] DOB: Jul 07, 1965  Patient Care Team: [name], MD as PCP - General (Family Medicine) Thukkani, Arun K, MD as PCP - Cardiology (Cardiology)  Date of visit: 12/02/2023   ASSESSMENT & PLAN:   Donna Norton is a 58 y.o. lady with a past medical history of prediabetes, polyneuropathy, CAD, dyslipidemia, hypertension, was referred to our service in July 2025 for evaluation of abnormal SPEP.     MGUS (monoclonal gammopathy of unknown significance) As part of workup for polyneuropathy, SPEP was obtained in June 2025.  It showed faint amount of M spike at 0.2 g/dL.  No CRAB features.  On her consultation with us  on 10/28/2023, labs showed normal hemoglobin of 15.1, MCV 90.  Mild relative lymphocytosis of 4500 noted.  White count was 13,100.  If persistent lymphocytosis is noted, we will obtain flow cytometry on return visit.  CMP showed creatinine of 0.71, calcium  normal at 10.1.  LDH normal.   SPEP showed 0.3 g/dL of M spike, IFE showed it to be IgG lambda type.  Quantitative immunoglobulins were within normal limits.  Serum free kappa was normal at 19.7 mg/L, lambda normal at 17.8 mg/L, ratio normal at 1.11.  Beta-2  microglobulin was normal at 1.6.  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  We did repeat myeloma labs and also checked flow cytometry of peripheral blood given intermittent lymphocytosis.  Will discuss results over the phone in the next 2 weeks.   She was provided reassurance.  RTC in 4 months with labs 2 weeks prior to return visit.  - Recommend follow-up with primary care physician for further evaluation of symptoms not related to MGUS. - Consider referral to a neurologist for headaches and neck pain. - Consider referral for GI workup, including colonoscopy or endoscopy, for  digestive issues.    I spent a total of 20 minutes during this encounter with the patient including review of chart and various tests results, discussions about plan of care and coordination of care plan.  I reviewed lab results and outside records for this visit and discussed relevant results with the patient. Diagnosis, plan of care and treatment options were also discussed in detail with the patient. Opportunity provided to ask questions and answers provided to her apparent satisfaction. Provided instructions to call our clinic with any problems, questions or concerns prior to return visit. I recommended to continue follow-up with PCP and sub-specialists. She verbalized understanding and agreed with the plan. No barriers to learning was detected.  Chinita Patten, MD  12/02/2023 10:38 AM  [facility] A DEPT OF [facility] [id number] [place] [address] Dept: [phone] Dept Fax: [phone]   CHIEF COMPLAINT/ REASON FOR VISIT:  IgG lambda MGUS  INTERVAL HISTORY:  Discussed the use of AI scribe software for clinical note transcription with the patient, who gave verbal consent to proceed.  History of Present Illness Donna Norton is a 58 year old female with monoclonal gammopathy of unknown significance (MGUS) who presents with joint pain and digestive issues. She is accompanied by her daughter, Eleanor.  She has been experiencing joint pain for the past month, with varying intensity, impacting her daily activities. She is cautious with her diet, avoiding sugar and junk food, and trying  to stay hydrated, but feels her body is different due to the pain.  Digestive issues have been present for the past week to week and a half, including constipation and discomfort during bowel movements. Certain foods cause back pain and prolonged digestion. She is consuming more vegetables, fruits, tea, and maintains one  cup of coffee in the morning with plenty of water.  She wakes up at night sweating profusely and has noticed weight gain despite eating only two mild meals a day. No unintentional weight loss. She experiences daily headaches and neck pain, with the neck pain starting about four to five months ago. She reports changes in urination and a recent reaction to a small amount of KM taken before coffee, which she believes may have worsened her symptoms.  Her past medical history includes a slightly abnormal serum protein electrophoresis (SPEP) test, with a faint M protein spike of 0.2 to 0.3 grams. She has not had any GI workup such as a colonoscopy or endoscopy yet.   SUMMARY OF HEMATOLOGIC HISTORY:  She experiences tingling and numbness in her hands, extending past her elbows, with the right side more affected than the left. Blood tests revealed a small amount of M protein (0.2 grams), prompting further evaluation in our clinic.   She has intermittent hip and knee pain, attributed to daily activities such as walking up two flights of stairs. She describes her joints as 'very weak' and has difficulty with movements like getting in and out of the tub or rising from the floor.   She recalls a previous hospitalization for a severe infection following dental work, which led to neck pain and stiffness. This neck pain persists intermittently, causing significant stiffness upon waking, despite changing pillows. She has not been admitted recently but visited the emergency room for this issue.   No new bone pains or joint pains beyond what she has described. No anemia, kidney dysfunction, or elevated calcium  levels.  As part of workup for polyneuropathy, SPEP was obtained in June 2025.  It showed faint amount of M spike at 0.2 g/dL.   No CRAB features.   On her consultation with us  on 10/28/2023, labs showed normal hemoglobin of 15.1, MCV 90.  Mild relative lymphocytosis of 4500 noted.  White count was 13,100.   If persistent lymphocytosis is noted, we will obtain flow cytometry on return visit.  CMP showed creatinine of 0.71, calcium  normal at 10.1.  LDH normal.   SPEP showed 0.3 g/dL of M spike, IFE showed it to be IgG lambda type.  Quantitative immunoglobulins were within normal limits.  Serum free kappa was normal at 19.7 mg/L, lambda normal at 17.8 mg/L, ratio normal at 1.11.  Beta-2  microglobulin was normal at 1.6.  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   She was provided reassurance.  I have reviewed the past medical history, past surgical history, social history and family history with the patient and they are unchanged from previous note.  ALLERGIES: She is allergic to duloxetine, atorvastatin, ceclor [cefaclor], crestor  [rosuvastatin ], hydrocodone  bit-homatrop mbr, percocet [oxycodone -acetaminophen ], trazodone hcl, and methimazole.  MEDICATIONS:  Current Outpatient Medications  Medication Sig Dispense Refill   acetaminophen  (TYLENOL ) 500 MG tablet Take 1,000 mg by mouth every 6 (six) hours as needed for moderate pain (pain score 4-6).     albuterol  (PROAIR  HFA) 108 (90 Base) MCG/ACT inhaler Inhale 2 puffs into the lungs every 6 (six)  hours as needed for wheezing or shortness of breath. 1 each 2   aspirin  EC 81 MG tablet Take 1 tablet (81 mg total) by mouth daily. Swallow whole. 90 tablet 3   Multiple Vitamins-Minerals (MULTIVITAMIN WITH MINERALS) tablet Take 1 tablet by mouth daily.     valACYclovir (VALTREX) 500 MG tablet Take by mouth.     diclofenac  (VOLTAREN ) 50 MG EC tablet TAKE 1 TABLET (50 MG TOTAL) BY MOUTH 2 (TWO) TIMES DAILY WITH A MEAL. (Patient not taking: Reported on 12/02/2023) 60 tablet 0   furosemide  (LASIX ) 20 MG tablet Take 1 tablet (20 mg total) by mouth daily. (Patient not taking: Reported on 12/02/2023) 15 tablet 0   gabapentin  (NEURONTIN ) 100 MG capsule Take 1 capsule (100 mg total) by  mouth 3 (three) times daily. (Patient not taking: Reported on 12/02/2023) 90 capsule 3   ibuprofen  (ADVIL ) 200 MG tablet Take 200 mg by mouth every 6 (six) hours as needed. (Patient not taking: Reported on 12/02/2023)     traMADol  (ULTRAM ) 50 MG tablet Take 1 tablet (50 mg total) by mouth every 6 (six) hours as needed for up to 5 days. (Patient not taking: Reported on 12/02/2023) 12 tablet 0   No current facility-administered medications for this visit.     REVIEW OF SYSTEMS:    Review of Systems - Oncology  All other pertinent systems were reviewed with the patient and are negative.  PHYSICAL EXAMINATION:    Onc Performance Status - 12/02/23 0953       ECOG Perf Status   ECOG Perf Status Restricted in physically strenuous activity but ambulatory and able to carry out work of a light or sedentary nature, e.g., light house work, office work      KPS SCALE   KPS % SCORE Normal activity with effort, some s/s of disease          Vitals:   12/02/23 0939  BP: 123/82  Pulse: 63  Resp: 16  Temp: 98.2 F (36.8 C)  SpO2: 99%   Filed Weights   12/02/23 0939  Weight: 225 lb 6.4 oz (102.2 kg)    Physical Exam Constitutional:      General: She is not in acute distress.    Appearance: Normal appearance.  HENT:     Head: Normocephalic and atraumatic.  Cardiovascular:     Rate and Rhythm: Normal rate.  Pulmonary:     Effort: Pulmonary effort is normal. No respiratory distress.  Abdominal:     General: There is no distension.  Neurological:     General: No focal deficit present.     Mental Status: She is alert and oriented to person, place, and time.  Psychiatric:        Mood and Affect: Mood normal.        Behavior: Behavior normal.     LABORATORY DATA:   I have reviewed the data as listed.  Results for orders placed or performed in visit on 12/02/23  CBC with Differential (Cancer Center Only)  Result Value Ref Range   WBC Count 8.9 4.0 - 10.5 K/uL   RBC 4.74 3.87  - 5.11 MIL/uL   Hemoglobin 14.9 12.0 - 15.0 g/dL   HCT 57.1 63.9 - 53.9 %   MCV 90.3 80.0 - 100.0 fL   MCH 31.4 26.0 - 34.0 pg   MCHC 34.8 30.0 - 36.0 g/dL   RDW 87.5 88.4 - 84.4 %   Platelet Count 235 150 - 400 K/uL  nRBC 0.0 0.0 - 0.2 %   Neutrophils Relative % 48 %   Neutro Abs 4.3 1.7 - 7.7 K/uL   Lymphocytes Relative 42 %   Lymphs Abs 3.8 0.7 - 4.0 K/uL   Monocytes Relative 6 %   Monocytes Absolute 0.6 0.1 - 1.0 K/uL   Eosinophils Relative 2 %   Eosinophils Absolute 0.2 0.0 - 0.5 K/uL   Basophils Relative 1 %   Basophils Absolute 0.1 0.0 - 0.1 K/uL   Immature Granulocytes 1 %   Abs Immature Granulocytes 0.05 0.00 - 0.07 K/uL    RADIOGRAPHIC STUDIES:  No recent pertinent imaging studies available to review.   Future Appointments  Date Time Provider Department Center  12/16/2023  3:30 PM Krisi Azua, Chinita, MD [facility] None  12/19/2023 10:00 AM [facility] NURSE [facility]  03/17/2024  3:00 PM [facility] PHLEBOTOMIST [facility] None  03/31/2024  3:00 PM Demarus Latterell, Chinita, MD [facility] None     This document was completed utilizing speech recognition software. Grammatical errors, random word insertions, pronoun errors, and incomplete sentences are an occasional consequence of this system due to software limitations, ambient noise, and hardware issues. Any formal questions or concerns about the content, text or information contained within the body of this dictation should be directly addressed to the provider for clarification.

## 2023-12-03 LAB — SURGICAL PATHOLOGY

## 2023-12-03 LAB — KAPPA/LAMBDA LIGHT CHAINS
Kappa free light chain: 17.6 mg/L (ref 3.3–19.4)
Kappa, lambda light chain ratio: 1.02 (ref 0.26–1.65)
Lambda free light chains: 17.2 mg/L (ref 5.7–26.3)

## 2023-12-04 LAB — MULTIPLE MYELOMA PANEL, SERUM
Albumin SerPl Elph-Mcnc: 3.5 g/dL (ref 2.9–4.4)
Albumin/Glob SerPl: 1.1 (ref 0.7–1.7)
Alpha 1: 0.2 g/dL (ref 0.0–0.4)
Alpha2 Glob SerPl Elph-Mcnc: 0.8 g/dL (ref 0.4–1.0)
B-Globulin SerPl Elph-Mcnc: 1.3 g/dL (ref 0.7–1.3)
Gamma Glob SerPl Elph-Mcnc: 1.1 g/dL (ref 0.4–1.8)
Globulin, Total: 3.5 g/dL (ref 2.2–3.9)
IgA: 325 mg/dL (ref 87–352)
IgG (Immunoglobin G), Serum: 1168 mg/dL (ref 586–1602)
IgM (Immunoglobulin M), Srm: 112 mg/dL (ref 26–217)
M Protein SerPl Elph-Mcnc: 0.3 g/dL — ABNORMAL HIGH
Total Protein ELP: 7 g/dL (ref 6.0–8.5)

## 2023-12-05 LAB — FLOW CYTOMETRY

## 2023-12-06 ENCOUNTER — Encounter: Payer: Self-pay | Admitting: Oncology

## 2023-12-06 NOTE — Assessment & Plan Note (Addendum)
 As part of workup for polyneuropathy, SPEP was obtained in June 2025.  It showed faint amount of M spike at 0.2 g/dL.  No CRAB features.  On her consultation with us  on 10/28/2023, labs showed normal hemoglobin of 15.1, MCV 90.  Mild relative lymphocytosis of 4500 noted.  White count was 13,100.  If persistent lymphocytosis is noted, we will obtain flow cytometry on return visit.  CMP showed creatinine of 0.71, calcium  normal at 10.1.  LDH normal.   SPEP showed 0.3 g/dL of M spike, IFE showed it to be IgG lambda type.  Quantitative immunoglobulins were within normal limits.  Serum free kappa was normal at 19.7 mg/L, lambda normal at 17.8 mg/L, ratio normal at 1.11.  Beta-2  microglobulin was normal at 1.6.  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  We did repeat myeloma labs and also checked flow cytometry of peripheral blood given intermittent lymphocytosis.  Will discuss results over the phone in the next 2 weeks.   She was provided reassurance.  RTC in 4 months with labs 2 weeks prior to return visit.  - Recommend follow-up with primary care physician for further evaluation of symptoms not related to MGUS. - Consider referral to a neurologist for headaches and neck pain. - Consider referral for GI workup, including colonoscopy or endoscopy, for digestive issues.

## 2023-12-09 ENCOUNTER — Telehealth: Payer: Self-pay | Admitting: Oncology

## 2023-12-16 ENCOUNTER — Inpatient Hospital Stay: Admitting: Oncology

## 2023-12-16 ENCOUNTER — Encounter: Payer: Self-pay | Admitting: Oncology

## 2023-12-16 DIAGNOSIS — D472 Monoclonal gammopathy: Secondary | ICD-10-CM

## 2023-12-16 NOTE — Assessment & Plan Note (Addendum)
 As part of workup for polyneuropathy, SPEP was obtained in June 2025.  It showed faint amount of M spike at 0.2 g/dL.  No CRAB features.  On her consultation with us  on 10/28/2023, labs showed normal hemoglobin of 15.1, MCV 90.  Mild relative lymphocytosis of 4500 noted.  White count was 13,100.  If persistent lymphocytosis is noted, we will obtain flow cytometry on return visit.  CMP showed creatinine of 0.71, calcium  normal at 10.1.  LDH normal.   SPEP showed 0.3 g/dL of M spike, IFE showed it to be IgG lambda type.  Quantitative immunoglobulins were within normal limits.  Serum free kappa was normal at 19.7 mg/L, lambda normal at 17.8 mg/L, ratio normal at 1.11.  Beta-2  microglobulin was normal at 1.6.  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  On 12/02/2023, we did repeat myeloma labs and also checked flow cytometry of peripheral blood given intermittent lymphocytosis.  Picture remains consistent with MGUS with very small amount of M spike at 0.3 g/dL.  Free kappa and lambda were also within normal limits currently.  Flow cytometry of peripheral blood was unremarkable.   She was provided reassurance.  RTC in 4 months with labs 2 weeks prior to return visit.  - Recommend follow-up with primary care physician for further evaluation of symptoms not related to MGUS. - Consider referral to a neurologist for headaches and neck pain. - Consider referral for GI workup, including colonoscopy or endoscopy, for digestive issues.

## 2023-12-16 NOTE — Progress Notes (Signed)
 Donna Norton  HEMATOLOGY-ONCOLOGY ELECTRONIC VISIT PROGRESS NOTE  PATIENT NAME: Donna Norton   MR#: [id number] DOB: 27-Sep-1965  DATE OF SERVICE: 12/16/2023  Patient Care Team: [name], MD as PCP - General (Family Medicine) Thukkani, Arun K, MD as PCP - Cardiology (Cardiology)  I connected with the patient via telephone conference and verified that I am speaking with the correct person using two identifiers. The patient's location is at home and I am providing care from the [facility].  I discussed the limitations, risks, security and privacy concerns of performing an evaluation and management service by e-visits and the availability of in person appointments. I also discussed with the patient that there may be a patient responsible charge related to this service. The patient expressed understanding and agreed to proceed.   ASSESSMENT & PLAN:   Donna Norton is a 58 y.o. lady with a past medical history of prediabetes, polyneuropathy, CAD, dyslipidemia, hypertension, was referred to our service in July 2025 for evaluation of abnormal SPEP. 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     MGUS (monoclonal gammopathy of unknown significance) As part of workup for polyneuropathy, SPEP was obtained in June 2025.  It showed faint amount of M spike at 0.2 g/dL.  No CRAB features.  On her consultation with us  on 10/28/2023, labs showed normal hemoglobin of 15.1, MCV 90.  Mild relative lymphocytosis of 4500 noted.  White count was 13,100.  If persistent lymphocytosis is noted, we will obtain flow cytometry on return visit.  CMP showed creatinine of 0.71, calcium  normal at 10.1.  LDH normal.   SPEP showed 0.3 g/dL of M spike, IFE showed it to be IgG lambda type.  Quantitative immunoglobulins were within normal limits.  Serum free kappa was normal at 19.7 mg/L, lambda normal at 17.8 mg/L, ratio normal at 1.11.  Beta-2  microglobulin was normal at 1.6.  Clinical picture is  indicative of IgG lambda MGUS at this time given small amount of M protein and absence of CRAB features.  On 12/02/2023, we did repeat myeloma labs and also checked flow cytometry of peripheral blood given intermittent lymphocytosis.  Picture remains consistent with MGUS with very small amount of M spike at 0.3 g/dL.  Free kappa and lambda were also within normal limits currently.  Flow cytometry of peripheral blood was unremarkable.   She was provided reassurance.  RTC in 4 months with labs 2 weeks prior to return visit.  - Recommend follow-up with primary care physician for further evaluation of symptoms not related to MGUS. - Consider referral to a neurologist for headaches and neck pain. - Consider referral for GI workup, including colonoscopy or endoscopy, for digestive issues.   Chronic fatigue with associated fibromyalgia, neck and upper back pain, and shortness of breath Chronic fatigue significantly impairs daily functioning, with difficulty in physical activities such as lifting and climbing stairs. Associated symptoms include neck and upper back pain, stiffness, and shortness of breath, worsening over the past few months. Previous lab work, including myeloma workup, was normal. Differential diagnosis includes post-COVID syndrome and potential neurological or cardiological issues. - Her PCP to refer to neurology for further evaluation of chronic fatigue and associated symptoms. - Encourage follow-up with primary care physician for potential referral to cardiology.  Head pressure and heaviness Persistent head pressure and heaviness, described as tightness and heaviness, sometimes associated with pain. Symptoms present for several months, occasionally alleviated by movement. Differential diagnosis includes neurological causes, possibly related to previous COVID  infection.  I discussed the assessment and treatment plan with the patient. The patient was provided an opportunity to ask  questions and all were answered. The patient agreed with the plan and demonstrated an understanding of the instructions. The patient was advised to call back or seek an in-person evaluation if the symptoms worsen or if the condition fails to improve as anticipated.    I spent 22 minutes over the phone with the patient reviewing test results, discuss management and coordination/planning of care.  Chinita Patten, MD 12/16/2023 5:07 PM [place] [facility] - A DEPT OF [facility] [address] [place] [address] Dept: [phone] Dept Fax: [phone]   INTERVAL HISTORY:  Please see above for problem oriented charting.  The purpose of today's discussion is to explain recent lab results and to formulate plan of care.  Discussed the use of AI scribe software for clinical note transcription with the patient, who gave verbal consent to proceed.  History of Present Illness Donna Norton is a 58 year old female with fibromyalgia who presents with worsening fatigue and musculoskeletal pain.  Over the past week, she has experienced a significant increase in fatigue, describing it as 'horrible' and noting its impact on her ability to perform daily activities. Fatigue is present upon waking and persists throughout the day, worsening with physical activity. She reports difficulty working and is seeking employment that does not require heavy lifting due to her symptoms.  She experiences episodes of lightheadedness and a sensation of nearly passing out, particularly after returning home from outings. Despite eating three small meals a day, she continues to experience nausea and heart palpitations, which she associates with low blood sugar levels.  She reports waking up with shortness of breath and a sensation of pressure in her head, described as 'heavy' and 'tight'. This pressure sometimes subsides with movement but can persist  throughout the day.  She experiences pain and stiffness in her upper back and neck, present for the last two to three months and recently worsening.  She has a history of fibromyalgia and notes that her current symptoms differ from previous experiences with the condition. She is concerned about her history of high cholesterol and its potential impact on her current symptoms.  She has had COVID-19 three times, with the first infection resulting in prolonged symptoms lasting over a year. She wonders if her current symptoms are related to these past infections.  No smoking, alcohol, or drug use. She lives in a second-floor apartment and is currently unable to work due to her symptoms.    SUMMARY OF HEMATOLOGY HISTORY:  She experiences tingling and numbness in her hands, extending past her elbows, with the right side more affected than the left. Blood tests revealed a small amount of M protein (0.2 grams), prompting further evaluation in our clinic.   She has intermittent hip and knee pain, attributed to daily activities such as walking up two flights of stairs. She describes her joints as 'very weak' and has difficulty with movements like getting in and out of the tub or rising from the floor.   She recalls a previous hospitalization for a severe infection following dental work, which led to neck pain and stiffness. This neck pain persists intermittently, causing significant stiffness upon waking, despite changing pillows. She has not been admitted recently but visited the emergency room for this issue.   No new bone pains or joint pains beyond what she  has described. No anemia, kidney dysfunction, or elevated calcium  levels.   As part of workup for polyneuropathy, SPEP was obtained in June 2025.  It showed faint amount of M spike at 0.2 g/dL.   No CRAB features.   On her consultation with us  on 10/28/2023, labs showed normal hemoglobin of 15.1, MCV 90.  Mild relative lymphocytosis of 4500  noted.  White count was 13,100.  If persistent lymphocytosis is noted, we will obtain flow cytometry on return visit.  CMP showed creatinine of 0.71, calcium  normal at 10.1.  LDH normal.   SPEP showed 0.3 g/dL of M spike, IFE showed it to be IgG lambda type.  Quantitative immunoglobulins were within normal limits.  Serum free kappa was normal at 19.7 mg/L, lambda normal at 17.8 mg/L, ratio normal at 1.11.  Beta-2  microglobulin was normal at 1.6.  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   She was provided reassurance.  REVIEW OF SYSTEMS:    Review of Systems - Oncology  All other pertinent systems were reviewed with the patient and are negative.  I have reviewed the past medical history, past surgical history, social history and family history with the patient and they are unchanged from previous note.  ALLERGIES:  She is allergic to duloxetine , atorvastatin, ceclor [cefaclor], crestor  [rosuvastatin ], hydrocodone  bit-homatrop mbr, percocet [oxycodone -acetaminophen ], trazodone hcl, and methimazole.  MEDICATIONS:  Current Outpatient Medications  Medication Sig Dispense Refill   acetaminophen  (TYLENOL ) 500 MG tablet Take 1,000 mg by mouth every 6 (six) hours as needed for moderate pain (pain score 4-6).     albuterol  (PROAIR  HFA) 108 (90 Base) MCG/ACT inhaler Inhale 2 puffs into the lungs every 6 (six) hours as needed for wheezing or shortness of breath. 1 each 2   aspirin  EC 81 MG tablet Take 1 tablet (81 mg total) by mouth daily. Swallow whole. 90 tablet 3   diclofenac  (VOLTAREN ) 50 MG EC tablet TAKE 1 TABLET (50 MG TOTAL) BY MOUTH 2 (TWO) TIMES DAILY WITH A MEAL. (Patient not taking: Reported on 12/02/2023) 60 tablet 0   furosemide  (LASIX ) 20 MG tablet Take 1 tablet (20 mg total) by mouth daily. (Patient not taking: Reported on 12/02/2023) 15 tablet 0   gabapentin  (NEURONTIN ) 100 MG capsule Take 1 capsule (100  mg total) by mouth 3 (three) times daily. (Patient not taking: Reported on 12/02/2023) 90 capsule 3   ibuprofen  (ADVIL ) 200 MG tablet Take 200 mg by mouth every 6 (six) hours as needed. (Patient not taking: Reported on 12/02/2023)     Multiple Vitamins-Minerals (MULTIVITAMIN WITH MINERALS) tablet Take 1 tablet by mouth daily.     valACYclovir (VALTREX) 500 MG tablet Take by mouth.     No current facility-administered medications for this visit.    PHYSICAL EXAMINATION:   Onc Performance Status - 12/16/23 1500       ECOG Perf Status   ECOG Perf Status Restricted in physically strenuous activity but ambulatory and able to carry out work of a light or sedentary nature, e.g., light house work, office work      KPS SCALE   KPS % SCORE Normal activity with effort, some s/s of disease          LABORATORY DATA:   I have reviewed the data as listed.  Recent Results (from the past 2160 hours)  HgB A1c     Status: None   Collection Time: 10/01/23  2:17 PM  Result Value Ref Range   Hemoglobin A1C     HbA1c POC (<> result, manual entry)     HbA1c, POC (prediabetic range) 5.7 5.7 - 6.4 %   HbA1c, POC (controlled diabetic range)    TSH Rfx on Abnormal to Free T4     Status: None   Collection Time: 10/01/23  3:32 PM  Result Value Ref Range   TSH 2.780 0.450 - 4.500 uIU/mL  Vitamin B12     Status: None   Collection Time: 10/01/23  5:27 PM  Result Value Ref Range   Vitamin B-12 565 232 - 1,245 pg/mL  Protein electrophoresis, serum     Status: Abnormal   Collection Time: 10/01/23  5:27 PM  Result Value Ref Range   Albumin ELP 3.4 2.9 - 4.4 g/dL   Alpha 1 0.2 0.0 - 0.4 g/dL   Alpha 2 0.8 0.4 - 1.0 g/dL   Beta 1.3 0.7 - 1.3 g/dL   Gamma Globulin 1.1 0.4 - 1.8 g/dL   M-Spike, % 0.3 (H) Not Observed g/dL   Globulin, Total 3.4 2.2 - 3.9 g/dL   A/G Ratio 1.0 0.7 - 1.7   Please Note: Comment     Comment: Protein electrophoresis scan will follow via computer, mail, or courier delivery.     Interpretation: Comment     Comment: The SPE pattern demonstrates a single peak (M-spike) in the gamma region which may represent monoclonal protein. This peak may also be caused by circulating immune complexes, cryoglobulins, C-reactive protein, fibrinogen or hemolysis.  If clinically indicated, the presence of a monoclonal gammopathy may be confirmed by immuno- fixation, as well as an evaluation of the urine for the presence of Bence-Jones protein.   CBC with Differential     Status: Abnormal   Collection Time: 10/01/23  5:27 PM  Result Value Ref Range   WBC 13.3 (H) 3.4 - 10.8 x10E3/uL   RBC 4.68 3.77 - 5.28 x10E6/uL   Hemoglobin 14.7 11.1 - 15.9 g/dL   Hematocrit 56.5 65.9 - 46.6 %   MCV 93 79 - 97 fL   MCH 31.4 26.6 - 33.0 pg   MCHC 33.9 31.5 - 35.7 g/dL   RDW 87.0 88.2 - 84.5 %   Platelets 250 150 - 450 x10E3/uL   Neutrophils 56 Not Estab. %   Lymphs 35 Not Estab. %   Monocytes 6 Not Estab. %   Eos 2 Not Estab. %   Basos 1 Not Estab. %   Neutrophils Absolute 7.5 (H) 1.4 - 7.0 x10E3/uL   Lymphocytes Absolute 4.7 (H) 0.7 - 3.1 x10E3/uL   Monocytes Absolute 0.8 0.1 - 0.9 x10E3/uL   EOS (ABSOLUTE) 0.3 0.0 - 0.4 x10E3/uL   Basophils Absolute 0.1 0.0 - 0.2 x10E3/uL   Immature Granulocytes 0 Not Estab. %   Immature Grans (Abs) 0.0 0.0 - 0.1 x10E3/uL  Comprehensive metabolic panel with GFR     Status: Abnormal   Collection Time: 10/01/23  5:27 PM  Result Value Ref Range   Glucose 96 70 - 99 mg/dL   BUN 14 8 - 27 mg/dL   Creatinine, Ser 9.26 0.57 - 1.00 mg/dL   eGFR 94 >40 fO/fpw/8.26   BUN/Creatinine Ratio 19 12 - 28   Sodium 139 134 - 144 mmol/L   Potassium 3.8 3.5 - 5.2 mmol/L   Chloride 103 96 - 106 mmol/L   CO2 18 (L) 20 - 29 mmol/L   Calcium  9.1 8.7 - 10.3 mg/dL  Total Protein 6.8 6.0 - 8.5 g/dL   Albumin 4.1 3.8 - 4.9 g/dL   Globulin, Total 2.7 1.5 - 4.5 g/dL   Bilirubin Total 0.3 0.0 - 1.2 mg/dL   Alkaline Phosphatase 85 44 - 121 IU/L   AST 18 0 - 40 IU/L    ALT 17 0 - 32 IU/L  HIV antibody (with reflex)     Status: None   Collection Time: 10/01/23  5:27 PM  Result Value Ref Range   HIV Screen 4th Generation wRfx Non Reactive Non Reactive    Comment: HIV-1/HIV-2 antibodies and HIV-1 p24 antigen were NOT detected. There is no laboratory evidence of HIV infection. HIV Negative   CBC with Differential     Status: Abnormal   Collection Time: 10/09/23 10:11 AM  Result Value Ref Range   WBC 8.2 3.4 - 10.8 x10E3/uL   RBC 4.76 3.77 - 5.28 x10E6/uL   Hemoglobin 14.8 11.1 - 15.9 g/dL   Hematocrit 55.5 65.9 - 46.6 %   MCV 93 79 - 97 fL   MCH 31.1 26.6 - 33.0 pg   MCHC 33.3 31.5 - 35.7 g/dL   RDW 87.1 88.2 - 84.5 %   Platelets 239 150 - 450 x10E3/uL   Neutrophils 48 Not Estab. %   Lymphs 43 Not Estab. %   Monocytes 6 Not Estab. %   Eos 2 Not Estab. %   Basos 1 Not Estab. %   Neutrophils Absolute 3.9 1.4 - 7.0 x10E3/uL   Lymphocytes Absolute 3.6 (H) 0.7 - 3.1 x10E3/uL   Monocytes Absolute 0.5 0.1 - 0.9 x10E3/uL   EOS (ABSOLUTE) 0.2 0.0 - 0.4 x10E3/uL   Basophils Absolute 0.1 0.0 - 0.2 x10E3/uL   Immature Granulocytes 0 Not Estab. %   Immature Grans (Abs) 0.0 0.0 - 0.1 x10E3/uL  ANA w/Reflex     Status: None   Collection Time: 10/09/23 10:11 AM  Result Value Ref Range   Anti Nuclear Antibody (ANA) Negative Negative  PE and FLC, Serum     Status: Abnormal   Collection Time: 10/09/23 10:11 AM  Result Value Ref Range   Total Protein 6.6 6.0 - 8.5 g/dL   Albumin ELP 3.3 2.9 - 4.4 g/dL   Alpha 1 0.2 0.0 - 0.4 g/dL   Alpha 2 0.8 0.4 - 1.0 g/dL   Beta 1.2 0.7 - 1.3 g/dL   Gamma Globulin 1.1 0.4 - 1.8 g/dL   M-Spike, % 0.2 (H) Not Observed g/dL   Globulin, Total 3.3 2.2 - 3.9 g/dL   A/G Ratio 1.0 0.7 - 1.7   Please Note: Comment     Comment: Protein electrophoresis scan will follow via computer, mail, or courier delivery.    Ig Kappa Free Light Chain 18.0 3.3 - 19.4 mg/L   Ig Lambda Free Light Chain 16.9 5.7 - 26.3 mg/L   KAPPA/LAMBDA  RATIO 1.07 0.26 - 1.65  Immunofixation (IFE), Serum     Status: Abnormal   Collection Time: 10/09/23 10:11 AM  Result Value Ref Range   Immunofixation Result, Serum Comment (A)     Comment: Immunofixation shows IgG monoclonal protein with lambda light chain specificity. Monoclonal bands detected are faint. Suggest retesting in 4-6 months.    IgG (Immunoglobin G), Serum 1,102 586 - 1,602 mg/dL   IgA/Immunoglobulin A, Serum 314 87 - 352 mg/dL   IgM (Immunoglobulin M), Srm 110 26 - 217 mg/dL  Beta 2 microglobulin, serum     Status: None   Collection Time: 10/28/23  2:26 PM  Result Value Ref Range   Beta-2  Microglobulin 1.6 0.6 - 2.4 mg/L    Comment: (NOTE) Siemens Immulite 2000 Immunochemiluminometric assay (ICMA) Values obtained with different assay methods or kits cannot be used interchangeably. Results cannot be interpreted as absolute evidence of the presence or absence of malignant disease. Performed At: [facility] [address] [place], [place] [id number] Jennette Shorter MD Ey:[id number]   Kappa/lambda light chains     Status: Abnormal   Collection Time: 10/28/23  2:26 PM  Result Value Ref Range   Kappa free light chain 19.7 (H) 3.3 - 19.4 mg/L   Lambda free light chains 17.8 5.7 - 26.3 mg/L   Kappa, lambda light chain ratio 1.11 0.26 - 1.65    Comment: (NOTE) Performed At: [facility]  [address] [place], [place] [id number] Jennette Shorter MD Ey:[id number]   Lactate dehydrogenase     Status: None   Collection Time: 10/28/23  2:26 PM  Result Value Ref Range   LDH 186 98 - 192 U/L    Comment: Performed at [facility], [address] [place], [place] [address]  CMP ([facility] only)     Status: Abnormal   Collection Time: 10/28/23  2:26 PM  Result Value Ref Range   Sodium 139 135 - 145 mmol/L   Potassium 4.0 3.5 - 5.1 mmol/L   Chloride 103 98 - 111 mmol/L   CO2 21 (L) 22 - 32 mmol/L   Glucose, Bld 102 (H) 70 - 99 mg/dL     Comment: Glucose reference range applies only to samples taken after fasting for at least 8 hours.   BUN 11 6 - 20 mg/dL   Creatinine 9.28 9.55 - 1.00 mg/dL   Calcium  10.1 8.9 - 10.3 mg/dL   Total Protein 7.9 6.5 - 8.1 g/dL   Albumin 4.3 3.5 - 5.0 g/dL   AST 19 15 - 41 U/L   ALT 14 0 - 44 U/L   Alkaline Phosphatase 86 38 - 126 U/L   Total Bilirubin 0.8 0.0 - 1.2 mg/dL   GFR, Estimated >39 >39 mL/min    Comment: (NOTE) Calculated using the CKD-EPI Creatinine Equation (2021)    Anion gap 14 5 - 15    Comment: Performed at [facility], [address], [place], [place] [address]  CBC with Differential ([facility] Only)     Status: Abnormal   Collection Time: 10/28/23  2:26 PM  Result Value Ref Range   WBC Count 13.1 (H) 4.0 - 10.5 K/uL   RBC 4.91 3.87 - 5.11 MIL/uL   Hemoglobin 15.1 (H) 12.0 - 15.0 g/dL   HCT 55.7 63.9 - 53.9 %   MCV 90.0 80.0 - 100.0 fL   MCH 30.8 26.0 - 34.0 pg   MCHC 34.2 30.0 - 36.0 g/dL   RDW 87.3 88.4 - 84.4 %   Platelet Count 262 150 - 400 K/uL   nRBC 0.0 0.0 - 0.2 %   Neutrophils Relative % 57 %   Neutro Abs 7.5 1.7 - 7.7 K/uL   Lymphocytes Relative 35 %   Lymphs Abs 4.5 (H) 0.7 - 4.0 K/uL   Monocytes Relative 6 %   Monocytes Absolute 0.7 0.1 - 1.0 K/uL   Eosinophils Relative 2 %   Eosinophils Absolute 0.2 0.0 - 0.5 K/uL   Basophils Relative 0 %   Basophils Absolute 0.1 0.0 - 0.1 K/uL   Immature Granulocytes 0 %   Abs Immature Granulocytes 0.04 0.00 -  0.07 K/uL    Comment: Performed at [facility], [address] [place], [place] [address]  Multiple Myeloma Panel (SPEP&IFE w/QIG)     Status: Abnormal   Collection Time: 10/28/23  2:27 PM  Result Value Ref Range   IgG (Immunoglobin G), Serum 1,213 586 - 1,602 mg/dL   IgA 652 87 - 647 mg/dL   IgM (Immunoglobulin M), Srm 111 26 - 217 mg/dL   Total Protein ELP 7.2 6.0 - 8.5 g/dL   Albumin SerPl Elph-Mcnc 3.8 2.9 - 4.4 g/dL   Alpha 1 0.2 0.0 - 0.4 g/dL    Alpha2 Glob SerPl Elph-Mcnc 0.8 0.4 - 1.0 g/dL   B-Globulin SerPl Elph-Mcnc 1.3 0.7 - 1.3 g/dL   Gamma Glob SerPl Elph-Mcnc 1.0 0.4 - 1.8 g/dL   M Protein SerPl Elph-Mcnc 0.3 (H) Not Observed g/dL   Globulin, Total 3.4 2.2 - 3.9 g/dL   Albumin/Glob SerPl 1.2 0.7 - 1.7   IFE 1 Comment (A)     Comment: (NOTE) Immunofixation shows IgG monoclonal protein with lambda light chain specificity.    Please Note Comment     Comment: (NOTE) Protein electrophoresis scan will follow via computer, mail, or courier delivery. Performed At: [facility] [address] [place], [place] [id number] Jennette Shorter MD Ey:[id number]   Terril Cera Plus     Status: None   Collection Time: 11/05/23 10:21 AM  Result Value Ref Range   QuantiFERON Incubation Incubation performed.    QuantiFERON Criteria Comment     Comment: QuantiFERON-TB Gold Plus is a qualitative indirect test for M tuberculosis infection (including disease) and is intended for use in conjunction with risk assessment, radiography, and other medical and diagnostic evaluations. The QuantiFERON-TB Gold Plus result is determined by subtracting the Nil value from either TB antigen (Ag) value. The Mitogen tube serves as a control for the test.    QuantiFERON TB1 Ag Value 0.08 IU/mL   QuantiFERON TB2 Ag Value 0.06 IU/mL   QuantiFERON Nil Value 0.04 IU/mL   QuantiFERON Mitogen Value >10.00 IU/mL   QuantiFERON-TB Gold Plus Negative Negative    Comment: No response to M tuberculosis antigens detected. Infection with M tuberculosis is unlikely, but high risk individuals should be considered for additional testing (ATS/IDSA/CDC Clinical Practice Guidelines, 2017). The reference range is an Antigen minus Nil result of <0.35 IU/mL. Chemiluminescence immunoassay methodology   Hepatitis B surface antibody,quantitative     Status: Abnormal   Collection Time: 11/05/23 10:21 AM  Result Value Ref Range   Hepatitis B Surf Ab Quant <3.5  (L) Immunity>10 mIU/mL    Comment:   Status of Immunity                     Anti-HBs Level   ------------------                     -------------- Inconsistent with Immunity                  0.0 - 10.0 Consistent with Immunity                         >10.0   Measles/Mumps/Rubella Immunity     Status: None   Collection Time: 11/05/23 10:21 AM  Result Value Ref Range   Rubella Antibodies, IGG 22.70 Immune >0.99 index    Comment:  Non-immune       <0.90                                 Equivocal  0.90 - 0.99                                 Immune           >0.99    RUBEOLA AB, IGG >300.0 Immune >16.4 AU/mL    Comment:                                  Negative        <13.5                                  Equivocal 13.5 - 16.4                                  Positive        >16.4 Presence of antibodies to Rubeola is presumptive evidence of immunity except when acute infection is suspected.    MUMPS ABS, IGG 111.0 Immune >10.9 AU/mL    Comment:                                 Negative         <9.0                                 Equivocal  9.0 - 10.9                                 Positive        >10.9 A positive result generally indicates past exposure to Mumps virus or previous vaccination.   Varicella zoster antibody, IgG     Status: None   Collection Time: 11/05/23 10:21 AM  Result Value Ref Range   Varicella zoster IgG Reactive Non Reactive    Comment: **Please note reference interval change** A Reactive result is considered evidence of immunity to VZV. Reactive indicates that VZV IgG was detected consistent with previous infection and/or vaccination. A Non Reactive result indicates that VZV IgG was not detected suggesting that immunity has not been acquired.   Basic Metabolic Panel     Status: Abnormal   Collection Time: 11/27/23 10:55 AM  Result Value Ref Range   Glucose 101 (H) 70 - 99 mg/dL   BUN 13 8 - 27 mg/dL   Creatinine, Ser 9.28  0.57 - 1.00 mg/dL   eGFR 97 >40 fO/fpw/8.26   BUN/Creatinine Ratio 18 12 - 28   Sodium 138 134 - 144 mmol/L   Potassium 4.5 3.5 - 5.2 mmol/L   Chloride 100 96 - 106 mmol/L   CO2 22 20 - 29 mmol/L   Calcium  9.8 8.7 - 10.3 mg/dL  Surgical pathology     Status: None   Collection Time: 12/02/23 12:00 AM  Result Value Ref Range   SURGICAL PATHOLOGY  Surgical Pathology CASE: [id number] PATIENT: Lielle Selvey Flow Pathology Report     Clinical history: Monoclonal gammopathy     DIAGNOSIS:  - No abnormal B or T-cell population identified  GATING AND PHENOTYPIC ANALYSIS:  Gated population: Flow cytometric immunophenotyping is performed using antibodies to the antigens listed in the table below. Electronic gates are placed around a cell cluster displaying light scatter properties corresponding to: lymphocytes  Abnormal Cells in gated population: N/A  Phenotype of Abnormal Cells: N/A                       Lymphoid Antigens       Myeloid Antigens Miscellaneous CD2  tested    CD10 tested    CD11b     ND   CD45 tested CD3  tested    CD19 tested    CD11c     ND   HLA-Dr    ND CD4  tested    CD20 tested    CD13 ND   CD34 tested CD5  tested    CD22 ND   CD14 ND   CD38 tested CD7  tested    CD79b     ND   CD15 ND   CD138     ND CD8  tested    CD103     ND   CD16 ND   TdT  ND CD25 ND   CD200     tested     CD33 ND   CD123     ND TCRab     ND   sKappa    tested    CD64 ND   CD41 ND TCRgd     tested    sLambda   tested    CD117     ND   CD61 ND CD56 tested    cKappa    ND   MPO  ND   CD71 ND CD57 ND   cLambda   ND        CD235aND       GROSS DESCRIPTION:   One lavender top tup submitted from [facility] for lymphoma.   Final Diagnosis performed by Ilsa Pottier, MD.   Electronically signed 12/03/2023 Technical and / or Professional components performed at [facility], [address], [place], [place] [address].  The above tests were  developed and their performance characteristics determined by the [facility] system for the physical and immunophenotypic characterization of cell populations. They have not been cleared by the U.S. Food and Drug administration. The  FDA has determined that such clearance or approval is not necessary. This test is used for clinical purposes. It should not be  regarded as investigational or for research   Lactate dehydrogenase     Status: None   Collection Time: 12/02/23 10:05 AM  Result Value Ref Range   LDH 180 98 - 192 U/L    Comment: Performed at [facility], [address], [place], [place] [address]  Kappa/lambda light chains     Status: None   Collection Time: 12/02/23 10:05 AM  Result Value Ref Range   Kappa free light chain 17.6 3.3 - 19.4 mg/L   Lambda free light chains 17.2 5.7 - 26.3 mg/L   Kappa, lambda light chain ratio 1.02 0.26 - 1.65    Comment: (NOTE) Performed At: [facility] [address] [place], [place] [id number] Jennette Shorter MD Ey:[id number]   Multiple Myeloma Panel (SPEP&IFE w/QIG)     Status:  Abnormal   Collection Time: 12/02/23 10:05 AM  Result Value Ref Range   IgG (Immunoglobin G), Serum 1,168 586 - 1,602 mg/dL   IgA 674 87 - 647 mg/dL   IgM (Immunoglobulin M), Srm 112 26 - 217 mg/dL   Total Protein ELP 7.0 6.0 - 8.5 g/dL   Albumin SerPl Elph-Mcnc 3.5 2.9 - 4.4 g/dL   Alpha 1 0.2 0.0 - 0.4 g/dL   Alpha2 Glob SerPl Elph-Mcnc 0.8 0.4 - 1.0 g/dL   B-Globulin SerPl Elph-Mcnc 1.3 0.7 - 1.3 g/dL   Gamma Glob SerPl Elph-Mcnc 1.1 0.4 - 1.8 g/dL   M Protein SerPl Elph-Mcnc 0.3 (H) Not Observed g/dL   Globulin, Total 3.5 2.2 - 3.9 g/dL   Albumin/Glob SerPl 1.1 0.7 - 1.7   IFE 1 Comment (A)     Comment: (NOTE) Immunofixation shows IgG monoclonal protein with lambda light chain specificity.    Please Note Comment     Comment: (NOTE) Protein electrophoresis scan will follow via computer, mail, or courier  delivery. Performed At: [facility] [address] [place], [place] [id number] Jennette Shorter MD Ey:[id number]   Flow Cytometry, Peripheral Blood (Oncology)     Status: None   Collection Time: 12/02/23 10:05 AM  Result Value Ref Range   Flow Cytometry SEE SEPARATE REPORT     Comment: Performed at [facility], [address] [place], [place] [address]  CMP ([facility] only)     Status: None   Collection Time: 12/02/23 10:05 AM  Result Value Ref Range   Sodium 137 135 - 145 mmol/L   Potassium 4.1 3.5 - 5.1 mmol/L   Chloride 101 98 - 111 mmol/L   CO2 23 22 - 32 mmol/L   Glucose, Bld 97 70 - 99 mg/dL    Comment: Glucose reference range applies only to samples taken after fasting for at least 8 hours.   BUN 11 6 - 20 mg/dL   Creatinine 9.28 9.55 - 1.00 mg/dL   Calcium  9.7 8.9 - 10.3 mg/dL   Total Protein 7.6 6.5 - 8.1 g/dL   Albumin 4.2 3.5 - 5.0 g/dL   AST 21 15 - 41 U/L   ALT 16 0 - 44 U/L   Alkaline Phosphatase 75 38 - 126 U/L   Total Bilirubin 0.7 0.0 - 1.2 mg/dL   GFR, Estimated >39 >39 mL/min    Comment: (NOTE) Calculated using the CKD-EPI Creatinine Equation (2021)    Anion gap 13 5 - 15    Comment: Performed at [facility], [address] [place], [place] [address]  CBC with Differential ([facility] Only)     Status: None   Collection Time: 12/02/23 10:05 AM  Result Value Ref Range   WBC Count 8.9 4.0 - 10.5 K/uL   RBC 4.74 3.87 - 5.11 MIL/uL   Hemoglobin 14.9 12.0 - 15.0 g/dL   HCT 57.1 63.9 - 53.9 %   MCV 90.3 80.0 - 100.0 fL   MCH 31.4 26.0 - 34.0 pg   MCHC 34.8 30.0 - 36.0 g/dL   RDW 87.5 88.4 - 84.4 %   Platelet Count 235 150 - 400 K/uL   nRBC 0.0 0.0 - 0.2 %   Neutrophils Relative % 48 %   Neutro Abs 4.3 1.7 - 7.7 K/uL   Lymphocytes Relative 42 %   Lymphs Abs 3.8 0.7 - 4.0 K/uL   Monocytes Relative 6 %   Monocytes Absolute 0.6 0.1 - 1.0 K/uL  Eosinophils Relative 2 %   Eosinophils Absolute 0.2  0.0 - 0.5 K/uL   Basophils Relative 1 %   Basophils Absolute 0.1 0.0 - 0.1 K/uL   Immature Granulocytes 1 %   Abs Immature Granulocytes 0.05 0.00 - 0.07 K/uL    Comment: Performed at [facility], [address] [place], [place] [address]     RADIOGRAPHIC STUDIES:  No recent pertinent imaging studies available to review.  No orders of the defined types were placed in this encounter.    Future Appointments  Date Time Provider Department [facility]  12/19/2023 10:00 AM [facility] NURSE [facility]  03/17/2024  3:00 PM [facility] PHLEBOTOMIST [facility] None  03/31/2024  3:00 PM Selyna Klahn, Chinita, MD [facility] None    This document was completed utilizing speech recognition software. Grammatical errors, random word insertions, pronoun errors, and incomplete sentences are an occasional consequence of this system due to software limitations, ambient noise, and hardware issues. Any formal questions or concerns about the content, text or information contained within the body of this dictation should be directly addressed to the provider for clarification.

## 2023-12-19 ENCOUNTER — Ambulatory Visit (INDEPENDENT_AMBULATORY_CARE_PROVIDER_SITE_OTHER)

## 2023-12-19 VITALS — BP 118/74 | HR 86

## 2023-12-19 DIAGNOSIS — Z7185 Encounter for immunization safety counseling: Secondary | ICD-10-CM

## 2023-12-19 NOTE — Progress Notes (Signed)
 Patient presents to clinic for second Hep B vaccination.   Upon arrival to room, patient reports concerns with intermittent shortness of breath and fatigue that has been going on for about two weeks. She also reports nausea this morning after eating a cinnamon roll.   She denies chest pain. She is currently well appearing, no acute distress noted. Speaking in complete sentences. See below vitals.   Today's Vitals   12/19/23 1030  BP: 118/74  Pulse: 86  SpO2: 99%    Due to patient not feeling well, held Hep B vaccine. Patient scheduled follow up with Dr. Romelle next week. Patient requesting to only see Dr. Romelle. Strict ED precautions discussed.  Upon review of provider's schedule, provider has ATC clinic tomorrow afternoon. Spoke with Dr. Delores who recommended ED evaluation to rule out cardiac concern. Patient called and scheduled in ATC clinic tomorrow afternoon. I reiterated ED recommendation. Patient voices understanding.   Patient will keep scheduled follow up for tomorrow.   Chiquita JAYSON English, RN

## 2023-12-20 ENCOUNTER — Other Ambulatory Visit: Payer: Self-pay

## 2023-12-20 ENCOUNTER — Ambulatory Visit

## 2023-12-20 VITALS — BP 122/81 | HR 76 | Temp 97.9°F | Ht 63.0 in | Wt 225.6 lb

## 2023-12-20 DIAGNOSIS — R5383 Other fatigue: Secondary | ICD-10-CM | POA: Diagnosis not present

## 2023-12-20 DIAGNOSIS — E785 Hyperlipidemia, unspecified: Secondary | ICD-10-CM | POA: Diagnosis not present

## 2023-12-20 DIAGNOSIS — R0602 Shortness of breath: Secondary | ICD-10-CM

## 2023-12-20 DIAGNOSIS — G629 Polyneuropathy, unspecified: Secondary | ICD-10-CM | POA: Diagnosis not present

## 2023-12-20 DIAGNOSIS — D472 Monoclonal gammopathy: Secondary | ICD-10-CM | POA: Diagnosis not present

## 2023-12-20 MED ORDER — DULOXETINE HCL 60 MG PO CPEP: 60.0000 mg | ORAL_CAPSULE | Freq: Every day | ORAL | 3 refills | Status: DC

## 2023-12-20 MED ORDER — ROSUVASTATIN CALCIUM 5 MG PO TABS
5.0000 mg | ORAL_TABLET | Freq: Every day | ORAL | 3 refills | Status: AC
Start: 2023-12-20 — End: ?

## 2023-12-20 NOTE — Assessment & Plan Note (Addendum)
 Suspect related to her underlying mood disorder/depression, fibromyalgia, and possibly some vasomotor/postmenopausal symptoms contributing. Will check TSH, B12, folate, vitamin D per patient preference as these has not not been checked recently and her symptoms have been slightly worsening over the past couple of months.  Start Cymbalta  60 mg daily Follow-up in 2 weeks given documented history of suicidal ideation while on Cymbalta  however patient prefers to restart this and does not feel like it was the medication causing this  She was seen by neurology in 2017 for similar symptoms, at that time symptoms were also felt to be more so related to the above

## 2023-12-20 NOTE — Assessment & Plan Note (Signed)
Crestor 5 mg daily

## 2023-12-20 NOTE — Progress Notes (Signed)
    SUBJECTIVE:   CHIEF COMPLAINT / HPI:   Patient initially made her appointment due to concern of intermittent shortness of breath Upon further questioning she feels that this is more so related to generalized fatigue and her chronic polyneuropathy, both of which have been going on for several months now.  She tried taking low-dose gabapentin  but she did not like the way this made her feel so she stopped taking it.  Denies any current shortness of breath, recent chest pain, fevers, cough, illness, vomiting, diaphoresis  She is interested in trying Cymbalta  again as this was tried a few years ago and had some benefit for her.  There is a documented allergy in her chart of suicidal ideation while on Cymbalta  however she reports that she does not think this was actually the drug and rather other environmental things happening in her life at the time.  She is willing to restart it  She is also interested in restarting her Crestor .  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  She reports that she did well on the 5 mg dose.  Has been following w hemeonc recently for MGUS, unlikely to be causing symptoms per their note  She is due for colonoscopy, wants to wait another year  PERTINENT  PMH / PSH: Mild CAD, MGUS  OBJECTIVE:   BP 122/81   Pulse 76   Temp 97.9 F (36.6 C)   Ht 5' 3 (1.6 m)   Wt 225 lb 9.6 oz (102.3 kg)   LMP 04/23/2009   SpO2 91%   BMI 39.96 kg/m   General: NAD, pleasant, able to participate in exam Cardiac: RRR, no murmurs auscultated Respiratory: CTAB, normal WOB Abdomen: soft, non-tender, non-distended, normoactive bowel sounds Extremities: warm and well perfused, no edema or cyanosis Skin: warm and dry, no rashes noted Neuro: alert, no obvious focal deficits, speech normal Psych: Normal affect and mood  ASSESSMENT/PLAN:   Assessment & Plan Other fatigue SOB (shortness of  breath) Polyneuropathy Suspect related to her underlying mood disorder/depression, fibromyalgia, and possibly some vasomotor/postmenopausal symptoms contributing. Will check TSH, B12, folate, vitamin D per patient preference as these has not not been checked recently and her symptoms have been slightly worsening over the past couple of months.  Start Cymbalta  60 mg daily Follow-up in 2 weeks given documented history of suicidal ideation while on Cymbalta  however patient prefers to restart this and does not feel like it was the medication causing this  She was seen by neurology in 2017 for similar symptoms, at that time symptoms were also felt to be more so related to the above Hyperlipidemia, unspecified hyperlipidemia type Crestor  5mg  daily   Payton Coward, MD [facility] [place]

## 2023-12-20 NOTE — Patient Instructions (Signed)
 Start taking Cymbalta  daily.  Lets follow-up in about 2 weeks to make sure you are tolerating this okay.  It may take about 4 to 6 weeks for this to take effect  Start taking Crestor  5 mg daily.  Let me know if you have any reaction to this  Come back on Tuesday to get your blood drawn.If any of your results are abnormal and/or require changes to your medical care, I will give you a call. Otherwise, I will send you a letter in the mail or a message on MyChart.

## 2023-12-24 ENCOUNTER — Other Ambulatory Visit

## 2023-12-26 ENCOUNTER — Encounter: Payer: Self-pay | Admitting: Family Medicine

## 2023-12-26 ENCOUNTER — Ambulatory Visit (INDEPENDENT_AMBULATORY_CARE_PROVIDER_SITE_OTHER): Admitting: Family Medicine

## 2023-12-26 VITALS — BP 122/82 | HR 66 | Ht 63.0 in | Wt 222.0 lb

## 2023-12-26 DIAGNOSIS — G629 Polyneuropathy, unspecified: Secondary | ICD-10-CM | POA: Diagnosis not present

## 2023-12-26 DIAGNOSIS — R0602 Shortness of breath: Secondary | ICD-10-CM

## 2023-12-26 DIAGNOSIS — R5383 Other fatigue: Secondary | ICD-10-CM

## 2023-12-26 DIAGNOSIS — Z23 Encounter for immunization: Secondary | ICD-10-CM

## 2023-12-26 NOTE — Progress Notes (Signed)
    SUBJECTIVE:   CHIEF COMPLAINT / HPI:   Fatigue Was started on duloxetine  last week, reports this has been making her feel more drowsy, nauseous, and decreased appetite. Did not take last night. However she did notice it helped with her pain. Does not want to take it anymore  Wasn't able to get labs checked earlier this week - was planning to check TSH, B12, folate, vit D    STOP-BANG for sleep apnea: -Snores loudly: yes -Tired, fatigued, sleepy during daytime: yes -Observed to have apneic episodes during sleep: unsure -High blood pressure: history of htn  BMI >35: yes Age >50: yes Female gender: no Neck circumference >40: unsure  Score (1 point for each positive item): 5-6   PERTINENT  PMH / PSH: HTN, CAD, MGUS  OBJECTIVE:   BP 122/82   Pulse 66   Ht 5' 3 (1.6 m)   Wt 222 lb (100.7 kg)   LMP 04/23/2009   SpO2 98%   BMI 39.33 kg/m   General: NAD, pleasant, able to participate in exam Respiratory: No respiratory distress Skin: warm and dry, no rashes noted Psych: Normal affect and mood  ASSESSMENT/PLAN:   Assessment & Plan Other fatigue TSH, B12, folate, vitD Cymbalta  discontinued per patient preference. Consider [facility] referral placed for sleep study, OSA eval given elevated stop bang score   Payton Coward, MD [facility]

## 2023-12-26 NOTE — Patient Instructions (Addendum)
 If any of your results from today are abnormal and/or require changes to your medical care, I will give you a call. Otherwise, I will send you a letter in the mail or a message on MyChart.   You should get a call to schedule with sleep medicine soon

## 2023-12-26 NOTE — Assessment & Plan Note (Addendum)
 TSH, B12, folate, vitD Cymbalta  discontinued per patient preference. Consider [facility] referral placed for sleep study, OSA eval given elevated stop bang score

## 2023-12-27 LAB — VITAMIN B12: Vitamin B-12: 608 pg/mL (ref 232–1245)

## 2023-12-27 LAB — VITAMIN D 25 HYDROXY (VIT D DEFICIENCY, FRACTURES): Vit D, 25-Hydroxy: 50.4 ng/mL (ref 30.0–100.0)

## 2023-12-27 LAB — FOLATE: Folate: >20 ng/mL (ref >3.0)

## 2023-12-28 LAB — TSH RFX ON ABNORMAL TO FREE T4: TSH: 3.85 u[IU]/mL (ref 0.450–4.500)

## 2023-12-29 ENCOUNTER — Ambulatory Visit: Payer: Self-pay | Admitting: Family Medicine

## 2024-01-03 DIAGNOSIS — Z419 Encounter for procedure for purposes other than remedying health state, unspecified: Secondary | ICD-10-CM | POA: Diagnosis not present

## 2024-01-04 NOTE — Progress Notes (Addendum)
 Cardiology Office Note:   Date:  01/06/2024  ID:  Donna [facility], DOB Dec 03, 1965, MRN [id number] PCP:  Donna Booty, MD  [facility] Providers Cardiologist:  [name], MD Referring MD: Donna Booty, MD  Chief Complaint/Reason for Referral: Cardiology follow-up ASSESSMENT:    1. Mild CAD   2. Hyperlipidemia LDL goal <70   3. Elevated lipoprotein(a)   4. BMI 36.0-36.9,adult   5. Prediabetes   6. Palpitations      PLAN:   In order of problems listed above: Coronary artery disease: Mild on coronary CTA: Continue Crestor  5 mg and aspirin  81 mg.   Hyperlipidemia: Continue rosuvastatin  5 mg and start Zetia  10 mg; check lipid panel and LFTs in 2 months.  Given elevated LP(a) will target LDL goal less than 55. Elevated LP(a): See discussion above. Elevated BMI: Diet and exercise.  If she is diagnosed with diabetes consider GLP-1 receptor agonist therapy. Prediabetes: Per PCP. Palpitations: Reassuring monitor.  Palpitations are somewhat rare and associated with severe symptoms.             Dispo:  Return in about 6 months (around 07/05/2024).      Medication Adjustments/Labs and Tests Ordered: Current medicines are reviewed at length with the patient today.  Concerns regarding medicines are outlined above.  The following changes have been made:     Labs/tests ordered: Orders Placed This Encounter  Procedures   Lipid panel   Hepatic function panel    Medication Changes: Meds ordered this encounter  Medications   ezetimibe  (ZETIA ) 10 MG tablet    Sig: Take 1 tablet (10 mg total) by mouth daily.    Dispense:  90 tablet    Refill:  3    Current medicines are reviewed at length with the patient today.  The patient does not have concerns regarding medicines.  I spent 33 minutes reviewing all clinical data during and prior to this visit including all relevant imaging studies, laboratories, clinical information from other health systems and prior notes from both  Cardiology and other specialties, interviewing the patient, conducting a complete physical examination, and coordinating care in order to formulate a comprehensive and personalized evaluation and treatment plan.   History of Present Illness:      FOCUSED PROBLEM LIST:   Coronary artery disease Minimal coronary CTA 2024 Hyperlipidemia Intolerant of atorvastatin  Can tolerate rosuvastatin  5 mg only LP(a) 312 Palpitations Rare PACs, PVCs and, no AF monitor 2025 Prediabetes Hemoglobin A1c 5.7 2025 Graves' disease BMI 36 MGUS  2/24: The patient is a 58 y.o. female with the indicated medical history here for recommendations regarding chest pain and dyspnea.  The patient was seen by her PCP recently with these complaints.  An EKG, troponins and other laboratories were reassuring; her D-Dimer was elevated and a LE doppler was negative.   The patient is here today and reports chest discomfort, chest fullness on recumbency, and shortness of breath.  This happens with exertion and resolves with rest.  She denies any symptoms at rest.  She denies any bleeding or bruising.  She does snore and because of problems with sleep hygiene is right now reluctant to get a sleep apnea evaluation.  She did have some peripheral edema recently but it seems to be better now that she has been off her feet for some time.  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  She fortunately has not required any emergency room visits or hospitalizations.   She  does not smoke.  Plan:  Start Imdur  30, Toprol  12.5, and ASA; obtain coronary CTA and TTE; consider Jardiance in the future; refer to pharmacy for lipid and weight management.  Consider empiric Lasix  for shortness of breath depending on testing.   1/25: In the interim the patient had a coronary CTA which showed minimal obstructive coronary artery disease and an echocardiogram with preserved LV function with no significant valvular abnormalities.  She was  seen by pharmacy and she decided to retrial Crestor .  She was seen by her PCP and was not taking Imdur , metoprolol , or aspirin .  She was tolerating Crestor  5 mg however.  The patient is doing fairly well.  She occasionally gets short of breath when she goes upstairs.  She does not exercise on a regular basis and thinks that might be the issue.  She typically not short of breath most days.  She did denies any exertional angina.  Of note she is not noticed a racing heartbeat particularly when she lays down.  She will feel a forceful thumping and sometimes a fast heartbeat.  This last perhaps several minutes.  She also notices when she lays directly on her back she will feel a chest fullness.  She has to lay on her side to make this better.  She denies any peripheral edema, presyncope, or syncope.  She is tolerating low-dose Crestor  well.  Plan: Start aspirin  81 mg, check reflex TSH and monitor due to palpitations, trial Lasix  due to orthopnea.  September 2025:  Patient consents to use of AI scribe. Patient's LDL was not at goal.  Her Crestor  was increased to 10 mg.  Her monitor was reassuring.  She was seen by oncology and diagnosed with MGUS.  She developed joint pains which may have been response to rosuvastatin  10 mg; her rosuvastatin  was decreased to 5 mg.  She was previously on Crestor  10mg , which she had to discontinue due to intolerance. She has recently resumed taking Crestor  5mg  daily at night for about a week. She experiences occasional chest flutter and mild chest pain, but nothing severe. Her cholesterol levels were last checked in January and were still high.  She experiences lightheadedness and dizziness, particularly when hungry, which has been a concern even before starting her current medications. She experiences headaches and fatigue after eating, and her heart races at times. She has been evaluated for hypoglycemia, but her sugar levels were reported as normal. She experiences a daily  headache around the same time each day.  She reports sleep disturbances, including snoring and episodes where she wakes up suddenly feeling like her air stops. She gets up three to four times a night to use the bathroom.  She experiences tingling and numbness in her hands and arms, along with swelling in her feet. She has a hemoglobin A1c of 5.7. She wants to resume walking to improve her health and manage her weight, which she gains easily despite not eating three meals a day.           Current Medications: Current Meds  Medication Sig   acetaminophen  (TYLENOL ) 500 MG tablet Take 1,000 mg by mouth every 6 (six) hours as needed for moderate pain (pain score 4-6).   albuterol  (PROAIR  HFA) 108 (90 Base) MCG/ACT inhaler Inhale 2 puffs into the lungs every 6 (six) hours as needed for wheezing or shortness of breath.   aspirin  EC 81 MG tablet Take 1 tablet (81 mg total) by mouth daily. Swallow whole.   diclofenac  (VOLTAREN )  50 MG EC tablet TAKE 1 TABLET (50 MG TOTAL) BY MOUTH 2 (TWO) TIMES DAILY WITH A MEAL.   ezetimibe  (ZETIA ) 10 MG tablet Take 1 tablet (10 mg total) by mouth daily.   furosemide  (LASIX ) 20 MG tablet Take 1 tablet (20 mg total) by mouth daily.   gabapentin  (NEURONTIN ) 100 MG capsule Take 1 capsule (100 mg total) by mouth 3 (three) times daily.   ibuprofen  (ADVIL ) 200 MG tablet Take 200 mg by mouth every 6 (six) hours as needed.   Multiple Vitamins-Minerals (MULTIVITAMIN WITH MINERALS) tablet Take 1 tablet by mouth daily.   rosuvastatin  (CRESTOR ) 5 MG tablet Take 1 tablet (5 mg total) by mouth daily.   valACYclovir (VALTREX) 500 MG tablet Take by mouth.     Review of Systems:   Please see the history of present illness.    All other systems reviewed and are negative.     EKGs/Labs/Other Test Reviewed:   EKG: 2025 normal sinus rhythm  EKG Interpretation Date/Time:    Ventricular Rate:    PR Interval:    QRS Duration:    QT Interval:    QTC Calculation:   R Axis:       Text Interpretation:           Risk Assessment/Calculations:          Physical Exam:   VS:  BP 128/76   Pulse 95   Ht 5' 3 (1.6 m)   Wt 222 lb (100.7 kg)   LMP 04/23/2009   SpO2 98%   BMI 39.33 kg/m        Wt Readings from Last 3 Encounters:  01/06/24 222 lb (100.7 kg)  12/26/23 222 lb (100.7 kg)  12/20/23 225 lb 9.6 oz (102.3 kg)      GENERAL:  No apparent distress, AOx3 HEENT:  No carotid bruits, +2 carotid impulses, no scleral icterus CAR: RRR no murmurs, gallops, rubs, or thrills RES:  Clear to auscultation bilaterally ABD:  Soft, nontender, nondistended, positive bowel sounds x 4 VASC:  +2 radial pulses, +2 carotid pulses NEURO:  CN 2-12 grossly intact; motor and sensory grossly intact PSYCH:  No active depression or anxiety EXT:  No edema, ecchymosis, or cyanosis  Signed, Angelee Bahr K Rajohn Henery, MD  01/06/2024 2:55 PM    [facility] [address] [place], [place], [place]  [address] Phone: [phone]; Fax: [phone]   Note:  This document was prepared using Dragon voice recognition software and may include unintentional dictation errors.

## 2024-01-06 ENCOUNTER — Encounter: Payer: Self-pay | Admitting: Internal Medicine

## 2024-01-06 ENCOUNTER — Ambulatory Visit: Attending: Internal Medicine | Admitting: Internal Medicine

## 2024-01-06 VITALS — BP 128/76 | HR 95 | Ht 63.0 in | Wt 222.0 lb

## 2024-01-06 DIAGNOSIS — E7841 Elevated Lipoprotein(a): Secondary | ICD-10-CM | POA: Diagnosis not present

## 2024-01-06 DIAGNOSIS — I2510 Atherosclerotic heart disease of native coronary artery without angina pectoris: Secondary | ICD-10-CM

## 2024-01-06 DIAGNOSIS — R7303 Prediabetes: Secondary | ICD-10-CM | POA: Diagnosis not present

## 2024-01-06 DIAGNOSIS — E785 Hyperlipidemia, unspecified: Secondary | ICD-10-CM | POA: Diagnosis not present

## 2024-01-06 DIAGNOSIS — Z6836 Body mass index (BMI) 36.0-36.9, adult: Secondary | ICD-10-CM

## 2024-01-06 DIAGNOSIS — R002 Palpitations: Secondary | ICD-10-CM | POA: Diagnosis not present

## 2024-01-06 MED ORDER — EZETIMIBE 10 MG PO TABS: 10.0000 mg | ORAL_TABLET | Freq: Every day | ORAL | 3 refills | Status: AC

## 2024-01-06 NOTE — Patient Instructions (Signed)
 Medication Instructions:  START Ezetimibe  (Zetia ) 10 mg once daily   *If you need a refill on your cardiac medications before your next appointment, please call your pharmacy*  Lab Work: To be completed in 2 months: lipid panel and LFT  If you have labs (blood work) drawn today and your tests are completely normal, you will receive your results only by: MyChart Message (if you have MyChart) OR A paper copy in the mail If you have any lab test that is abnormal or we need to change your treatment, we will call you to review the results.  Testing/Procedures: None ordered today.  Follow-Up: At [facility], you and your health needs are our priority.  As part of our continuing mission to provide you with exceptional heart care, our providers are all part of one team.  This team includes your primary Cardiologist (physician) and Advanced Practice Providers or APPs (Physician Assistants and Nurse Practitioners) who all work together to provide you with the care you need, when you need it.  Your next appointment:   6 month(s)  Provider:   One of our Advanced Practice Providers (APPs): Morse Clause, PA-C  Lamarr Satterfield, NP Miriam Shams, NP  Olivia Pavy, PA-C Josefa Beauvais, NP  Leontine Salen, PA-C Orren Fabry, PA-C  [place], PA-C Ernest Dick, NP  Damien Braver, NP Jon Hails, PA-C  Waddell Donath, PA-C    Dayna Dunn, PA-C  Scott Weaver, PA-C Lum Louis, NP Katlyn West, NP Callie Goodrich, PA-C  Xika Zhao, NP Sheng Haley, PA-C    Kathleen Johnson, PA-C

## 2024-02-02 DIAGNOSIS — Z419 Encounter for procedure for purposes other than remedying health state, unspecified: Secondary | ICD-10-CM | POA: Diagnosis not present

## 2024-02-06 IMAGING — MR MR MRA HEAD W/O CM
1 series · 22 of 48 positions shown · IV contrast (multihance)
Comparison: None.

CLINICAL DATA: Vision changes OW7.U (TBY-0D-CM). Nonintractable
headache, unspecified chronicity pattern, unspecified headache type
RPL.F (TBY-0D-CM). Tinnitus, unspecified laterality
(TBY-0D-CM).

EXAM:
MRA NECK WITHOUT AND WITH CONTRAST
MRA HEAD WITHOUT CONTRAST
TECHNIQUE: Multiplanar and multiecho pulse sequences of the neck were obtained
without and with intravenous contrast. Angiographic images of the
neck were obtained using MRA technique without and with intravenous
contast.; Angiographic images of the Circle of Willis were obtained
using MRA technique without intravenous contrast. 3D reconstructions
of the neck and head were obtained.
CONTRAST:  20mL MULTIHANCE GADOBENATE DIMEGLUMINE 529 MG/ML IV SOLN

[Series 4: tof_3d_multi-slab · axial · 0.7mm · 0.35mm/px · z∈[+139,+230]mm · 22 of 143 slices shown]
[im 1/143]
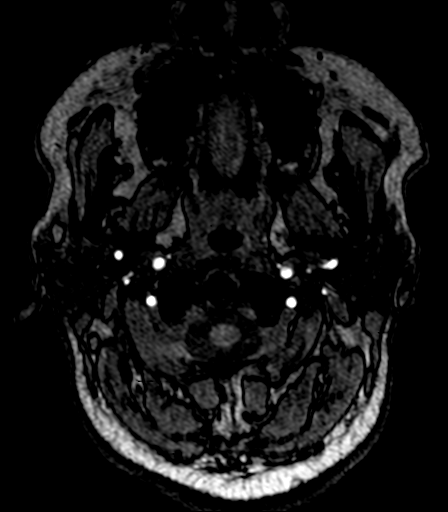
[im 4/143]
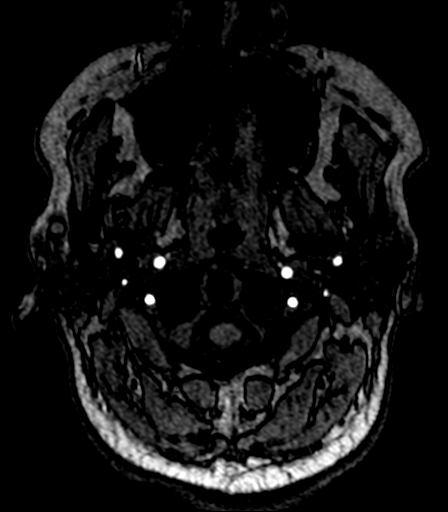
[im 7/143]
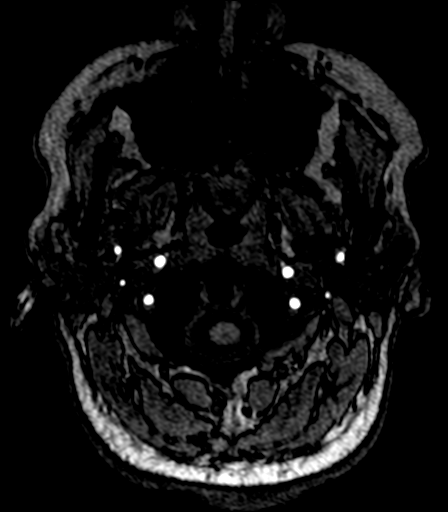
[im 10/143]
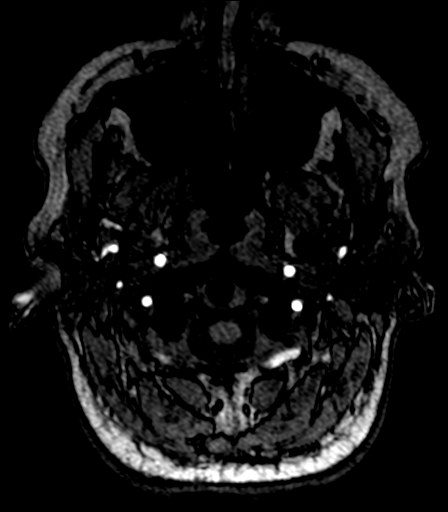
[im 13/143]
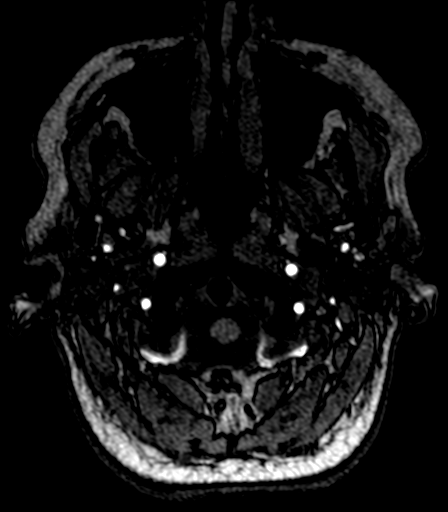
[im 16/143]
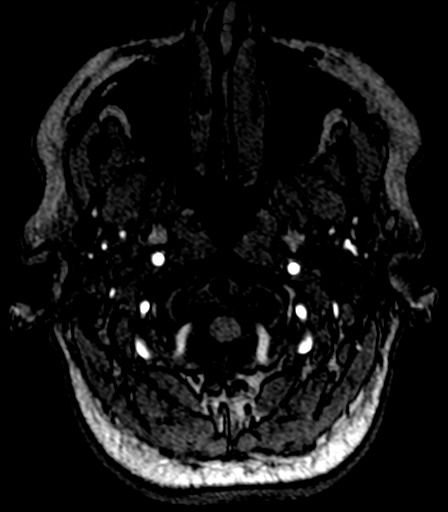
[im 19/143]
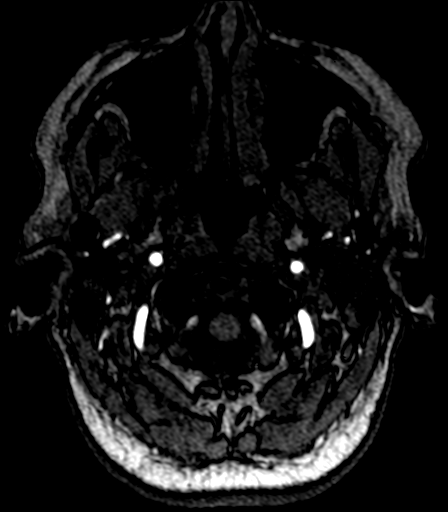
[im 22/143]
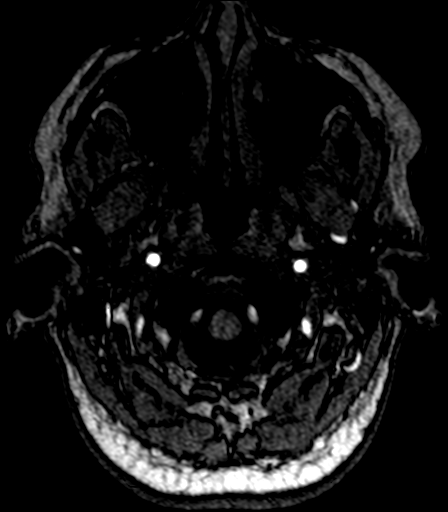
[im 25/143]
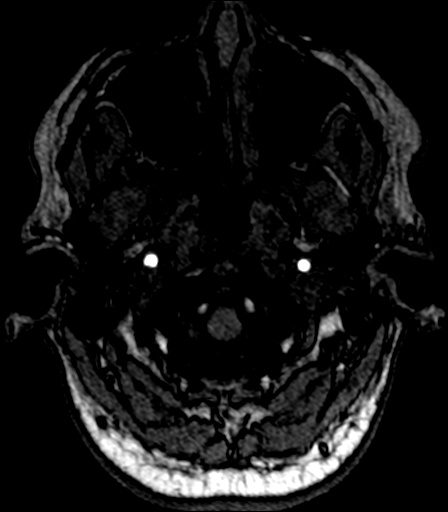
[im 28/143]
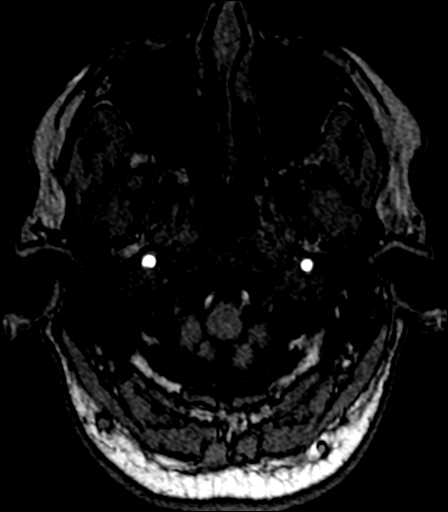
[im 31/143]
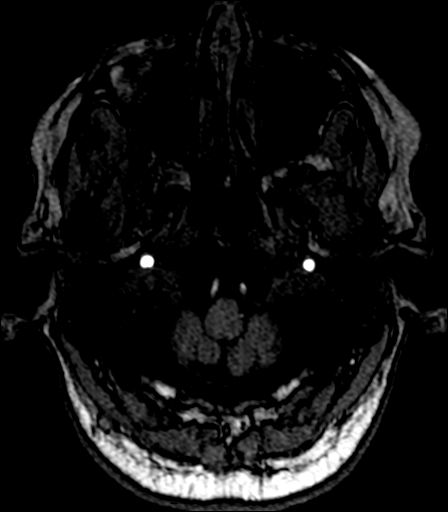
[im 34/143]
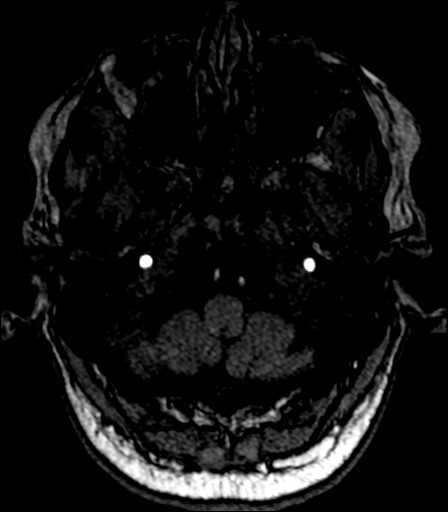
[im 37/143]
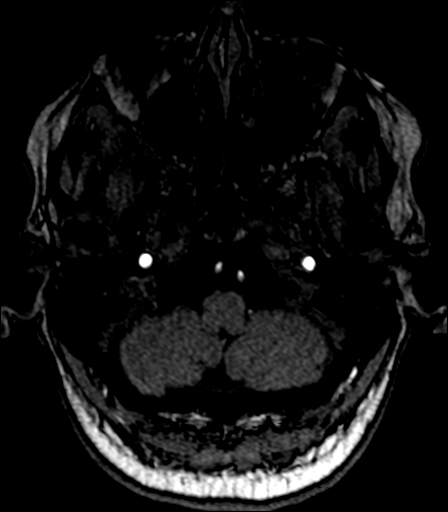
[im 40/143]
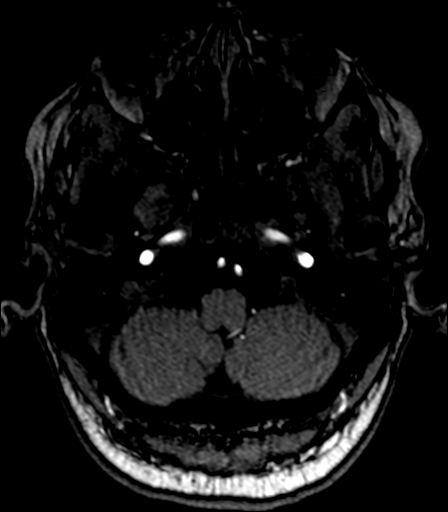
[im 46/143]
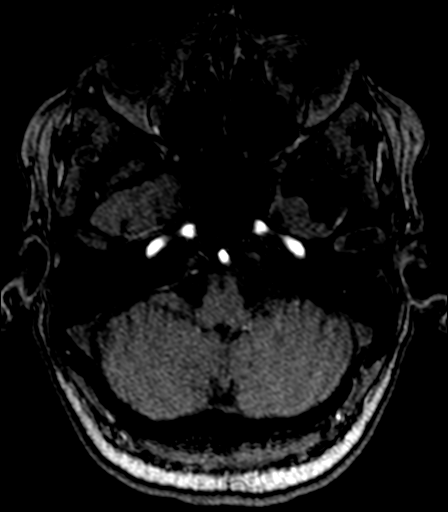
[im 64/143]
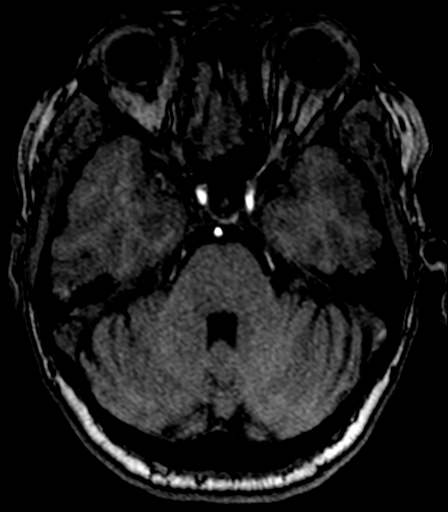
[im 73/143]
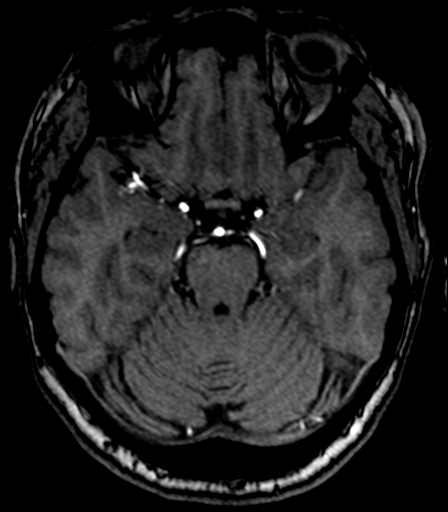
[im 82/143]
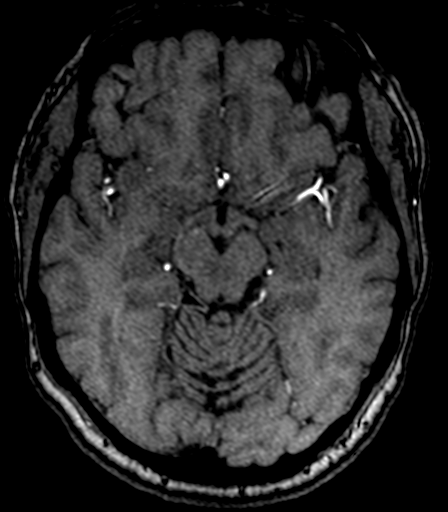
[im 100/143]
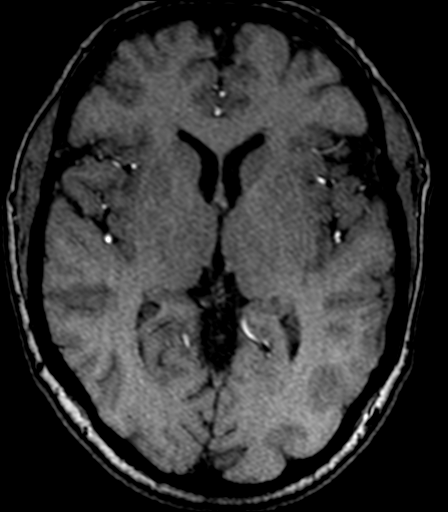
[im 118/143]
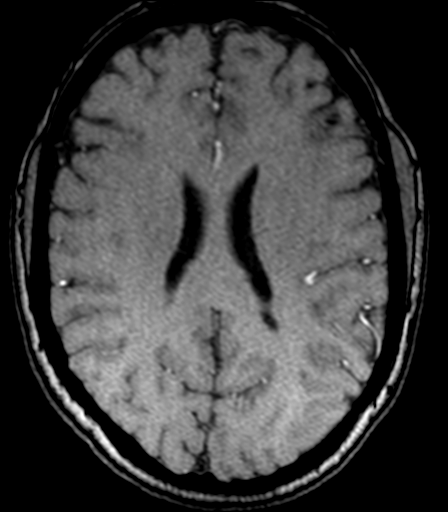
[im 121/143]
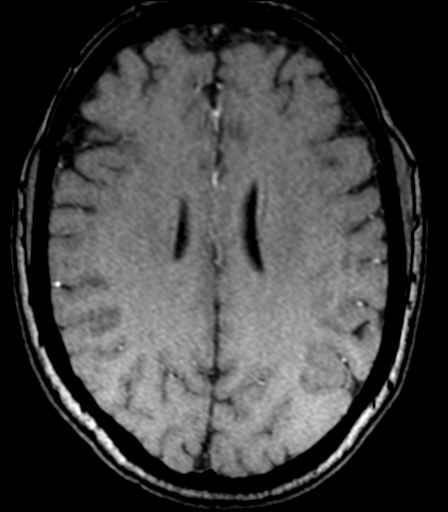
[im 136/143]
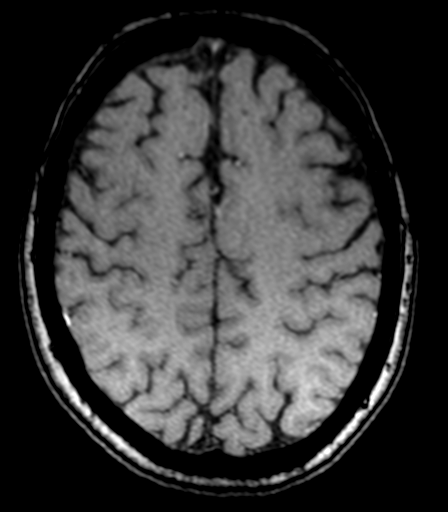

[22 of 48 positions shown; findings below may reference images not displayed]

FINDINGS: MRA NECK FINDINGS

Aortic arch: Normal 3 vessel aortic branching pattern. The
visualized subclavian arteries are normal.

Right carotid system: Normal course and caliber without stenosis or
evidence of dissection.

Left carotid system: Normal course and caliber without stenosis or
evidence of dissection.

Vertebral arteries: Codominant. Vertebral artery origins are normal.
Vertebral arteries are normal in caliber to the vertebrobasilar
confluence without stenosis or evidence of dissection. Increased
tortuosity at the V1 and V2 segments.

MRA HEAD FINDINGS

Anterior circulation: The visualized portions of the distal cervical
and intracranial internal carotid arteries are widely patent with
normal flow related enhancement. The bilateral anterior cerebral
arteries and middle cerebral arteries are widely patent with
antegrade flow without high-grade flow-limiting stenosis or proximal
branch occlusion. No intracranial aneurysm within the anterior
circulation.

The vertebral arteries are widely patent with antegrade flow.
Vertebrobasilar junction and basilar artery are widely patent with
antegrade flow without evidence of basilar stenosis or aneurysm.
Posterior cerebral arteries are normal bilaterally. No intracranial
aneurysm within the posterior circulation.
IMPRESSION: Unremarkable MR angiogram of the head and neck.

## 2024-02-06 IMAGING — MR MR MRA NECK WO/W CM
6 series · 35 of 48 positions shown · IV contrast (20 ML MULTIHANCE)
Comparison: None.

CLINICAL DATA: Vision changes OW7.U (TBY-0D-CM). Nonintractable
headache, unspecified chronicity pattern, unspecified headache type
RPL.F (TBY-0D-CM). Tinnitus, unspecified laterality
(TBY-0D-CM).

EXAM:
MRA NECK WITHOUT AND WITH CONTRAST
MRA HEAD WITHOUT CONTRAST
TECHNIQUE: Multiplanar and multiecho pulse sequences of the neck were obtained
without and with intravenous contrast. Angiographic images of the
neck were obtained using MRA technique without and with intravenous
contast.; Angiographic images of the Circle of Willis were obtained
using MRA technique without intravenous contrast. 3D reconstructions
of the neck and head were obtained.
CONTRAST:  20mL MULTIHANCE GADOBENATE DIMEGLUMINE 529 MG/ML IV SOLN

[Series 4: fl_tof_2d · axial · 3.0mm · 0.39mm/px · z∈[+32,+130]mm · 7 of 50 slices shown]
[im 1/50]
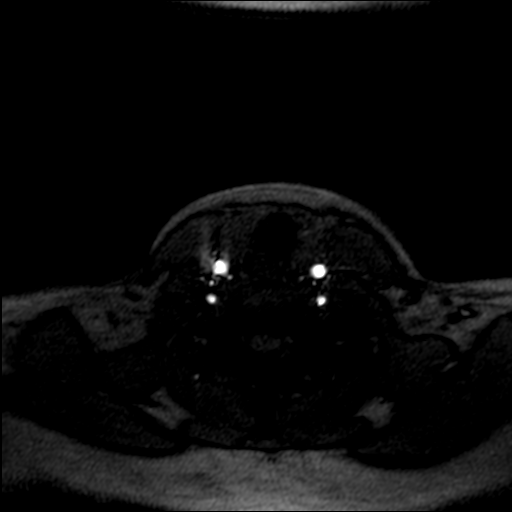
[im 9/50]
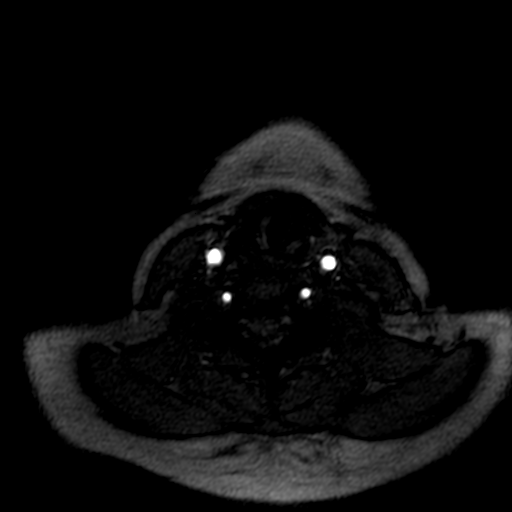
[im 17/50]
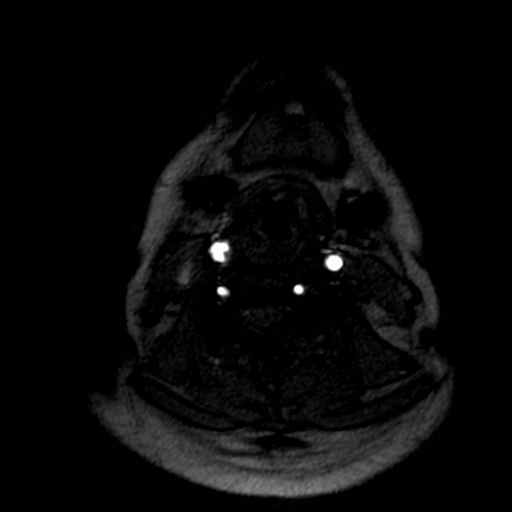
[im 25/50]
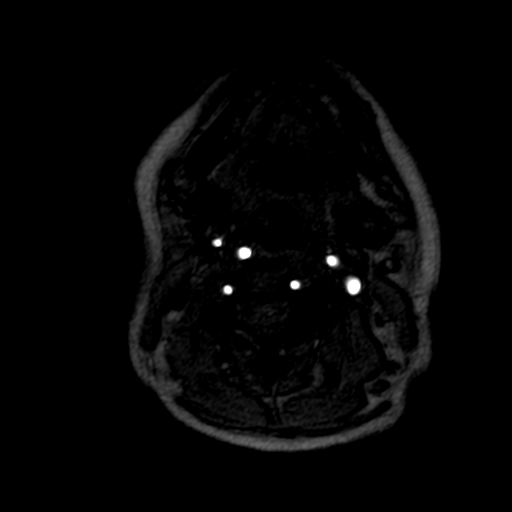
[im 33/50]
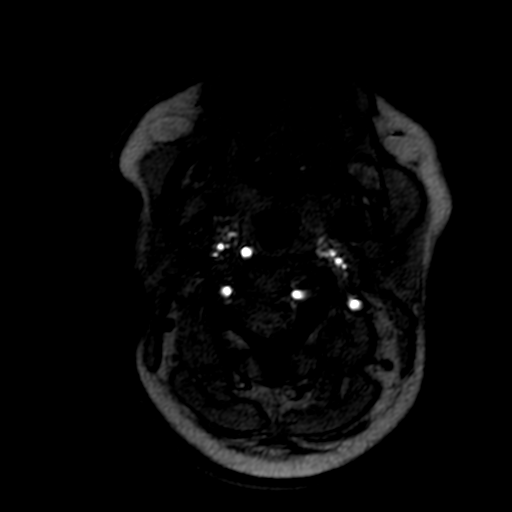
[im 41/50]
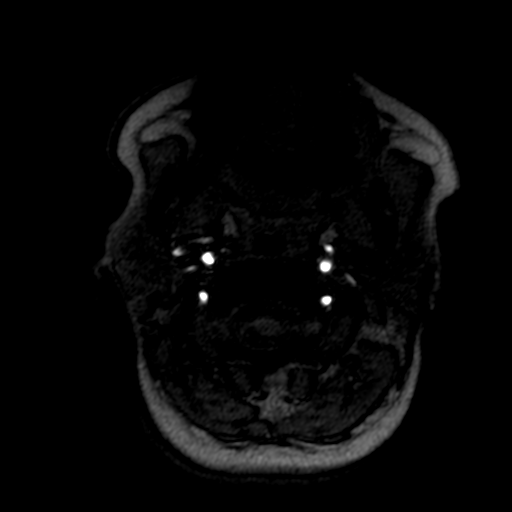
[im 50/50]
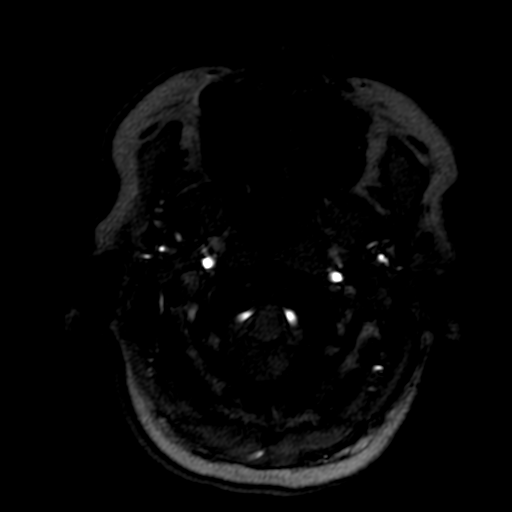

[Series 7: fl_tof_2d_mip_tra · axial · 101.5mm · 0.39mm/px · 1 of 1 slices shown]
[im 1/1]
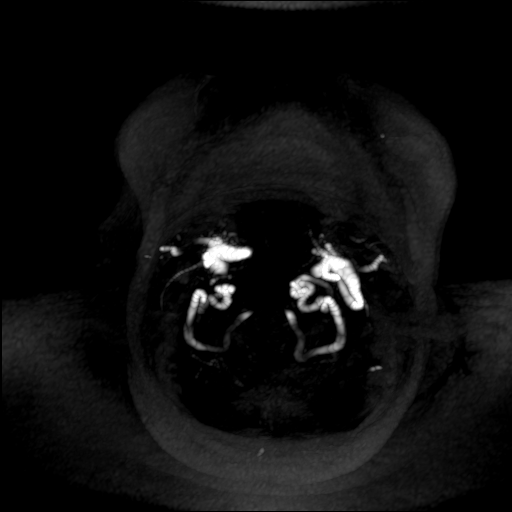

[Series 9: (id)_tt=1.0s · coronal · 0.8mm · 0.78mm/px · 9 of 80 slices shown (1 of 2)]
[im 1/80]
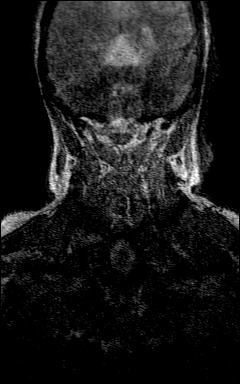
[im 14/80]
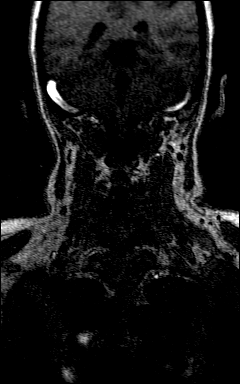
[im 27/80]
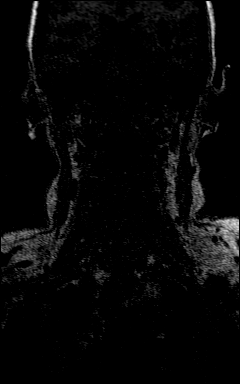
[im 33/80]
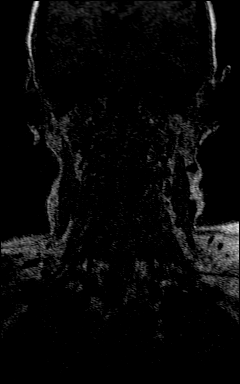
[im 40/80]
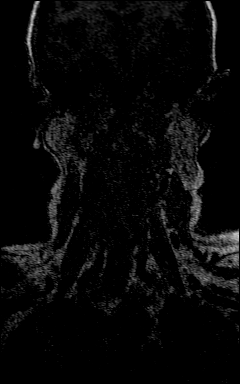
[im 47/80]
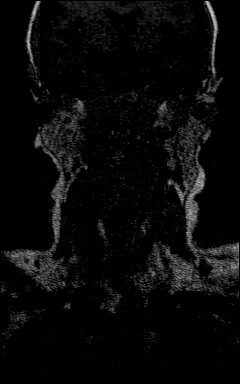
[im 53/80]
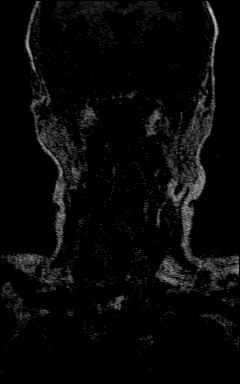
[im 66/80]
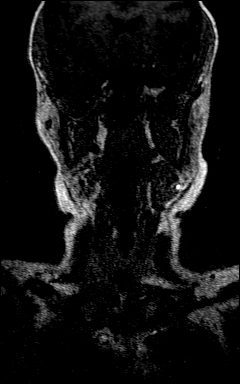
[im 80/80]
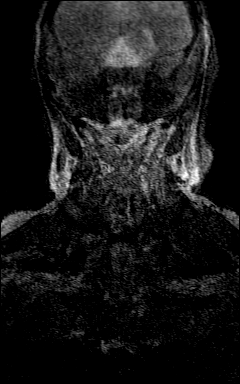

[Series 11: (id)_tt=1.0s · coronal · 0.8mm · 0.78mm/px · 9 of 80 slices shown (2 of 2)]
[im 1/80]
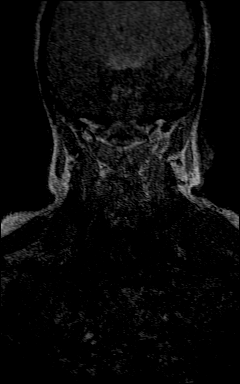
[im 14/80]
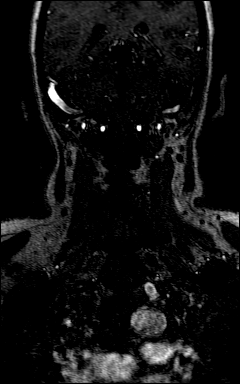
[im 27/80]
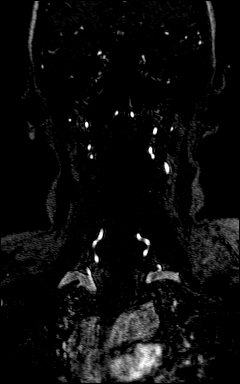
[im 33/80]
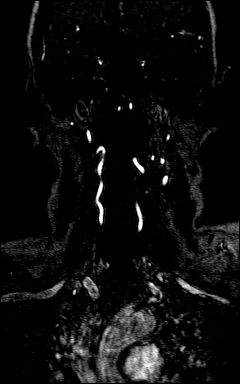
[im 40/80]
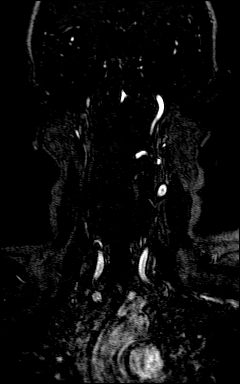
[im 47/80]
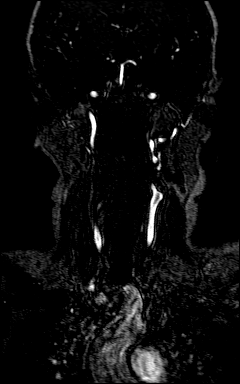
[im 53/80]
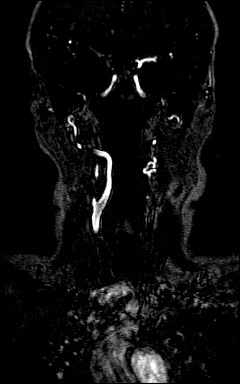
[im 66/80]
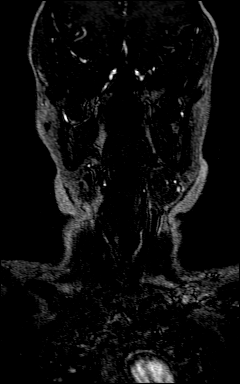
[im 80/80]
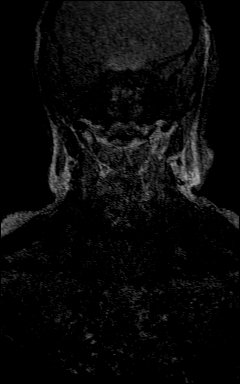

[Series 13: sub_s11-s9_1 · coronal · 0.8mm · 0.78mm/px · 8 of 80 slices shown]
[im 1/80]
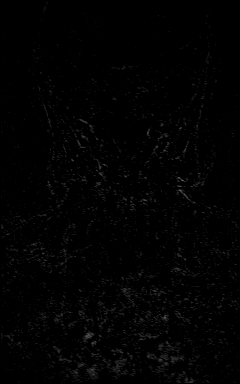
[im 14/80]
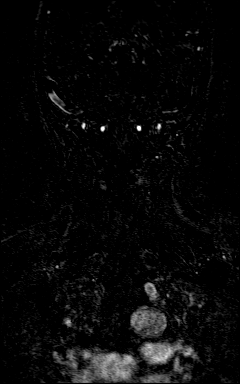
[im 27/80]
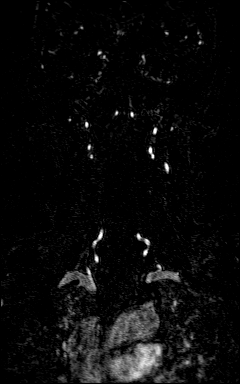
[im 33/80]
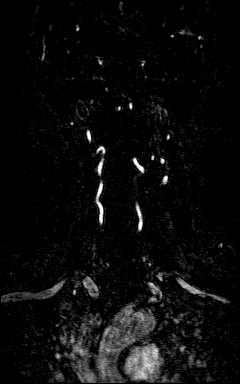
[im 47/80]
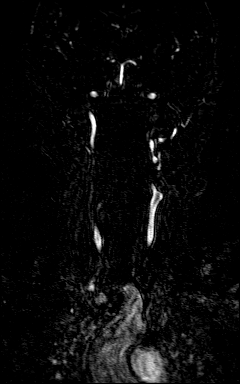
[im 53/80]
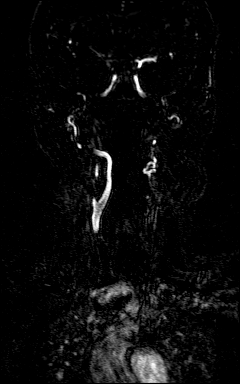
[im 66/80]
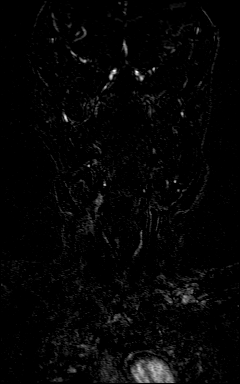
[im 80/80]
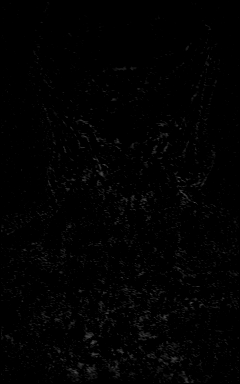

[Series 15: biffer a-p · axial · 0.22mm/px · 1 of 1 slices shown]
[im 1/1]
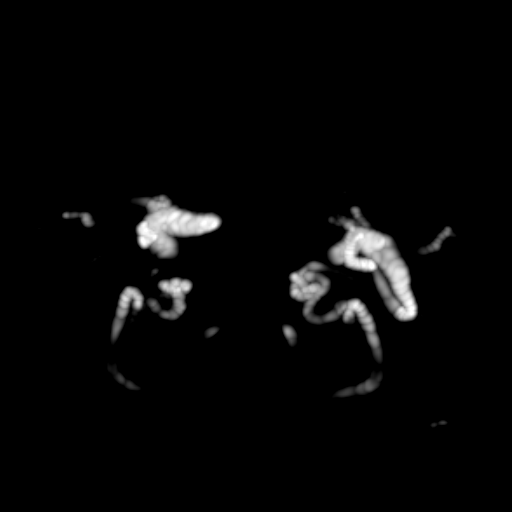

[35 of 48 positions shown; findings below may reference images not displayed]

FINDINGS: MRA NECK FINDINGS

Aortic arch: Normal 3 vessel aortic branching pattern. The
visualized subclavian arteries are normal.

Right carotid system: Normal course and caliber without stenosis or
evidence of dissection.

Left carotid system: Normal course and caliber without stenosis or
evidence of dissection.

Vertebral arteries: Codominant. Vertebral artery origins are normal.
Vertebral arteries are normal in caliber to the vertebrobasilar
confluence without stenosis or evidence of dissection. Increased
tortuosity at the V1 and V2 segments.

MRA HEAD FINDINGS

Anterior circulation: The visualized portions of the distal cervical
and intracranial internal carotid arteries are widely patent with
normal flow related enhancement. The bilateral anterior cerebral
arteries and middle cerebral arteries are widely patent with
antegrade flow without high-grade flow-limiting stenosis or proximal
branch occlusion. No intracranial aneurysm within the anterior
circulation.

The vertebral arteries are widely patent with antegrade flow.
Vertebrobasilar junction and basilar artery are widely patent with
antegrade flow without evidence of basilar stenosis or aneurysm.
Posterior cerebral arteries are normal bilaterally. No intracranial
aneurysm within the posterior circulation.
IMPRESSION: Unremarkable MR angiogram of the head and neck.

## 2024-02-10 ENCOUNTER — Encounter: Payer: Self-pay | Admitting: Family Medicine

## 2024-02-12 ENCOUNTER — Telehealth: Payer: Self-pay

## 2024-02-12 ENCOUNTER — Ambulatory Visit (INDEPENDENT_AMBULATORY_CARE_PROVIDER_SITE_OTHER): Admitting: Family Medicine

## 2024-02-12 VITALS — BP 131/78 | HR 66 | Ht 63.0 in | Wt 224.2 lb

## 2024-02-12 DIAGNOSIS — Z1231 Encounter for screening mammogram for malignant neoplasm of breast: Secondary | ICD-10-CM | POA: Diagnosis not present

## 2024-02-12 DIAGNOSIS — N644 Mastodynia: Secondary | ICD-10-CM | POA: Diagnosis not present

## 2024-02-12 DIAGNOSIS — G629 Polyneuropathy, unspecified: Secondary | ICD-10-CM | POA: Diagnosis not present

## 2024-02-12 DIAGNOSIS — M797 Fibromyalgia: Secondary | ICD-10-CM | POA: Diagnosis not present

## 2024-02-12 DIAGNOSIS — Z23 Encounter for immunization: Secondary | ICD-10-CM | POA: Diagnosis not present

## 2024-02-12 MED ORDER — SAVELLA TITRATION PACK 12.5 & 25 & 50 MG PO MISC: ORAL | 1 refills | Status: AC

## 2024-02-12 NOTE — Patient Instructions (Addendum)
  VISIT SUMMARY: You visited us  today due to worsening pain and fatigue related to fibromyalgia, along with other symptoms including breast pain, constipation, and fluid retention. We discussed new treatment options and made plans for further evaluations.  YOUR PLAN: FIBROMYALGIA WITH CHRONIC PAIN AND FATIGUE: Your fibromyalgia symptoms have worsened, causing significant pain and fatigue. Previous medications were not effective. -Start Savella with the titration pack as per instructions. -Consider therapy for stress management and social support. -Use [email or web address] to find a therapist.  I have ordered a diagnostic mammogram.  Please get in touch with the breast center to schedule this                     Contains text generated by [employer].                                 Contains text generated by [employer].

## 2024-02-12 NOTE — Progress Notes (Signed)
    SUBJECTIVE:   CHIEF COMPLAINT / HPI:     Discussed the use of AI scribe software for clinical note transcription with the patient, who gave verbal consent to proceed.  History of Present Illness Donna Norton is a 58 year old female with fibromyalgia who presents with worsening pain and fatigue.  Generalized pain and fatigue - Persistent and worsening pain throughout the body, most pronounced in the hands and arms - Severe fatigue and exhaustion after minimal physical activity, such as cleaning or preparing meals - Symptoms have been present and worsening for the past 1.5 to 2 weeks - Significant pain in the legs and knees contributing to exhaustion - Traveling exacerbates pain and fatigue, unable to drive long distances - Currently not working due to severity of symptoms  Medication intolerance and management - Previously treated with Cymbalta  and gabapentin , which caused grogginess, excessive daytime sleepiness, and insomnia - Occasionally takes Advil  at night to aid sleep, but avoids daytime use due to drowsiness  Reports she is trying to find a therapist soon     PERTINENT  PMH / PSH: Fibromyalgia, hypertension, mild CAD, MGUS  OBJECTIVE:   LMP 04/23/2009    General: NAD, pleasant, able to participate in exam Respiratory: No respiratory distress Skin: warm and dry, no rashes noted Psych: Normal affect and mood  ASSESSMENT/PLAN:     Assessment & Plan Fibromyalgia Polyneuropathy Workup for other causes has been nonrevealing.  Feel her symptoms are related to fibromyalgia Cymbalta  and gabapentin  have not been helpful for her Trial Savella as we discussed this at the last visit.  Sent Rx for medication pack, instructions on titration Follow-up in 4 to 6 weeks Recommend establishing with behavioral therapist Encounter for screening mammogram for malignant neoplasm of breast Breast pain Patient reports bilateral breast discomfort, has not felt any masses,  she was going to schedule her screening mammogram and was advised to get an order for diagnostic instead.  I have placed this  Flu shot today  Donna Coward, MD [facility]

## 2024-02-12 NOTE — Telephone Encounter (Signed)
 Pt calling to ask for a referral for a diagnostic bilateral mammogram and US . Pt is having pain in her right breast.   Please advise,  Margit Dimes, CMA

## 2024-02-14 ENCOUNTER — Other Ambulatory Visit

## 2024-02-18 ENCOUNTER — Other Ambulatory Visit: Payer: Self-pay | Admitting: Family Medicine

## 2024-02-18 DIAGNOSIS — N644 Mastodynia: Secondary | ICD-10-CM

## 2024-02-18 DIAGNOSIS — Z23 Encounter for immunization: Secondary | ICD-10-CM

## 2024-02-18 DIAGNOSIS — G629 Polyneuropathy, unspecified: Secondary | ICD-10-CM

## 2024-02-18 DIAGNOSIS — M797 Fibromyalgia: Secondary | ICD-10-CM

## 2024-02-18 DIAGNOSIS — Z1231 Encounter for screening mammogram for malignant neoplasm of breast: Secondary | ICD-10-CM

## 2024-02-24 ENCOUNTER — Telehealth: Payer: Self-pay | Admitting: Neurology

## 2024-02-24 NOTE — Telephone Encounter (Signed)
 Pt called to Cancel appt due to being sick ''  Appt Canceled

## 2024-02-25 ENCOUNTER — Ambulatory Visit: Admitting: Oncology

## 2024-02-27 ENCOUNTER — Institutional Professional Consult (permissible substitution): Admitting: Neurology

## 2024-03-03 ENCOUNTER — Ambulatory Visit

## 2024-03-03 ENCOUNTER — Ambulatory Visit
Admission: RE | Admit: 2024-03-03 | Discharge: 2024-03-03 | Disposition: A | Discharge status: Home / Self Care | Source: Ambulatory Visit | Attending: Family Medicine | Admitting: Family Medicine

## 2024-03-03 DIAGNOSIS — N644 Mastodynia: Secondary | ICD-10-CM | POA: Diagnosis not present

## 2024-03-06 DIAGNOSIS — E7841 Elevated Lipoprotein(a): Secondary | ICD-10-CM | POA: Diagnosis not present

## 2024-03-07 ENCOUNTER — Ambulatory Visit: Payer: Self-pay | Admitting: Internal Medicine

## 2024-03-07 DIAGNOSIS — E785 Hyperlipidemia, unspecified: Secondary | ICD-10-CM

## 2024-03-07 LAB — LIPID PANEL
Chol/HDL Ratio: 4.2 ratio (ref 0.0–4.4)
Cholesterol, Total: 189 mg/dL (ref 100–199)
HDL: 45 mg/dL (ref >39)
LDL Chol Calc (NIH): 115 mg/dL — ABNORMAL HIGH (ref 0–99)
Triglycerides: 167 mg/dL — ABNORMAL HIGH (ref 0–149)
VLDL Cholesterol Cal: 29 mg/dL (ref 5–40)

## 2024-03-07 LAB — HEPATIC FUNCTION PANEL
ALT: 15 IU/L (ref 0–32)
AST: 19 IU/L (ref 0–40)
Albumin: 4.4 g/dL (ref 3.8–4.9)
Alkaline Phosphatase: 87 IU/L (ref 49–135)
Bilirubin Total: 1.1 mg/dL (ref 0.0–1.2)
Bilirubin, Direct: 0.27 mg/dL (ref 0.00–0.40)
Total Protein: 7.1 g/dL (ref 6.0–8.5)

## 2024-03-17 ENCOUNTER — Inpatient Hospital Stay: Attending: Oncology

## 2024-03-17 DIAGNOSIS — D472 Monoclonal gammopathy: Secondary | ICD-10-CM | POA: Diagnosis not present

## 2024-03-17 LAB — CBC WITH DIFFERENTIAL (CANCER CENTER ONLY)
Abs Immature Granulocytes: 0.03 K/uL (ref 0.00–0.07)
Basophils Absolute: 0.1 K/uL (ref 0.0–0.1)
Basophils Relative: 1 %
Eosinophils Absolute: 0.2 K/uL (ref 0.0–0.5)
Eosinophils Relative: 2 %
HCT: 43.8 % (ref 36.0–46.0)
Hemoglobin: 15.1 g/dL — ABNORMAL HIGH (ref 12.0–15.0)
Immature Granulocytes: 0 %
Lymphocytes Relative: 31 %
Lymphs Abs: 3.9 K/uL (ref 0.7–4.0)
MCH: 30.9 pg (ref 26.0–34.0)
MCHC: 34.5 g/dL (ref 30.0–36.0)
MCV: 89.8 fL (ref 80.0–100.0)
Monocytes Absolute: 0.5 K/uL (ref 0.1–1.0)
Monocytes Relative: 4 %
Neutro Abs: 8 K/uL — ABNORMAL HIGH (ref 1.7–7.7)
Neutrophils Relative %: 62 %
Platelet Count: 256 K/uL (ref 150–400)
RBC: 4.88 MIL/uL (ref 3.87–5.11)
RDW: 12.4 % (ref 11.5–15.5)
WBC Count: 12.6 K/uL — ABNORMAL HIGH (ref 4.0–10.5)
nRBC: 0 % (ref 0.0–0.2)

## 2024-03-17 LAB — CMP (CANCER CENTER ONLY)
ALT: 18 U/L (ref 0–44)
AST: 22 U/L (ref 15–41)
Albumin: 4.4 g/dL (ref 3.5–5.0)
Alkaline Phosphatase: 91 U/L (ref 38–126)
Anion gap: 13 (ref 5–15)
BUN: 10 mg/dL (ref 6–20)
CO2: 25 mmol/L (ref 22–32)
Calcium: 10 mg/dL (ref 8.9–10.3)
Chloride: 101 mmol/L (ref 98–111)
Creatinine: 0.63 mg/dL (ref 0.44–1.00)
GFR, Estimated: >60 mL/min (ref >60)
Glucose, Bld: 136 mg/dL — ABNORMAL HIGH (ref 70–99)
Potassium: 4 mmol/L (ref 3.5–5.1)
Sodium: 139 mmol/L (ref 135–145)
Total Bilirubin: 0.8 mg/dL (ref 0.0–1.2)
Total Protein: 7.8 g/dL (ref 6.5–8.1)

## 2024-03-18 LAB — KAPPA/LAMBDA LIGHT CHAINS
Kappa free light chain: 17.6 mg/L (ref 3.3–19.4)
Kappa, lambda light chain ratio: 1.08 (ref 0.26–1.65)
Lambda free light chains: 16.3 mg/L (ref 5.7–26.3)

## 2024-03-20 LAB — MULTIPLE MYELOMA PANEL, SERUM
Albumin SerPl Elph-Mcnc: 3.4 g/dL (ref 2.9–4.4)
Albumin/Glob SerPl: 1 (ref 0.7–1.7)
Alpha 1: 0.3 g/dL (ref 0.0–0.4)
Alpha2 Glob SerPl Elph-Mcnc: 0.9 g/dL (ref 0.4–1.0)
B-Globulin SerPl Elph-Mcnc: 1.4 g/dL — ABNORMAL HIGH (ref 0.7–1.3)
Gamma Glob SerPl Elph-Mcnc: 1.1 g/dL (ref 0.4–1.8)
Globulin, Total: 3.7 g/dL (ref 2.2–3.9)
IgA: 345 mg/dL (ref 87–352)
IgG (Immunoglobin G), Serum: 1170 mg/dL (ref 586–1602)
IgM (Immunoglobulin M), Srm: 120 mg/dL (ref 26–217)
M Protein SerPl Elph-Mcnc: 0.3 g/dL — ABNORMAL HIGH
Total Protein ELP: 7.1 g/dL (ref 6.0–8.5)

## 2024-03-31 ENCOUNTER — Telehealth: Payer: Self-pay

## 2024-03-31 ENCOUNTER — Inpatient Hospital Stay: Attending: Oncology | Admitting: Oncology

## 2024-03-31 ENCOUNTER — Encounter: Payer: Self-pay | Admitting: Oncology

## 2024-03-31 VITALS — BP 137/80 | HR 76 | Temp 98.0°F | Resp 17 | Wt 223.4 lb

## 2024-03-31 DIAGNOSIS — D472 Monoclonal gammopathy: Secondary | ICD-10-CM | POA: Insufficient documentation

## 2024-03-31 NOTE — Assessment & Plan Note (Addendum)
 As part of workup for polyneuropathy, SPEP was obtained in June 2025.  It showed faint amount of M spike at 0.2 g/dL.  No CRAB features.  On her consultation with us  on 10/28/2023, labs showed normal hemoglobin of 15.1, MCV 90.  Mild relative lymphocytosis of 4500 noted.  White count was 13,100.  If persistent lymphocytosis is noted, we will obtain flow cytometry on return visit.  CMP showed creatinine of 0.71, calcium  normal at 10.1.  LDH normal.   SPEP showed 0.3 g/dL of M spike, IFE showed it to be IgG lambda type.  Quantitative immunoglobulins were within normal limits.  Serum free kappa was normal at 19.7 mg/L, lambda normal at 17.8 mg/L, ratio normal at 1.11.  Beta-2  microglobulin was normal at 1.6.  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  On 12/02/2023, we did repeat myeloma labs and also checked flow cytometry of peripheral blood given intermittent lymphocytosis.  Picture remains consistent with MGUS with very small amount of M spike at 0.3 g/dL.  Free kappa and lambda were also within normal limits currently.  Flow cytometry of peripheral blood was unremarkable.  Reviewed recent labs from 03/17/2024.  M protein is stable at 0.3 g/dL.  Free kappa and lambda were normal with normal ratio.  Picture remains consistent with MGUS.   She was provided reassurance.  RTC in 6 months with labs 2 weeks prior to return visit.  - Recommend follow-up with primary care physician for further evaluation of symptoms not related to MGUS. - Consider referral to a neurologist for intermittent headaches and neck pain. - Consider referral for GI workup, including colonoscopy or endoscopy, for digestive issues.

## 2024-03-31 NOTE — Progress Notes (Signed)
 [facility]  HEMATOLOGY CLINIC PROGRESS NOTE  PATIENT NAME: Donna Norton   MR#: [id number] DOB: 1965/11/19  Patient Care Team: [name], MD as PCP - General (Family Medicine) Thukkani, Arun K, MD as PCP - Cardiology (Cardiology)  Date of visit: 03/31/2024   ASSESSMENT & PLAN:   Donna Norton is a 58 y.o. lady with a past medical history of prediabetes, polyneuropathy, CAD, dyslipidemia, hypertension, was referred to our service in July 2025 for evaluation of abnormal SPEP.   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  MGUS (monoclonal gammopathy of unknown significance) As part of workup for polyneuropathy, SPEP was obtained in June 2025.  It showed faint amount of M spike at 0.2 g/dL.  No CRAB features.  On her consultation with us  on 10/28/2023, labs showed normal hemoglobin of 15.1, MCV 90.  Mild relative lymphocytosis of 4500 noted.  White count was 13,100.  If persistent lymphocytosis is noted, we will obtain flow cytometry on return visit.  CMP showed creatinine of 0.71, calcium  normal at 10.1.  LDH normal.   SPEP showed 0.3 g/dL of M spike, IFE showed it to be IgG lambda type.  Quantitative immunoglobulins were within normal limits.  Serum free kappa was normal at 19.7 mg/L, lambda normal at 17.8 mg/L, ratio normal at 1.11.  Beta-2  microglobulin was normal at 1.6.  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  On 12/02/2023, we did repeat myeloma labs and also checked flow cytometry of peripheral blood given intermittent lymphocytosis.  Picture remains consistent with MGUS with very small amount of M spike at 0.3 g/dL.  Free kappa and lambda were also within normal limits currently.  Flow cytometry of peripheral blood was unremarkable.  Reviewed recent labs from 03/17/2024.  M protein is stable at 0.3 g/dL.  Free kappa and lambda were normal with normal ratio.  Picture remains  consistent with MGUS.   She was provided reassurance.  RTC in 6 months with labs 2 weeks prior to return visit.  - Recommend follow-up with primary care physician for further evaluation of symptoms not related to MGUS. - Consider referral to a neurologist for intermittent headaches and neck pain. - Consider referral for GI workup, including colonoscopy or endoscopy, for digestive issues.   I spent a total of 32 minutes during this encounter with the patient including review of chart and various tests results, discussions about plan of care and coordination of care plan.  I reviewed lab results and outside records for this visit and discussed relevant results with the patient. Diagnosis, plan of care and treatment options were also discussed in detail with the patient. Opportunity provided to ask questions and answers provided to her apparent satisfaction. Provided instructions to call our clinic with any problems, questions or concerns prior to return visit. I recommended to continue follow-up with PCP and sub-specialists. She verbalized understanding and agreed with the plan. No barriers to learning was detected.  Chinita Patten, MD  03/31/2024 6:16 PM  [facility] - A DEPT OF [facility] [address] [place] [address] Dept: [phone] Dept Fax: [phone]   CHIEF COMPLAINT/ REASON FOR VISIT:  IgG lambda MGUS  INTERVAL HISTORY:  Discussed the use of AI scribe software for clinical note transcription with the patient, who gave verbal consent to proceed.  History of Present Illness Donna Vandruff  Norton is a 58 year old female with MGUS who presents for routine follow-up.  She has stable M protein levels at 0.3, normal kappa and lambda light chains, and a normal ratio. Kidney function, calcium , and hemoglobin levels are normal. Her white blood cell count is slightly elevated at 12,600, which has fluctuated in  the past.  She experiences bloating and lower back pain, which she attributes to inefficient kidney filtration. She maintains hydration by drinking a lot of water, avoids sodas, and carries a water bottle at all times. She also tries to eat a balanced diet.  She experiences sleep disturbances and does not sleep well, opting to turn off her phone ringer at night to minimize disruptions.   SUMMARY OF HEMATOLOGIC HISTORY:  She experiences tingling and numbness in her hands, extending past her elbows, with the right side more affected than the left. Blood tests revealed a small amount of M protein (0.2 grams), prompting further evaluation in our clinic.   She has intermittent hip and knee pain, attributed to daily activities such as walking up two flights of stairs. She describes her joints as 'very weak' and has difficulty with movements like getting in and out of the tub or rising from the floor.   She recalls a previous hospitalization for a severe infection following dental work, which led to neck pain and stiffness. This neck pain persists intermittently, causing significant stiffness upon waking, despite changing pillows. She has not been admitted recently but visited the emergency room for this issue.   No new bone pains or joint pains beyond what she has described. No anemia, kidney dysfunction, or elevated calcium  levels.  As part of workup for polyneuropathy, SPEP was obtained in June 2025.  It showed faint amount of M spike at 0.2 g/dL.   No CRAB features.   On her consultation with us  on 10/28/2023, labs showed normal hemoglobin of 15.1, MCV 90.  Mild relative lymphocytosis of 4500 noted.  White count was 13,100.  If persistent lymphocytosis is noted, we will obtain flow cytometry on return visit.  CMP showed creatinine of 0.71, calcium  normal at 10.1.  LDH normal.   SPEP showed 0.3 g/dL of M spike, IFE showed it to be IgG lambda type.  Quantitative immunoglobulins were within normal  limits.  Serum free kappa was normal at 19.7 mg/L, lambda normal at 17.8 mg/L, ratio normal at 1.11.  Beta-2  microglobulin was normal at 1.6.  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  Repeat myeloma labs in November 2025 also showed findings consistent with MGUS.   She was provided reassurance.  I have reviewed the past medical history, past surgical history, social history and family history with the patient and they are unchanged from previous note.  ALLERGIES: She is allergic to duloxetine , atorvastatin, ceclor [cefaclor], crestor  [rosuvastatin ], hydrocodone  bit-homatrop mbr, percocet [oxycodone -acetaminophen ], trazodone hcl, and methimazole.  MEDICATIONS:  Current Outpatient Medications  Medication Sig Dispense Refill   acetaminophen  (TYLENOL ) 500 MG tablet Take 1,000 mg by mouth every 6 (six) hours as needed for moderate pain (pain score 4-6).     albuterol  (PROAIR  HFA) 108 (90 Base) MCG/ACT inhaler Inhale 2 puffs into the lungs every 6 (six) hours as needed for wheezing or shortness of breath. 1 each 2   aspirin  EC 81 MG tablet Take 1 tablet (81 mg total) by mouth daily. Swallow whole. 90 tablet 3   diclofenac  (VOLTAREN )  50 MG EC tablet TAKE 1 TABLET (50 MG TOTAL) BY MOUTH 2 (TWO) TIMES DAILY WITH A MEAL. 60 tablet 0   ezetimibe  (ZETIA ) 10 MG tablet Take 1 tablet (10 mg total) by mouth daily. 90 tablet 3   furosemide  (LASIX ) 20 MG tablet Take 1 tablet (20 mg total) by mouth daily. 15 tablet 0   gabapentin  (NEURONTIN ) 100 MG capsule Take 1 capsule (100 mg total) by mouth 3 (three) times daily. 90 capsule 3   ibuprofen  (ADVIL ) 200 MG tablet Take 200 mg by mouth every 6 (six) hours as needed.     Multiple Vitamins-Minerals (MULTIVITAMIN WITH MINERALS) tablet Take 1 tablet by mouth daily.     rosuvastatin  (CRESTOR ) 5 MG tablet Take 1 tablet (5 mg total) by mouth daily. 90 tablet 3   valACYclovir (VALTREX)  500 MG tablet Take by mouth.     Milnacipran  HCl (SAVELLA  TITRATION PACK) 12.5 & 25 & 50 MG MISC Take as instructed on the medication pack. 12.5 mg once daily on day 1, then 12.5 mg twice daily on days 2-3, 25 mg twice daily on days 4-7, then to usual dosage of 50 mg twice daily thereafter (Patient not taking: Reported on 03/31/2024) 55 each 1   No current facility-administered medications for this visit.     REVIEW OF SYSTEMS:    Review of Systems - Oncology  All other pertinent systems were reviewed with the patient and are negative.  PHYSICAL EXAMINATION:    Onc Performance Status - 03/31/24 1510       ECOG Perf Status   ECOG Perf Status Restricted in physically strenuous activity but ambulatory and able to carry out work of a light or sedentary nature, e.g., light house work, office work   weakness after all activities over last 1.5 weeks     KPS SCALE   KPS % SCORE Normal activity with effort, some s/s of disease           Vitals:   03/31/24 1506  BP: 137/80  Pulse: 76  Resp: 17  Temp: 98 F (36.7 C)  SpO2: 97%   Filed Weights   03/31/24 1506  Weight: 223 lb 6.4 oz (101.3 kg)    Physical Exam Constitutional:      General: She is not in acute distress.    Appearance: Normal appearance.  HENT:     Head: Normocephalic and atraumatic.  Cardiovascular:     Rate and Rhythm: Normal rate.  Pulmonary:     Effort: Pulmonary effort is normal. No respiratory distress.  Abdominal:     General: There is no distension.  Neurological:     General: No focal deficit present.     Mental Status: She is alert and oriented to person, place, and time.  Psychiatric:        Mood and Affect: Mood normal.        Behavior: Behavior normal.     LABORATORY DATA:   I have reviewed the data as listed.  No results found for any visits on 03/31/24.  Recent Results (from the past 2160 hours)  Hepatic function panel     Status: None   Collection Time: 03/06/24  9:38 AM   Result Value Ref Range   Total Protein 7.1 6.0 - 8.5 g/dL   Albumin 4.4 3.8 - 4.9 g/dL   Bilirubin Total 1.1 0.0 - 1.2 mg/dL   Bilirubin, Direct 9.72 0.00 - 0.40 mg/dL   Alkaline Phosphatase 87 49 - 135 IU/L  AST 19 0 - 40 IU/L   ALT 15 0 - 32 IU/L  Lipid panel     Status: Abnormal   Collection Time: 03/06/24  9:38 AM  Result Value Ref Range   Cholesterol, Total 189 100 - 199 mg/dL   Triglycerides 832 (H) 0 - 149 mg/dL   HDL 45 >60 mg/dL   VLDL Cholesterol Cal 29 5 - 40 mg/dL   LDL Chol Calc (NIH) 884 (H) 0 - 99 mg/dL   Chol/HDL Ratio 4.2 0.0 - 4.4 ratio    Comment:                                   T. Chol/HDL Ratio                                             Men  Women                               1/2 Avg.Risk  3.4    3.3                                   Avg.Risk  5.0    4.4                                2X Avg.Risk  9.6    7.1                                3X Avg.Risk 23.4   11.0   Kappa/lambda light chains     Status: None   Collection Time: 03/17/24  3:15 PM  Result Value Ref Range   Kappa free light chain 17.6 3.3 - 19.4 mg/L   Lambda free light chains 16.3 5.7 - 26.3 mg/L   Kappa, lambda light chain ratio 1.08 0.26 - 1.65    Comment: (NOTE) Performed At: [facility] [address] [place], [place] [id number] Jennette Shorter MD Ey:[id number]   Multiple Myeloma Panel (SPEP&IFE w/QIG)     Status: Abnormal   Collection Time: 03/17/24  3:15 PM  Result Value Ref Range   IgG (Immunoglobin G), Serum 1,170 586 - 1,602 mg/dL   IgA 654 87 - 647 mg/dL   IgM (Immunoglobulin M), Srm 120 26 - 217 mg/dL   Total Protein ELP 7.1 6.0 - 8.5 g/dL   Albumin SerPl Elph-Mcnc 3.4 2.9 - 4.4 g/dL   Alpha 1 0.3 0.0 - 0.4 g/dL   Alpha2 Glob SerPl Elph-Mcnc 0.9 0.4 - 1.0 g/dL   B-Globulin SerPl Elph-Mcnc 1.4 (H) 0.7 - 1.3 g/dL   Gamma Glob SerPl Elph-Mcnc 1.1 0.4 - 1.8 g/dL   M Protein SerPl Elph-Mcnc 0.3 (H) Not Observed g/dL   Globulin, Total 3.7 2.2 - 3.9 g/dL    Albumin/Glob SerPl 1.0 0.7 - 1.7   IFE 1 Comment (A)     Comment: (NOTE) Immunofixation shows IgG monoclonal protein with lambda light chain specificity.    Please Note Comment     Comment: (NOTE) Protein electrophoresis scan will follow via computer, mail,  or courier delivery. Performed At: [facility] [address] [place], [place] [id number] Jennette Shorter MD Ey:[id number]   CMP (Cancer Center only)     Status: Abnormal   Collection Time: 03/17/24  3:15 PM  Result Value Ref Range   Sodium 139 135 - 145 mmol/L   Potassium 4.0 3.5 - 5.1 mmol/L   Chloride 101 98 - 111 mmol/L   CO2 25 22 - 32 mmol/L   Glucose, Bld 136 (H) 70 - 99 mg/dL    Comment: Glucose reference range applies only to samples taken after fasting for at least 8 hours.   BUN 10 6 - 20 mg/dL   Creatinine 9.36 9.55 - 1.00 mg/dL   Calcium  10.0 8.9 - 10.3 mg/dL   Total Protein 7.8 6.5 - 8.1 g/dL   Albumin 4.4 3.5 - 5.0 g/dL   AST 22 15 - 41 U/L   ALT 18 0 - 44 U/L   Alkaline Phosphatase 91 38 - 126 U/L   Total Bilirubin 0.8 0.0 - 1.2 mg/dL   GFR, Estimated >39 >39 mL/min    Comment: (NOTE) Calculated using the CKD-EPI Creatinine Equation (2021)    Anion gap 13 5 - 15    Comment: Performed at [facility], [address] [place], [place] [address]  CBC with Differential (Cancer Center Only)     Status: Abnormal   Collection Time: 03/17/24  3:15 PM  Result Value Ref Range   WBC Count 12.6 (H) 4.0 - 10.5 K/uL   RBC 4.88 3.87 - 5.11 MIL/uL   Hemoglobin 15.1 (H) 12.0 - 15.0 g/dL   HCT 56.1 63.9 - 53.9 %   MCV 89.8 80.0 - 100.0 fL   MCH 30.9 26.0 - 34.0 pg   MCHC 34.5 30.0 - 36.0 g/dL   RDW 87.5 88.4 - 84.4 %   Platelet Count 256 150 - 400 K/uL   nRBC 0.0 0.0 - 0.2 %   Neutrophils Relative % 62 %   Neutro Abs 8.0 (H) 1.7 - 7.7 K/uL   Lymphocytes Relative 31 %   Lymphs Abs 3.9 0.7 - 4.0 K/uL   Monocytes Relative 4 %   Monocytes Absolute 0.5 0.1 - 1.0 K/uL   Eosinophils  Relative 2 %   Eosinophils Absolute 0.2 0.0 - 0.5 K/uL   Basophils Relative 1 %   Basophils Absolute 0.1 0.0 - 0.1 K/uL   Immature Granulocytes 0 %   Abs Immature Granulocytes 0.03 0.00 - 0.07 K/uL    Comment: Performed at [facility], [address] [place], [place] [address]     RADIOGRAPHIC STUDIES:  No recent pertinent imaging studies available to review.   Future Appointments  Date Time Provider Department Center  04/30/2024 10:00 AM Alvstad, Kristin L, RPH-CPP [facility]     This document was completed utilizing speech recognition software. Grammatical errors, random word insertions, pronoun errors, and incomplete sentences are an occasional consequence of this system due to software limitations, ambient noise, and hardware issues. Any formal questions or concerns about the content, text or information contained within the body of this dictation should be directly addressed to the provider for clarification.

## 2024-03-31 NOTE — Telephone Encounter (Signed)
 Called pt to discuss appt today, no answer, no voicemail set up. Mychart message sent

## 2024-04-20 ENCOUNTER — Encounter: Payer: Self-pay | Admitting: Family Medicine

## 2024-04-21 ENCOUNTER — Ambulatory Visit (INDEPENDENT_AMBULATORY_CARE_PROVIDER_SITE_OTHER): Admitting: Family Medicine

## 2024-04-21 ENCOUNTER — Encounter: Payer: Self-pay | Admitting: Family Medicine

## 2024-04-21 VITALS — BP 134/91 | HR 83 | Ht 63.0 in | Wt 223.8 lb

## 2024-04-21 DIAGNOSIS — K5900 Constipation, unspecified: Secondary | ICD-10-CM | POA: Diagnosis not present

## 2024-04-21 DIAGNOSIS — R21 Rash and other nonspecific skin eruption: Secondary | ICD-10-CM

## 2024-04-21 DIAGNOSIS — R1013 Epigastric pain: Secondary | ICD-10-CM

## 2024-04-21 DIAGNOSIS — Z1211 Encounter for screening for malignant neoplasm of colon: Secondary | ICD-10-CM

## 2024-04-21 MED ORDER — OMEPRAZOLE 40 MG PO CPDR: 40.0000 mg | DELAYED_RELEASE_CAPSULE | Freq: Every day | ORAL | 3 refills | Status: AC

## 2024-04-21 NOTE — Patient Instructions (Signed)
 I am not sure exactly what caused your rash, but it does appear to be improving. If it worsens please return to the clinic or send us  a photo on MyChart.  I have referred you for colonoscopy.  For your belly pain we will get labs today - I will let you know the results. I would also like you to start taking Omeprazole daily to help with symptoms.  I recommend you get your Shingles vaccine from your pharmacy.

## 2024-04-21 NOTE — Progress Notes (Signed)
" ° ° °  SUBJECTIVE:   CHIEF COMPLAINT / HPI:   Rash, skin irritation Started 4 days ago, noticed small itchy spot on right mid abdomen. Then itching spread to left side as well. Now with similar symptoms to lower bilateral belly. Then painful, burning.  She also notes: Dull ache inside belly. Denies sharp pain but has noticed some bloating, occasional constipation alleviated with Miralax.  Sleep paralysis attack 4-5 nights ago, so tense, I was fighting it. When she woke up all she could do was catch her breath. Felt like it lasted longer than usual.  She has never had the shingles vaccine.  Due for colonoscopy.  PERTINENT  PMH / PSH: Reviewed. HTN, MGUS, neuropathy  OBJECTIVE:   BP (!) 134/91   Pulse 83   Ht 5' 3 (1.6 m)   Wt 223 lb 12.8 oz (101.5 kg)   LMP 04/23/2009   SpO2 100%   BMI 39.64 kg/m   General: well-appearing, no acute distress. HEENT: normocephalic, PERRLA, EOM grossly intact. Cardio: Regular rate, regular rhythm, no murmurs on exam. Pulm: Clear, no wheezing, no crackles. No increased work of breathing. Abdominal: bowel sounds present, soft, moderately tender in epigastric region, non-distended. No HSM. Skin: 5-6 small/pinpoint areas of irritation on abdomen, flanks, back which are flat with minimal erythema; no drainage, bleeding, crusting. Do not lie in a dermatomal pattern. No other rashes or skin abnormalities noted. Extremities: Moves all extremities equally. Neuro: Alert and oriented x3, speech normal in content, no facial asymmetry   ASSESSMENT/PLAN:   Assessment & Plan Epigastric pain Suspect GERD, gastric ulcer, versus other possible etiologies. Considered cholecystitis but pain is not primarily in RUQ. - H pylori testing today - trial omeprazole 40 mg daily - follow in in 2-3 weeks to reevaluate  Skin rash Unclear etiology, particularly given scattered distribution. Considered shingles given complaint of itching and burning, however  rash is bilateral and ultimately appearance not consistent with this. Possible that this is a contact dermatitis versus insect bite. Fortunately lesions appear to be resolving without issue - no medical intervention at this time. - if these worsen or reappear she will follow up in the clinic and/or send pictures via MyChart. Constipation, unspecified constipation type Infrequent constipation without red flag symptoms, but she is due for colonoscopy and agreeable to having this done. - referral placed to GI   Strongly encouraged Shingles vaccine at pharmacy and answered all questions; patient will consider.  Lauraine Norse, DO [facility] "

## 2024-04-23 ENCOUNTER — Encounter: Payer: Self-pay | Admitting: Family Medicine

## 2024-04-23 ENCOUNTER — Ambulatory Visit: Payer: Self-pay | Admitting: Family Medicine

## 2024-04-23 LAB — H. PYLORI BREATH TEST: H pylori Breath Test: NEGATIVE

## 2024-04-27 ENCOUNTER — Ambulatory Visit (INDEPENDENT_AMBULATORY_CARE_PROVIDER_SITE_OTHER): Admitting: Family Medicine

## 2024-04-27 ENCOUNTER — Encounter: Payer: Self-pay | Admitting: Family Medicine

## 2024-04-27 VITALS — BP 126/77 | HR 79 | Ht 62.0 in | Wt 220.4 lb

## 2024-04-27 DIAGNOSIS — R1011 Right upper quadrant pain: Secondary | ICD-10-CM

## 2024-04-27 NOTE — Patient Instructions (Addendum)
" °  VISIT SUMMARY: Today, you were seen for abdominal pain and swelling. We discussed your symptoms and possible causes, and we have a plan to investigate further.  YOUR PLAN: RIGHT UPPER QUADRANT PAIN, POSSIBLE BILIARY COLIC: Your pain is suggestive of biliary colic, which may be due to gallstones or sludge in your gallbladder. -We have ordered an ultrasound of your right upper quadrant to check your gallbladder, liver, and ducts for stones or sludge. -We have also ordered basic blood work to check for any signs of infection or obstruction. -Please avoid fatty and greasy foods as they can worsen your symptoms. -If you experience fever, vomiting, or worsening pain, seek immediate medical attention. -We will schedule the ultrasound at  if that is more convenient for you. -If gallstones are confirmed and your symptoms persist, we will discuss a potential referral to a surgeon.  Please schedule your colonoscopy [facility] [address] [place] [address], [place], [place] [address] Phone: [phone]                    Contains text generated by Abridge.                                 Contains text generated by Abridge.   "

## 2024-04-27 NOTE — Progress Notes (Signed)
" ° ° °  SUBJECTIVE:   CHIEF COMPLAINT / HPI:   Right sided rib pain and swelling Was evaluated for epigastric pain on 12/30 with Dr. Lafe  She was started on omeprazole  40mg  daily. H pylori testing was negative.  Discussed the use of AI scribe software for clinical note transcription with the patient, who gave verbal consent to proceed.  History of Present Illness Donna Norton is a 59 year old female who presents with abdominal pain and swelling.  RUQ Abdominal pain and swelling - Abdominal pain described as swelling fullness with burning sensation - Pain radiates to her side and up her back along the ribs at the bra line - Occasional radiation to the shoulder with a squeezing quality - Pain worsens with driving or sitting up. Sometimes after meals - Swelling characterized by fullness and pressure, with sensation of movement in the area - No history of abdominal surgeries other than hysterectomy  Gastrointestinal symptoms - No fever, nausea, vomiting, or blood in stool - Cold chills without documented fever - One episode of nearly black stool about a week ago, resolved after Miralax; current stools are brown - No yellow stools  Urinary symptoms - Dark yellow urine despite good fluid intake - No dysuria     PERTINENT  PMH / PSH: HTN, CAD, HLD  OBJECTIVE:   BP 126/77   Pulse 79   Ht 5' 2 (1.575 m)   Wt 220 lb 6.4 oz (100 kg)   LMP 04/23/2009   SpO2 100%   BMI 40.31 kg/m    General: NAD, pleasant, able to participate in exam Respiratory: No respiratory distress Abd: soft, non distended, TTP in RUQ with +Murphy. No rebound/guarding Skin: warm and dry, no rashes noted Psych: Normal affect and mood  ASSESSMENT/PLAN:    Assessment & Plan Right upper quadrant pain Right upper quadrant pain, possible biliary colic Pain suggestive of biliary colic, likely gallstones or sludge. +Murphy sign on exam. No fever, vomiting, or jaundice. Resolved black stools. High  suspicion for gallbladder-related etiology. - Ordered ultrasound of the right upper quadrant to evaluate gallbladder, liver, and ducts for stones or sludge. - Ordered basic blood work CBC, CMP to assess for infection or obstruction. Not jaundiced on exam - Advised to avoid fatty and greasy foods. - Instructed to seek immediate medical attention if experiencing fever, vomiting, or worsening pain. - Will discuss potential referral to surgeon if gallstones are confirmed and symptoms persist. - advised GI f/u for colonoscopy esp given remote hx of dark stools   Payton Coward, MD [facility] "

## 2024-04-28 ENCOUNTER — Ambulatory Visit: Payer: Self-pay | Admitting: Family Medicine

## 2024-04-28 ENCOUNTER — Ambulatory Visit
Admission: RE | Admit: 2024-04-28 | Discharge: 2024-04-28 | Disposition: A | Discharge status: Home / Self Care | Source: Ambulatory Visit | Attending: Family Medicine | Admitting: Family Medicine

## 2024-04-28 DIAGNOSIS — R1011 Right upper quadrant pain: Secondary | ICD-10-CM | POA: Insufficient documentation

## 2024-04-28 LAB — COMPREHENSIVE METABOLIC PANEL WITH GFR
ALT: 20 IU/L (ref 0–32)
AST: 20 IU/L (ref 0–40)
Albumin: 4.5 g/dL (ref 3.8–4.9)
Alkaline Phosphatase: 102 IU/L (ref 49–135)
BUN/Creatinine Ratio: 19 (ref 12–28)
BUN: 10 mg/dL (ref 8–27)
Bilirubin Total: 1.1 mg/dL (ref 0.0–1.2)
CO2: 21 mmol/L (ref 20–29)
Calcium: 10 mg/dL (ref 8.7–10.3)
Chloride: 100 mmol/L (ref 96–106)
Creatinine, Ser: 0.53 mg/dL — ABNORMAL LOW (ref 0.57–1.00)
Globulin, Total: 2.9 g/dL (ref 1.5–4.5)
Glucose: 79 mg/dL (ref 70–99)
Potassium: 4.3 mmol/L (ref 3.5–5.2)
Sodium: 141 mmol/L (ref 134–144)
Total Protein: 7.4 g/dL (ref 6.0–8.5)
eGFR: 106 mL/min/1.73

## 2024-04-28 LAB — CBC WITH DIFFERENTIAL/PLATELET
Basophils Absolute: 0.1 x10E3/uL (ref 0.0–0.2)
Basos: 1 %
EOS (ABSOLUTE): 0.2 x10E3/uL (ref 0.0–0.4)
Eos: 2 %
Hematocrit: 47.3 % — ABNORMAL HIGH (ref 34.0–46.6)
Hemoglobin: 15.7 g/dL (ref 11.1–15.9)
Immature Grans (Abs): 0 x10E3/uL (ref 0.0–0.1)
Immature Granulocytes: 0 %
Lymphocytes Absolute: 4.1 x10E3/uL — ABNORMAL HIGH (ref 0.7–3.1)
Lymphs: 38 %
MCH: 30.2 pg (ref 26.6–33.0)
MCHC: 33.2 g/dL (ref 31.5–35.7)
MCV: 91 fL (ref 79–97)
Monocytes Absolute: 0.5 x10E3/uL (ref 0.1–0.9)
Monocytes: 5 %
Neutrophils Absolute: 5.8 x10E3/uL (ref 1.4–7.0)
Neutrophils: 54 %
Platelets: 280 x10E3/uL (ref 150–450)
RBC: 5.2 x10E6/uL (ref 3.77–5.28)
RDW: 12.7 % (ref 11.7–15.4)
WBC: 10.7 x10E3/uL (ref 3.4–10.8)

## 2024-04-30 ENCOUNTER — Other Ambulatory Visit: Payer: Self-pay | Admitting: Family Medicine

## 2024-04-30 ENCOUNTER — Ambulatory Visit
Attending: Student in an Organized Health Care Education/Training Program | Admitting: Pharmacist Clinician (PhC)/ Clinical Pharmacy Specialist

## 2024-04-30 ENCOUNTER — Encounter: Payer: Self-pay | Admitting: Pharmacist Clinician (PhC)/ Clinical Pharmacy Specialist

## 2024-04-30 DIAGNOSIS — E785 Hyperlipidemia, unspecified: Secondary | ICD-10-CM | POA: Diagnosis present

## 2024-04-30 DIAGNOSIS — R109 Unspecified abdominal pain: Secondary | ICD-10-CM

## 2024-04-30 DIAGNOSIS — R1011 Right upper quadrant pain: Secondary | ICD-10-CM

## 2024-04-30 NOTE — Patient Instructions (Signed)
 Your Results:             Your most recent labs Goal  Total Cholesterol 189 < 200  Triglycerides 167 < 150  HDL (happy/good cholesterol) 45 > 40  LDL (lousy/bad cholesterol 115 < 55   You can try dividing the ezetimibe  tablet and take 1/2 tablet once or twice daily  When you decide if you want to move forward with Repatha, please just reach out via MyChart   Repatha (evolocumab)  Leqvio (inclisrin)   How is it given? Single use injectable pen you would use at home  An injection done by a healthcare professional in-clinic  How often do you take it? Once every 2 weeks 1st and 2nd dose 3 months apart, then every 6 months   White kind of side effects may you experience? -Flu-like symptoms for 36-48 hours following a dose, typically only for the first few doses -Injection site reactions -Bronchitis-like symptoms for 36-48 hours following a dose, typically only for the first few doses -Injection site reactions  How much will this lower your LDL? Are there other benefits? LDL reduction of up to 50-70% Also, this medication has shown that it can help to lower plaque burden when taken with a statin LDL reduction of up to 45-60%   How much will this cost? Comparable, however, there are [employer] grants and manufacture copay cards that can help reduce the price Comparable, however, there are [employer] grants and manufacture copay cards that can help reduce the price     Thank you for choosing [facility]

## 2024-04-30 NOTE — Assessment & Plan Note (Signed)
 Assessment: Patient with CAD not at LDL goal of < 70 Most recent LDL 115 on 03/06/2024 Has been compliant with moderate intensity statin:  rosuvastatin  5 mg daily  Not able to tolerate higher doses of statins secondary to myalgias Reviewed options for lowering LDL cholesterol, including ezetimibe , PCSK-9 inhibitors and inclisiran.  Discussed mechanisms of action, dosing, side effects, potential decreases in LDL cholesterol and costs.  Also reviewed potential options for patient assistance.  Plan: Patient would prefer to think about her options as well as wait until her abdominal pain issues is resolved.  She is aware that Medicaid prefers Repatha over Leqvio.  She is also aware to reach out if she decides to move forward with medication, will not need to schedule another appointment.  (Unless > 1 year)

## 2024-04-30 NOTE — Progress Notes (Signed)
 "  Office Visit    Patient Name: Donna Norton Date of Encounter: 04/30/2024  Primary Care Provider:  [name], MD Primary Cardiologist:  Arun K Thukkani, MD  Chief Complaint    Hyperlipidemia   Significant Past Medical History   CAD 3/24 CAC = 0, mild 25-49% non calcified plaque noted  HTN Controlled with lifestyle at this point  fibromyalgia On diclofenac , gabapentin   preDM 6/25  A1c 5.7     Allergies[1]  History of Present Illness    Donna Norton is a 59 y.o. female patient of Dr Wendel, in the office today to discuss options for cholesterol management.  She is currently working with her primary care practice to determine cause of abdominal pain/swelling.    Insurance Carrier:  [employer] [place]  LDL Cholesterol goal:  LDL < 70  Current Medications: rosuvastatin  5 mg daily ( myalgias with 10 mg dose)   Previously tried:  ezetimibe  - stopped 2/2 constipation  Family Hx:   both parents still living, in 32's.  Father with dementia, multiple stents; mother with htn  Social Hx: Tobacco: no Alcohol: no    Diet:  currently doing weeklong fast - only vegetables (mostly fresh).  After this will go back to lean proteins (fish, chicken breast, eggs) as well.    Accessory Clinical Findings   Lab Results  Component Value Date   CHOL 189 03/06/2024   HDL 45 03/06/2024   LDLCALC 115 (H) 03/06/2024   TRIG 167 (H) 03/06/2024   CHOLHDL 4.2 03/06/2024    Lipoprotein (a)  Date/Time Value Ref Range Status  05/02/2023 09:32 AM 322.8 (H) <75.0 nmol/L Final    Comment:    **Results verified by repeat testing** Note:  Values greater than or equal to 75.0 nmol/L may        indicate an independent risk factor for CHD,        but must be evaluated with caution when applied        to non-Caucasian populations due to the        influence of genetic factors on Lp(a) across        ethnicities.     Lab Results  Component Value Date   ALT 20 04/27/2024   AST 20  04/27/2024   ALKPHOS 102 04/27/2024   BILITOT 1.1 04/27/2024   Lab Results  Component Value Date   CREATININE 0.53 (L) 04/27/2024   BUN 10 04/27/2024   NA 141 04/27/2024   K 4.3 04/27/2024   CL 100 04/27/2024   CO2 21 04/27/2024   Lab Results  Component Value Date   HGBA1C 5.7 10/01/2023    Home Medications    Current Outpatient Medications  Medication Sig Dispense Refill   acetaminophen  (TYLENOL ) 500 MG tablet Take 1,000 mg by mouth every 6 (six) hours as needed for moderate pain (pain score 4-6).     albuterol  (PROAIR  HFA) 108 (90 Base) MCG/ACT inhaler Inhale 2 puffs into the lungs every 6 (six) hours as needed for wheezing or shortness of breath. 1 each 2   aspirin  EC 81 MG tablet Take 1 tablet (81 mg total) by mouth daily. Swallow whole. 90 tablet 3   diclofenac  (VOLTAREN ) 50 MG EC tablet TAKE 1 TABLET (50 MG TOTAL) BY MOUTH 2 (TWO) TIMES DAILY WITH A MEAL. 60 tablet 0   ezetimibe  (ZETIA ) 10 MG tablet Take 1 tablet (10 mg total) by mouth daily. 90 tablet 3   furosemide  (LASIX ) 20 MG tablet  Take 1 tablet (20 mg total) by mouth daily. 15 tablet 0   gabapentin  (NEURONTIN ) 100 MG capsule Take 1 capsule (100 mg total) by mouth 3 (three) times daily. 90 capsule 3   ibuprofen  (ADVIL ) 200 MG tablet Take 200 mg by mouth every 6 (six) hours as needed.     Milnacipran  HCl (SAVELLA  TITRATION PACK) 12.5 & 25 & 50 MG MISC Take as instructed on the medication pack. 12.5 mg once daily on day 1, then 12.5 mg twice daily on days 2-3, 25 mg twice daily on days 4-7, then to usual dosage of 50 mg twice daily thereafter (Patient not taking: Reported on 03/31/2024) 55 each 1   Multiple Vitamins-Minerals (MULTIVITAMIN WITH MINERALS) tablet Take 1 tablet by mouth daily.     omeprazole  (PRILOSEC) 40 MG capsule Take 1 capsule (40 mg total) by mouth daily. 30 capsule 3   rosuvastatin  (CRESTOR ) 5 MG tablet Take 1 tablet (5 mg total) by mouth daily. 90 tablet 3   valACYclovir (VALTREX) 500 MG tablet Take by  mouth.     No current facility-administered medications for this visit.     Assessment & Plan    Hyperlipidemia Assessment: Patient with CAD not at LDL goal of < 70 Most recent LDL 115 on 03/06/2024 Has been compliant with moderate intensity statin:  rosuvastatin  5 mg daily  Not able to tolerate higher doses of statins secondary to myalgias Reviewed options for lowering LDL cholesterol, including ezetimibe , PCSK-9 inhibitors and inclisiran.  Discussed mechanisms of action, dosing, side effects, potential decreases in LDL cholesterol and costs.  Also reviewed potential options for patient assistance.  Plan: Patient would prefer to think about her options as well as wait until her abdominal pain issues is resolved.  She is aware that Medicaid prefers Repatha over Leqvio.  She is also aware to reach out if she decides to move forward with medication, will not need to schedule another appointment.  (Unless > 1 year)   Allean Mink, PharmD CPP [facility] [address]   [place], [place] [address] [phone]  04/30/2024, 10:58 AM       [1]  Allergies Allergen Reactions   Duloxetine  Other (See Comments)    Other reaction(s): pulling her down and decreased appetitie, MADE SUICIDAL   Atorvastatin Other (See Comments)    Other reaction(s): shoulder pain reported as 10/10  Pt is able to tolerate the 5 mg dose    Ceclor [Cefaclor] Hives    Denies Airway Involvement   Crestor  [Rosuvastatin ] Other (See Comments)    Myalgias - able to tolerate 10mg    Hydrocodone  Bit-Homatrop Mbr Nausea And Vomiting and Other (See Comments)    Headaches   Percocet [Oxycodone -Acetaminophen ] Hives    CAN TAKE OXYCOONE CANNOT TAKE PERCOCET - denies airway involvement   Trazodone Hcl Other (See Comments)    Other reaction(s): dizziness   Methimazole Other (See Comments)    Liver ENZYMES RASIED    "

## 2024-04-30 NOTE — Progress Notes (Signed)
 Attempted to call Sent mychart message  RUQ US  negative Given patient's tenderness on her exam and persistent symptoms feel CT abd/pelvis W contrast is appropriate for further evaluation Placed order

## 2024-05-05 NOTE — Addendum Note (Signed)
 Addended byBETHA COWARD, [place] on: 05/05/2024 09:23 AM   Modules accepted: Orders

## 2024-05-11 ENCOUNTER — Ambulatory Visit

## 2024-05-25 ENCOUNTER — Encounter: Payer: Self-pay | Admitting: Family Medicine

## 2024-05-25 DIAGNOSIS — M1990 Unspecified osteoarthritis, unspecified site: Secondary | ICD-10-CM

## 2024-05-25 DIAGNOSIS — G629 Polyneuropathy, unspecified: Secondary | ICD-10-CM

## 2024-05-26 MED ORDER — DICLOFENAC SODIUM 50 MG PO TBEC: 50.0000 mg | DELAYED_RELEASE_TABLET | Freq: Two times a day (BID) | ORAL | 0 refills | Status: AC

## 2024-05-27 NOTE — Progress Notes (Unsigned)
 " [facility] Initial Consultation   Referring Provider [name], MD [address] [place],  [place] [address]  Primary Care Provider [name], MD  Patient Profile: Donna Norton is a 59 y.o. female who is seen in consultation in the [facility] at the request of Dr. Romelle for evaluation and management of the problem(s) noted below.  Problem List: Right upper quadrant abdominal pain Colorectal cancer screening  History of Present Illness   [name] is a 58 y.o. female with a history of ***.  Discussed the use of AI scribe software for clinical note transcription with the patient, who gave verbal consent to proceed.  History of Present Illness       GI Review of Symptoms Significant for {GIROS:50592}. Otherwise negative.  General Review of Systems  Review of systems is significant for the pertinent positives and negatives as listed per the HPI.  Full ROS is otherwise negative.  Past Medical History   Past Medical History:  Diagnosis Date   Anxiety    Arthritis    ANKLES AND FEET   Chronic cough since 2020 covid   Chronic headache    COVID 03-2019 AND 08-09-2020 RAPID HOME TEST   03-2019 SYMPTOMS X 6 MONTHS   Dyspnea    WITH EXERTION @TIMES  and talking with left lower lung per pt   Fibromyalgia    Generalized weakness    GERD (gastroesophageal reflux disease)    diet controlled   Hypertension    currently no meds, controlled, checks at home   Meningitis 2003 X 2   VIRAL   Pain    LEFT  SIDE HIP KNEE SHOULDER  and abdominal pain at times   Paresthesias    chronic   PNA (pneumonia) 03/2019   WITH COVID   Post-COVID syndrome    Sleep paralysis    OCC TROUBLE GETTING UP WHEN WAKES UP, PT CALLS THIS SLEEP PALSY   Spinal headache 2003   AFTER SPINAL TAP WITH MENIGITIS 2003   Thyroid  disease 2011-RESOLVED   HYPERTHRYOID     Past Surgical History   Past Surgical History:  Procedure Laterality Date   COLONSCOPY   05/2020   POLYPS X 2 REMOVED ONE PRECANCEROUS ONE WAS BENIGN PER PT   DILATATION & CURETTAGE/HYSTEROSCOPY WITH MYOSURE N/A 10/14/2019   Procedure: DILATATION & CURETTAGE/HYSTEROSCOPY WITH MYOSURE;  Surgeon: Rosalva Sawyer, MD;  Location: [facility];  Service: Gynecology;  Laterality: N/A;   NO PAST SURGERIES     ROBOTIC ASSISTED LAPAROSCOPIC HYSTERECTOMY AND SALPINGECTOMY Bilateral 09/06/2020   Procedure: XI ROBOTIC ASSISTED LAPAROSCOPIC HYSTERECTOMY AND BILATERAL SALPING-OOPHERECTOMY.;  Surgeon: Rosalva Sawyer, MD;  Location: [facility];  Service: Gynecology;  Laterality: Bilateral;     Allergies and Medications   Allergies[1]  @MEDSTODAY @  Family History   Family History  Problem Relation Age of Onset   Hypertension Mother    Thyroid  disease Mother    Heart disease Father        5 stents   Hypertension Father    Diabetes Father    Cancer - Cervical Sister    Allergies Child    Asthma Grandchild      Social History   Social History[2] Arnecia reports that she has never smoked. She has never used smokeless tobacco. She reports that she does not currently use alcohol. She reports that she does not use drugs.  Vital Signs and Physical Examination   There were no vitals filed for this visit. There  is no height or weight on file to calculate BMI.    General: Well developed, well nourished, no acute distress Head: Normocephalic and atraumatic Eyes: Sclerae anicteric, EOMI Ears: Normal auditory acuity Mouth: No deformities or lesions noted Lungs: Clear throughout to auscultation Heart: Regular rate and rhythm; No murmurs, rubs or bruits Abdomen: Soft, non tender and non distended. No masses, hepatosplenomegaly or hernias noted. Normal Bowel sounds Rectal: Musculoskeletal: Symmetrical with no gross deformities  Pulses:  Normal pulses noted Extremities: No edema or deformities noted Neurological: Alert oriented x 4, grossly nonfocal Psychological:   Alert and cooperative. Normal mood and affect  Review of Data  The following data was reviewed at the time of this encounter:  Laboratory Studies      Latest Ref Rng & Units 04/27/2024    3:23 PM 03/17/2024    3:15 PM 12/02/2023   10:05 AM  CBC  WBC 3.4 - 10.8 x10E3/uL 10.7  12.6  8.9   Hemoglobin 11.1 - 15.9 g/dL 84.2  84.8  85.0   Hematocrit 34.0 - 46.6 % 47.3  43.8  42.8   Platelets 150 - 450 x10E3/uL 280  256  235     Lab Results  Component Value Date   LIPASE 35 02/23/2013      Latest Ref Rng & Units 04/27/2024    3:23 PM 03/17/2024    3:15 PM 03/06/2024    9:38 AM  CMP  Glucose 70 - 99 mg/dL 79  863    BUN 8 - 27 mg/dL 10  10    Creatinine 9.42 - 1.00 mg/dL 9.46  9.36    Sodium 865 - 144 mmol/L 141  139    Potassium 3.5 - 5.2 mmol/L 4.3  4.0    Chloride 96 - 106 mmol/L 100  101    CO2 20 - 29 mmol/L 21  25    Calcium  8.7 - 10.3 mg/dL 89.9  89.9    Total Protein 6.0 - 8.5 g/dL 7.4  7.8  7.1   Total Bilirubin 0.0 - 1.2 mg/dL 1.1  0.8  1.1   Alkaline Phos 49 - 135 IU/L 102  91  87   AST 0 - 40 IU/L 20  22  19    ALT 0 - 32 IU/L 20  18  15      Lab Results  Component Value Date   VITAMINB12 608 12/26/2023   Lab Results  Component Value Date   FOLATE >20.0 12/26/2023    03/2024 H. pylori breath test negative  Imaging Studies  RUQ ultrasound 04/2024  No cholelithiasis or acute cholecystitis  GI Procedures and Studies  Colonoscopy 2022-? polyp    Clinical Impression  It is my clinical impression that Ms. Mangen is a 59 y.o. female with;  ***  Plan  *** *** *** *** ***  Planned Follow Up No follow-ups on file.  The patient or caregiver verbalized understanding of the material covered, with no barriers to understanding. All questions were answered. Patient or caregiver is agreeable with the plan outlined above.    It was a pleasure to see [name].  If you have any questions or concerns regarding this evaluation, do not hesitate to contact  me.  Inocente Hausen, MD [facility]     [1]  Allergies Allergen Reactions   Duloxetine  Other (See Comments)    Other reaction(s): pulling her down and decreased appetitie, MADE SUICIDAL   Atorvastatin Other (See Comments)    Other reaction(s): shoulder pain reported as  10/10  Pt is able to tolerate the 5 mg dose    Ceclor [Cefaclor] Hives    Denies Airway Involvement   Crestor  [Rosuvastatin ] Other (See Comments)    Myalgias - able to tolerate 10mg    Hydrocodone  Bit-Homatrop Mbr Nausea And Vomiting and Other (See Comments)    Headaches   Percocet [Oxycodone -Acetaminophen ] Hives    CAN TAKE OXYCOONE CANNOT TAKE PERCOCET - denies airway involvement   Trazodone Hcl Other (See Comments)    Other reaction(s): dizziness   Methimazole Other (See Comments)    Liver ENZYMES RASIED   [2]  Social History Tobacco Use   Smoking status: Never   Smokeless tobacco: Never  Vaping Use   Vaping status: Never Used  Substance Use Topics   Alcohol use: Not Currently   Drug use: No   "

## 2024-05-28 ENCOUNTER — Ambulatory Visit: Admitting: Pediatrics

## 2024-05-28 ENCOUNTER — Encounter: Payer: Self-pay | Admitting: Pediatrics

## 2024-05-28 VITALS — BP 119/68 | HR 88 | Ht 62.0 in | Wt 222.0 lb

## 2024-05-28 DIAGNOSIS — R109 Unspecified abdominal pain: Secondary | ICD-10-CM

## 2024-05-28 DIAGNOSIS — R1032 Left lower quadrant pain: Secondary | ICD-10-CM | POA: Diagnosis not present

## 2024-05-28 DIAGNOSIS — R1310 Dysphagia, unspecified: Secondary | ICD-10-CM | POA: Diagnosis not present

## 2024-05-28 DIAGNOSIS — K219 Gastro-esophageal reflux disease without esophagitis: Secondary | ICD-10-CM

## 2024-05-28 DIAGNOSIS — Z860100 Personal history of colon polyps, unspecified: Secondary | ICD-10-CM

## 2024-05-28 DIAGNOSIS — R1011 Right upper quadrant pain: Secondary | ICD-10-CM | POA: Diagnosis not present

## 2024-05-28 DIAGNOSIS — R140 Abdominal distension (gaseous): Secondary | ICD-10-CM

## 2024-05-28 DIAGNOSIS — R12 Heartburn: Secondary | ICD-10-CM

## 2024-05-28 DIAGNOSIS — R1031 Right lower quadrant pain: Secondary | ICD-10-CM

## 2024-05-28 DIAGNOSIS — R142 Eructation: Secondary | ICD-10-CM

## 2024-05-28 DIAGNOSIS — E739 Lactose intolerance, unspecified: Secondary | ICD-10-CM

## 2024-05-28 DIAGNOSIS — R194 Change in bowel habit: Secondary | ICD-10-CM

## 2024-05-28 MED ORDER — NA SULFATE-K SULFATE-MG SULF 17.5-3.13-1.6 GM/177ML PO SOLN: 1.0000 | ORAL | 0 refills | Status: AC

## 2024-05-28 NOTE — Patient Instructions (Signed)
 We have sent the following medications to your pharmacy for you to pick up at your convenience: Suprep   You have been scheduled for an endoscopy and colonoscopy. Please follow the written instructions given to you at your visit today.  If you use inhalers (even only as needed), please bring them with you on the day of your procedure.  DO NOT TAKE 7 DAYS PRIOR TO TEST- Trulicity (dulaglutide) Ozempic, Wegovy (semaglutide) Mounjaro, Zepbound (tirzepatide) Bydureon Bcise (exanatide extended release)  DO NOT TAKE 1 DAY PRIOR TO YOUR TEST Rybelsus (semaglutide) Adlyxin (lixisenatide) Victoza (liraglutide) Byetta (exanatide) ___________________________________________________________________________  Due to recent changes in healthcare laws, you may see the results of your imaging and laboratory studies on MyChart before your provider has had a chance to review them.  We understand that in some cases there may be results that are confusing or concerning to you. Not all laboratory results come back in the same time frame and the provider may be waiting for multiple results in order to interpret others.  Please give us  48 hours in order for your provider to thoroughly review all the results before contacting the office for clarification of your results.   Thank you for choosing me and [facility].  Dr.Nancy McGreal

## 2024-06-19 ENCOUNTER — Encounter: Admitting: Pediatrics
